# Patient Record
Sex: Male | Born: 1935 | Race: White | Hispanic: No | Marital: Married | Smoking: Former smoker
Health system: Southern US, Community
[De-identification: ages and names within clinical notes are randomized; demographics above are authoritative.]

## PROBLEM LIST (undated history)

## (undated) DIAGNOSIS — F329 Major depressive disorder, single episode, unspecified: Secondary | ICD-10-CM

## (undated) DIAGNOSIS — I10 Essential (primary) hypertension: Secondary | ICD-10-CM

## (undated) DIAGNOSIS — N184 Chronic kidney disease, stage 4 (severe): Secondary | ICD-10-CM

## (undated) DIAGNOSIS — I739 Peripheral vascular disease, unspecified: Secondary | ICD-10-CM

## (undated) DIAGNOSIS — N200 Calculus of kidney: Secondary | ICD-10-CM

## (undated) DIAGNOSIS — Z87891 Personal history of nicotine dependence: Secondary | ICD-10-CM

## (undated) DIAGNOSIS — I959 Hypotension, unspecified: Secondary | ICD-10-CM

## (undated) DIAGNOSIS — R001 Bradycardia, unspecified: Secondary | ICD-10-CM

## (undated) DIAGNOSIS — G4733 Obstructive sleep apnea (adult) (pediatric): Secondary | ICD-10-CM

## (undated) DIAGNOSIS — I714 Abdominal aortic aneurysm, without rupture: Secondary | ICD-10-CM

## (undated) DIAGNOSIS — E785 Hyperlipidemia, unspecified: Secondary | ICD-10-CM

## (undated) DIAGNOSIS — R911 Solitary pulmonary nodule: Secondary | ICD-10-CM

## (undated) DIAGNOSIS — H3530 Unspecified macular degeneration: Secondary | ICD-10-CM

## (undated) DIAGNOSIS — E291 Testicular hypofunction: Secondary | ICD-10-CM

## (undated) DIAGNOSIS — F32A Depression, unspecified: Secondary | ICD-10-CM

## (undated) DIAGNOSIS — I2510 Atherosclerotic heart disease of native coronary artery without angina pectoris: Secondary | ICD-10-CM

## (undated) DIAGNOSIS — Z9989 Dependence on other enabling machines and devices: Secondary | ICD-10-CM

## (undated) DIAGNOSIS — Z85828 Personal history of other malignant neoplasm of skin: Secondary | ICD-10-CM

## (undated) DIAGNOSIS — L97909 Non-pressure chronic ulcer of unspecified part of unspecified lower leg with unspecified severity: Secondary | ICD-10-CM

## (undated) DIAGNOSIS — D3000 Benign neoplasm of unspecified kidney: Secondary | ICD-10-CM

## (undated) HISTORY — PX: LUMBAR DISC SURGERY: SHX700

## (undated) HISTORY — PX: CORONARY STENT PLACEMENT: SHX1402

## (undated) HISTORY — DX: Calculus of kidney: N20.0

## (undated) HISTORY — DX: Hyperlipidemia, unspecified: E78.5

## (undated) HISTORY — DX: Essential (primary) hypertension: I10

## (undated) HISTORY — PX: TRANSURETHRAL RESECTION OF PROSTATE: SHX73

## (undated) HISTORY — DX: Personal history of other malignant neoplasm of skin: Z85.828

## (undated) HISTORY — DX: Personal history of nicotine dependence: Z87.891

## (undated) HISTORY — PX: BACK SURGERY: SHX140

## (undated) HISTORY — PX: PROSTATE SURGERY: SHX751

## (undated) HISTORY — PX: ABDOMINAL AORTIC ANEURYSM REPAIR: SUR1152

## (undated) HISTORY — DX: Non-pressure chronic ulcer of unspecified part of unspecified lower leg with unspecified severity: L97.909

## (undated) HISTORY — DX: Peripheral vascular disease, unspecified: I73.9

## (undated) HISTORY — DX: Abdominal aortic aneurysm, without rupture: I71.4

## (undated) HISTORY — DX: Major depressive disorder, single episode, unspecified: F32.9

## (undated) HISTORY — PX: CHOLECYSTECTOMY: SHX55

## (undated) HISTORY — DX: Benign neoplasm of unspecified kidney: D30.00

## (undated) HISTORY — DX: Unspecified macular degeneration: H35.30

## (undated) HISTORY — DX: Testicular hypofunction: E29.1

## (undated) HISTORY — DX: Depression, unspecified: F32.A

## (undated) HISTORY — DX: Atherosclerotic heart disease of native coronary artery without angina pectoris: I25.10

## (undated) HISTORY — DX: Solitary pulmonary nodule: R91.1

## (undated) HISTORY — DX: Obstructive sleep apnea (adult) (pediatric): G47.33

---

## 1988-04-30 HISTORY — PX: CORONARY ARTERY BYPASS GRAFT: SHX141

## 1998-03-10 ENCOUNTER — Other Ambulatory Visit: Admission: RE | Admit: 1998-03-10 | Discharge: 1998-03-10 | Payer: Self-pay | Admitting: Urology

## 1998-03-17 ENCOUNTER — Other Ambulatory Visit: Admission: RE | Admit: 1998-03-17 | Discharge: 1998-03-17 | Payer: Self-pay | Admitting: Urology

## 1998-04-05 ENCOUNTER — Ambulatory Visit (HOSPITAL_BASED_OUTPATIENT_CLINIC_OR_DEPARTMENT_OTHER): Admission: RE | Admit: 1998-04-05 | Discharge: 1998-04-05 | Payer: Self-pay | Admitting: Urology

## 1998-04-05 ENCOUNTER — Encounter: Payer: Self-pay | Admitting: Urology

## 1998-07-08 ENCOUNTER — Ambulatory Visit (HOSPITAL_BASED_OUTPATIENT_CLINIC_OR_DEPARTMENT_OTHER): Admission: RE | Admit: 1998-07-08 | Discharge: 1998-07-08 | Payer: Self-pay | Admitting: Urology

## 1999-01-23 ENCOUNTER — Other Ambulatory Visit: Admission: RE | Admit: 1999-01-23 | Discharge: 1999-01-24 | Payer: Self-pay | Admitting: Urology

## 1999-02-06 ENCOUNTER — Other Ambulatory Visit: Admission: RE | Admit: 1999-02-06 | Discharge: 1999-02-06 | Payer: Self-pay | Admitting: Urology

## 1999-05-02 ENCOUNTER — Other Ambulatory Visit: Admission: RE | Admit: 1999-05-02 | Discharge: 1999-05-02 | Payer: Self-pay | Admitting: Urology

## 1999-05-03 ENCOUNTER — Other Ambulatory Visit: Admission: RE | Admit: 1999-05-03 | Discharge: 1999-05-03 | Payer: Self-pay | Admitting: Urology

## 1999-06-13 ENCOUNTER — Encounter: Payer: Self-pay | Admitting: Urology

## 1999-06-13 ENCOUNTER — Encounter (INDEPENDENT_AMBULATORY_CARE_PROVIDER_SITE_OTHER): Payer: Self-pay | Admitting: *Deleted

## 1999-06-13 ENCOUNTER — Ambulatory Visit (HOSPITAL_BASED_OUTPATIENT_CLINIC_OR_DEPARTMENT_OTHER): Admission: RE | Admit: 1999-06-13 | Discharge: 1999-06-13 | Payer: Self-pay | Admitting: Urology

## 2000-04-30 DIAGNOSIS — I714 Abdominal aortic aneurysm, without rupture, unspecified: Secondary | ICD-10-CM

## 2000-04-30 HISTORY — DX: Abdominal aortic aneurysm, without rupture: I71.4

## 2000-04-30 HISTORY — DX: Abdominal aortic aneurysm, without rupture, unspecified: I71.40

## 2000-05-02 ENCOUNTER — Other Ambulatory Visit: Admission: RE | Admit: 2000-05-02 | Discharge: 2000-05-02 | Payer: Self-pay | Admitting: Urology

## 2000-05-09 ENCOUNTER — Other Ambulatory Visit: Admission: RE | Admit: 2000-05-09 | Discharge: 2000-05-09 | Payer: Self-pay | Admitting: Internal Medicine

## 2000-06-24 ENCOUNTER — Encounter (INDEPENDENT_AMBULATORY_CARE_PROVIDER_SITE_OTHER): Payer: Self-pay | Admitting: Specialist

## 2000-06-24 ENCOUNTER — Ambulatory Visit (HOSPITAL_COMMUNITY): Admission: RE | Admit: 2000-06-24 | Discharge: 2000-06-24 | Payer: Self-pay | Admitting: Gastroenterology

## 2001-02-07 ENCOUNTER — Encounter: Payer: Self-pay | Admitting: Cardiology

## 2001-02-07 ENCOUNTER — Ambulatory Visit (HOSPITAL_COMMUNITY): Admission: RE | Admit: 2001-02-07 | Discharge: 2001-02-07 | Payer: Self-pay | Admitting: Cardiology

## 2001-02-10 ENCOUNTER — Ambulatory Visit (HOSPITAL_COMMUNITY): Admission: RE | Admit: 2001-02-10 | Discharge: 2001-02-11 | Payer: Self-pay | Admitting: Cardiology

## 2001-02-11 ENCOUNTER — Encounter: Payer: Self-pay | Admitting: Cardiology

## 2001-06-01 ENCOUNTER — Ambulatory Visit (HOSPITAL_BASED_OUTPATIENT_CLINIC_OR_DEPARTMENT_OTHER): Admission: RE | Admit: 2001-06-01 | Discharge: 2001-06-01 | Payer: Self-pay | Admitting: Internal Medicine

## 2001-12-04 ENCOUNTER — Ambulatory Visit (HOSPITAL_BASED_OUTPATIENT_CLINIC_OR_DEPARTMENT_OTHER): Admission: RE | Admit: 2001-12-04 | Discharge: 2001-12-04 | Payer: Self-pay | Admitting: Plastic Surgery

## 2001-12-20 ENCOUNTER — Inpatient Hospital Stay (HOSPITAL_COMMUNITY): Admission: RE | Admit: 2001-12-20 | Discharge: 2001-12-26 | Payer: Self-pay | Admitting: Plastic Surgery

## 2003-06-08 ENCOUNTER — Encounter: Admission: RE | Admit: 2003-06-08 | Discharge: 2003-06-08 | Payer: Self-pay | Admitting: Internal Medicine

## 2003-06-16 ENCOUNTER — Encounter: Admission: RE | Admit: 2003-06-16 | Discharge: 2003-06-16 | Payer: Self-pay | Admitting: Sports Medicine

## 2003-06-30 ENCOUNTER — Ambulatory Visit (HOSPITAL_COMMUNITY): Admission: RE | Admit: 2003-06-30 | Discharge: 2003-06-30 | Payer: Self-pay | Admitting: Cardiology

## 2003-07-05 ENCOUNTER — Encounter (INDEPENDENT_AMBULATORY_CARE_PROVIDER_SITE_OTHER): Payer: Self-pay | Admitting: *Deleted

## 2003-07-05 ENCOUNTER — Inpatient Hospital Stay (HOSPITAL_COMMUNITY): Admission: RE | Admit: 2003-07-05 | Discharge: 2003-07-12 | Payer: Self-pay | Admitting: Vascular Surgery

## 2003-07-18 ENCOUNTER — Inpatient Hospital Stay (HOSPITAL_COMMUNITY): Admission: AD | Admit: 2003-07-18 | Discharge: 2003-07-19 | Payer: Self-pay | Admitting: *Deleted

## 2003-09-07 ENCOUNTER — Ambulatory Visit (HOSPITAL_COMMUNITY): Admission: RE | Admit: 2003-09-07 | Discharge: 2003-09-08 | Payer: Self-pay | Admitting: Cardiology

## 2004-02-28 ENCOUNTER — Encounter (HOSPITAL_COMMUNITY): Admission: RE | Admit: 2004-02-28 | Discharge: 2004-05-28 | Payer: Self-pay | Admitting: Cardiology

## 2004-03-03 ENCOUNTER — Ambulatory Visit: Payer: Self-pay | Admitting: Internal Medicine

## 2004-04-06 ENCOUNTER — Ambulatory Visit: Payer: Self-pay | Admitting: Internal Medicine

## 2004-05-29 ENCOUNTER — Encounter (HOSPITAL_COMMUNITY): Admission: RE | Admit: 2004-05-29 | Discharge: 2004-08-27 | Payer: Self-pay | Admitting: Cardiology

## 2004-07-17 ENCOUNTER — Ambulatory Visit: Payer: Self-pay | Admitting: Internal Medicine

## 2004-08-29 ENCOUNTER — Ambulatory Visit: Payer: Self-pay | Admitting: Internal Medicine

## 2004-08-30 IMAGING — CR DG CHEST 1V PORT
1 series · 1 of 1 positions shown · non-contrast
Comparison: 07/05/03 ([DATE])
 Endotracheal tube and nasogastric tube have been removed.

CLINICAL DATA: Post-op day #1, status post CABG.
 AP PORTABLE CHEST ?07/06/03 ([DATE])

[view not recorded]
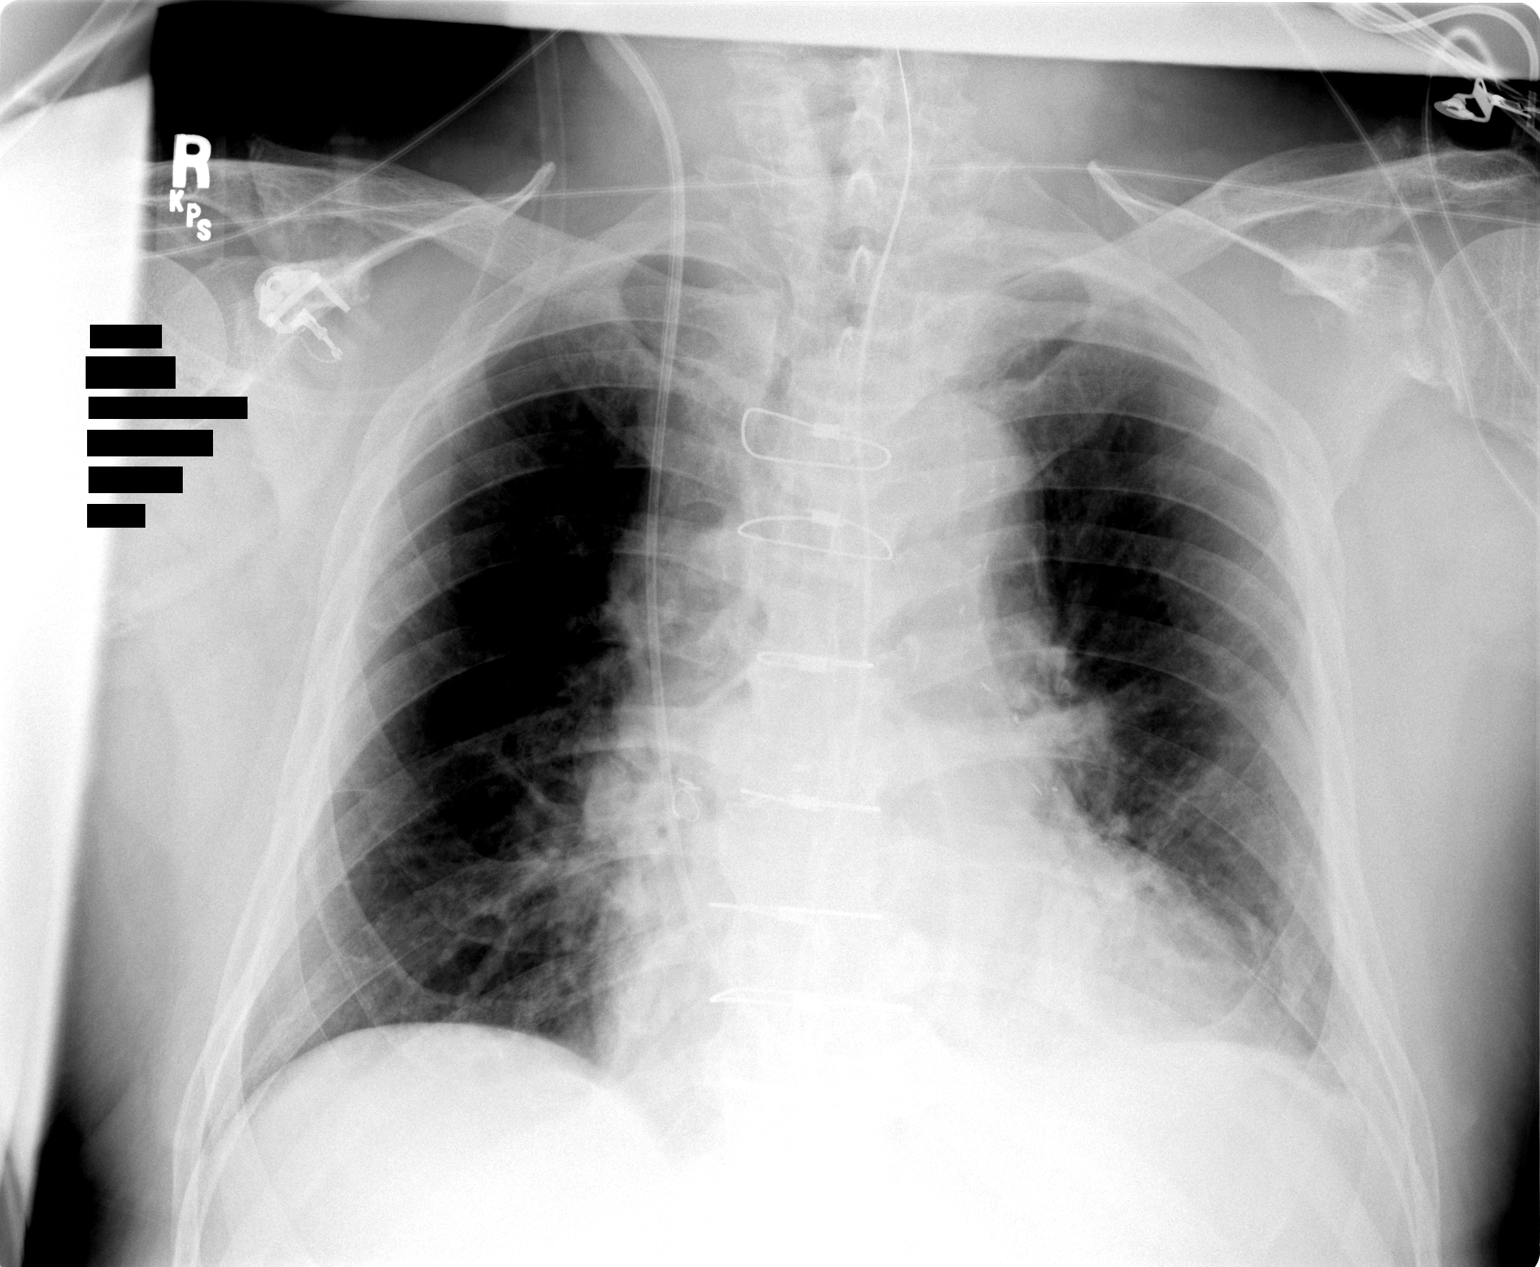

[1 of 1 positions shown; findings below may reference images not displayed]

Slight better aeration, left base.  The remainder of findings are unchanged.  
 IMPRESSION 
 Endotracheal tube and nasogastric tube have been removed. 
 Slight improvement in aeration, left base.

## 2004-08-31 IMAGING — CR DG CHEST 1V PORT
1 series · 1 of 1 positions shown · non-contrast
Comparison: 07/06/03.

CLINICAL DATA: Abdominal aortic aneurysm. 
 PORTABLE CHEST ([DATE] HOURS)

[view not recorded]
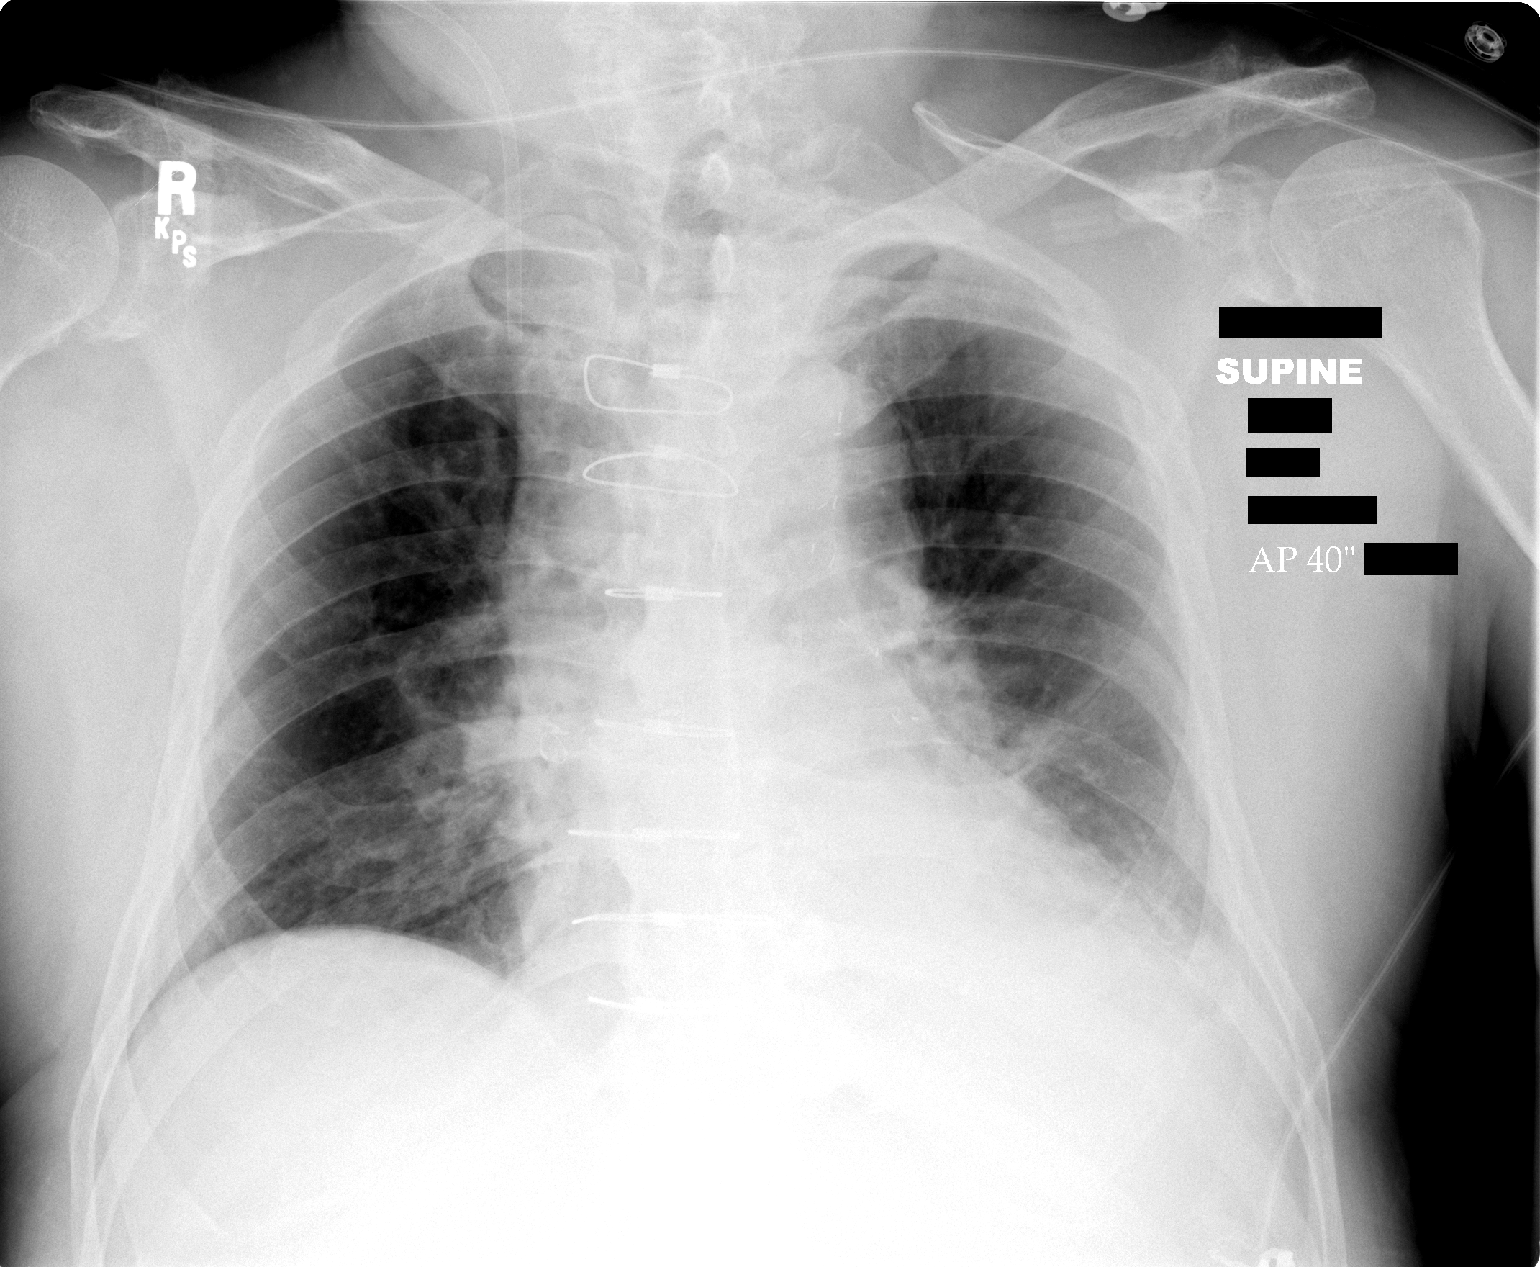

[1 of 1 positions shown; findings below may reference images not displayed]

FINDINGS: The NG tube and Swan-Ganz catheter have been removed with the introducer left in place.  The heart remains borderline enlarged.  Left basilar atelectasis is worse.  No pneumothoraces are seen.  A small left pleural effusion is likely present and unchanged.
 IMPRESSION
 1.  Removal of the NG tube and Swan-Ganz catheter.
 2.  Worsening atelectasis at the left base.

## 2004-09-11 ENCOUNTER — Ambulatory Visit: Payer: Self-pay | Admitting: Internal Medicine

## 2004-09-14 ENCOUNTER — Ambulatory Visit: Payer: Self-pay | Admitting: Cardiology

## 2004-09-21 ENCOUNTER — Ambulatory Visit: Payer: Self-pay | Admitting: Internal Medicine

## 2004-12-22 ENCOUNTER — Ambulatory Visit: Payer: Self-pay | Admitting: Internal Medicine

## 2005-02-09 ENCOUNTER — Ambulatory Visit: Payer: Self-pay | Admitting: Internal Medicine

## 2005-05-23 ENCOUNTER — Ambulatory Visit: Payer: Self-pay | Admitting: Cardiology

## 2005-05-23 ENCOUNTER — Inpatient Hospital Stay (HOSPITAL_COMMUNITY): Admission: AD | Admit: 2005-05-23 | Discharge: 2005-05-25 | Payer: Self-pay | Admitting: Cardiology

## 2005-06-04 ENCOUNTER — Ambulatory Visit: Payer: Self-pay | Admitting: Internal Medicine

## 2005-08-10 ENCOUNTER — Ambulatory Visit: Payer: Self-pay | Admitting: Internal Medicine

## 2005-10-02 ENCOUNTER — Ambulatory Visit: Payer: Self-pay | Admitting: Cardiology

## 2005-11-05 ENCOUNTER — Ambulatory Visit: Payer: Self-pay

## 2005-11-05 ENCOUNTER — Ambulatory Visit: Payer: Self-pay | Admitting: Cardiology

## 2005-11-13 ENCOUNTER — Ambulatory Visit: Payer: Self-pay | Admitting: Cardiology

## 2005-11-13 ENCOUNTER — Ambulatory Visit (HOSPITAL_COMMUNITY): Admission: RE | Admit: 2005-11-13 | Discharge: 2005-11-14 | Payer: Self-pay | Admitting: Cardiology

## 2005-11-27 ENCOUNTER — Ambulatory Visit: Payer: Self-pay | Admitting: Cardiology

## 2005-12-24 ENCOUNTER — Ambulatory Visit: Payer: Self-pay | Admitting: Cardiology

## 2006-01-01 ENCOUNTER — Encounter (HOSPITAL_COMMUNITY): Admission: RE | Admit: 2006-01-01 | Discharge: 2006-04-01 | Payer: Self-pay | Admitting: Cardiology

## 2006-01-30 ENCOUNTER — Ambulatory Visit: Payer: Self-pay | Admitting: Cardiology

## 2006-02-04 ENCOUNTER — Ambulatory Visit: Payer: Self-pay | Admitting: Internal Medicine

## 2006-02-11 ENCOUNTER — Ambulatory Visit: Payer: Self-pay

## 2006-02-11 ENCOUNTER — Ambulatory Visit: Payer: Self-pay | Admitting: Cardiology

## 2006-02-12 ENCOUNTER — Ambulatory Visit: Payer: Self-pay | Admitting: Cardiology

## 2006-02-18 ENCOUNTER — Ambulatory Visit: Payer: Self-pay | Admitting: Internal Medicine

## 2006-04-01 ENCOUNTER — Ambulatory Visit: Payer: Self-pay | Admitting: Internal Medicine

## 2006-04-02 ENCOUNTER — Encounter (HOSPITAL_COMMUNITY): Admission: RE | Admit: 2006-04-02 | Discharge: 2006-07-01 | Payer: Self-pay | Admitting: Cardiology

## 2006-04-26 ENCOUNTER — Ambulatory Visit: Payer: Self-pay | Admitting: Cardiology

## 2006-05-03 ENCOUNTER — Ambulatory Visit: Payer: Self-pay

## 2006-05-09 ENCOUNTER — Ambulatory Visit: Payer: Self-pay | Admitting: Cardiology

## 2006-05-09 LAB — CONVERTED CEMR LAB
Calcium: 9.5 mg/dL (ref 8.4–10.5)
Chloride: 101 meq/L (ref 96–112)
GFR calc non Af Amer: 33 mL/min
Glucose, Bld: 89 mg/dL (ref 70–99)
Hemoglobin: 16.1 g/dL (ref 13.0–17.0)
INR: 1 (ref 0.9–2.0)
Prothrombin Time: 12.4 s (ref 10.0–14.0)
RBC: 5.56 M/uL (ref 4.22–5.81)
RDW: 15.3 % — ABNORMAL HIGH (ref 11.5–14.6)
aPTT: 32.8 s (ref 26.5–36.5)

## 2006-05-10 ENCOUNTER — Ambulatory Visit: Payer: Self-pay | Admitting: Internal Medicine

## 2006-05-13 ENCOUNTER — Ambulatory Visit: Payer: Self-pay | Admitting: Cardiology

## 2006-05-13 LAB — CONVERTED CEMR LAB
BUN: 30 mg/dL — ABNORMAL HIGH (ref 6–23)
Calcium: 9.1 mg/dL (ref 8.4–10.5)
Chloride: 104 meq/L (ref 96–112)
GFR calc non Af Amer: 39 mL/min

## 2006-05-15 ENCOUNTER — Ambulatory Visit: Payer: Self-pay | Admitting: Cardiology

## 2006-05-15 ENCOUNTER — Inpatient Hospital Stay (HOSPITAL_BASED_OUTPATIENT_CLINIC_OR_DEPARTMENT_OTHER): Admission: RE | Admit: 2006-05-15 | Discharge: 2006-05-15 | Payer: Self-pay | Admitting: Cardiology

## 2006-05-17 ENCOUNTER — Ambulatory Visit: Payer: Self-pay | Admitting: Cardiology

## 2006-05-17 LAB — CONVERTED CEMR LAB
BUN: 23 mg/dL (ref 6–23)
Calcium: 8.9 mg/dL (ref 8.4–10.5)
Chloride: 100 meq/L (ref 96–112)
Creatinine, Ser: 1.8 mg/dL — ABNORMAL HIGH (ref 0.4–1.5)

## 2006-06-03 ENCOUNTER — Ambulatory Visit: Payer: Self-pay | Admitting: Cardiology

## 2006-06-10 ENCOUNTER — Ambulatory Visit: Payer: Self-pay | Admitting: Internal Medicine

## 2006-07-02 ENCOUNTER — Encounter (HOSPITAL_COMMUNITY): Admission: RE | Admit: 2006-07-02 | Discharge: 2006-09-30 | Payer: Self-pay | Admitting: Cardiology

## 2006-07-08 ENCOUNTER — Ambulatory Visit: Payer: Self-pay | Admitting: Internal Medicine

## 2006-07-17 ENCOUNTER — Ambulatory Visit: Payer: Self-pay | Admitting: Cardiology

## 2006-07-17 LAB — CONVERTED CEMR LAB
Bilirubin, Direct: 0.2 mg/dL (ref 0.0–0.3)
Cholesterol: 148 mg/dL (ref 0–200)
HDL: 29.8 mg/dL — ABNORMAL LOW (ref >39.0)
LDL Cholesterol: 78 mg/dL (ref 0–99)
Total Bilirubin: 0.9 mg/dL (ref 0.3–1.2)
Total CHOL/HDL Ratio: 5
Total Protein: 6.1 g/dL (ref 6.0–8.3)

## 2006-08-26 ENCOUNTER — Ambulatory Visit: Payer: Self-pay | Admitting: Internal Medicine

## 2006-08-26 LAB — CONVERTED CEMR LAB
ALT: 13 units/L (ref 0–40)
AST: 24 units/L (ref 0–37)
Albumin: 3.7 g/dL (ref 3.5–5.2)
Alkaline Phosphatase: 76 units/L (ref 39–117)
BUN: 28 mg/dL — ABNORMAL HIGH (ref 6–23)
Bilirubin, Direct: 0.2 mg/dL (ref 0.0–0.3)
CO2: 32 meq/L (ref 19–32)
Calcium: 9.2 mg/dL (ref 8.4–10.5)
Chloride: 104 meq/L (ref 96–112)
Cholesterol: 176 mg/dL (ref 0–200)
Creatinine, Ser: 1.7 mg/dL — ABNORMAL HIGH (ref 0.4–1.5)
Direct LDL: 90 mg/dL
GFR calc Af Amer: 51 mL/min
GFR calc non Af Amer: 42 mL/min
Glucose, Bld: 120 mg/dL — ABNORMAL HIGH (ref 70–99)
HDL: 30.7 mg/dL — ABNORMAL LOW (ref >39.0)
Potassium: 4.2 meq/L (ref 3.5–5.1)
Sodium: 142 meq/L (ref 135–145)
Testosterone: 210.51 ng/dL — ABNORMAL LOW (ref 350.00–890)
Total Bilirubin: 1 mg/dL (ref 0.3–1.2)
Total CHOL/HDL Ratio: 5.7
Total Protein: 6.4 g/dL (ref 6.0–8.3)
Triglycerides: 431 mg/dL (ref 0–149)
VLDL: 86 mg/dL — ABNORMAL HIGH (ref 0–40)

## 2006-09-30 ENCOUNTER — Encounter (HOSPITAL_COMMUNITY): Admission: RE | Admit: 2006-09-30 | Discharge: 2006-12-29 | Payer: Self-pay | Admitting: Cardiology

## 2006-10-23 ENCOUNTER — Ambulatory Visit: Payer: Self-pay | Admitting: Internal Medicine

## 2006-10-23 LAB — CONVERTED CEMR LAB
ALT: 19 units/L (ref 0–53)
AST: 25 units/L (ref 0–37)
Albumin: 3.7 g/dL (ref 3.5–5.2)
Alkaline Phosphatase: 73 units/L (ref 39–117)
BUN: 31 mg/dL — ABNORMAL HIGH (ref 6–23)
Bilirubin, Direct: 0.1 mg/dL (ref 0.0–0.3)
CO2: 30 meq/L (ref 19–32)
Calcium: 9.2 mg/dL (ref 8.4–10.5)
Chloride: 100 meq/L (ref 96–112)
Cholesterol: 197 mg/dL (ref 0–200)
Creatinine, Ser: 2.2 mg/dL — ABNORMAL HIGH (ref 0.4–1.5)
Direct LDL: 110.4 mg/dL
GFR calc Af Amer: 38 mL/min
GFR calc non Af Amer: 31 mL/min
Glucose, Bld: 100 mg/dL — ABNORMAL HIGH (ref 70–99)
HDL: 29.7 mg/dL — ABNORMAL LOW (ref >39.0)
Potassium: 4.1 meq/L (ref 3.5–5.1)
Sodium: 143 meq/L (ref 135–145)
Testosterone: 461.77 ng/dL (ref 350.00–890)
Total Bilirubin: 1 mg/dL (ref 0.3–1.2)
Total CHOL/HDL Ratio: 6.6
Total Protein: 6 g/dL (ref 6.0–8.3)
Triglycerides: 288 mg/dL (ref 0–149)
VLDL: 58 mg/dL — ABNORMAL HIGH (ref 0–40)

## 2006-10-28 DIAGNOSIS — I739 Peripheral vascular disease, unspecified: Secondary | ICD-10-CM

## 2006-10-28 DIAGNOSIS — I714 Abdominal aortic aneurysm, without rupture, unspecified: Secondary | ICD-10-CM | POA: Insufficient documentation

## 2006-10-28 DIAGNOSIS — I2510 Atherosclerotic heart disease of native coronary artery without angina pectoris: Secondary | ICD-10-CM | POA: Insufficient documentation

## 2006-10-28 DIAGNOSIS — M109 Gout, unspecified: Secondary | ICD-10-CM | POA: Insufficient documentation

## 2006-10-28 DIAGNOSIS — I10 Essential (primary) hypertension: Secondary | ICD-10-CM

## 2006-10-28 DIAGNOSIS — G4730 Sleep apnea, unspecified: Secondary | ICD-10-CM | POA: Insufficient documentation

## 2006-10-28 DIAGNOSIS — N259 Disorder resulting from impaired renal tubular function, unspecified: Secondary | ICD-10-CM | POA: Insufficient documentation

## 2006-11-22 ENCOUNTER — Telehealth (INDEPENDENT_AMBULATORY_CARE_PROVIDER_SITE_OTHER): Payer: Self-pay | Admitting: *Deleted

## 2006-11-25 ENCOUNTER — Ambulatory Visit: Payer: Self-pay | Admitting: Internal Medicine

## 2006-11-25 DIAGNOSIS — E291 Testicular hypofunction: Secondary | ICD-10-CM | POA: Insufficient documentation

## 2006-11-25 HISTORY — DX: Testicular hypofunction: E29.1

## 2006-11-26 ENCOUNTER — Telehealth: Payer: Self-pay | Admitting: Internal Medicine

## 2006-12-02 ENCOUNTER — Ambulatory Visit: Payer: Self-pay | Admitting: Cardiology

## 2006-12-16 ENCOUNTER — Telehealth: Payer: Self-pay | Admitting: Internal Medicine

## 2006-12-30 ENCOUNTER — Encounter (HOSPITAL_COMMUNITY): Admission: RE | Admit: 2006-12-30 | Discharge: 2007-03-30 | Payer: Self-pay | Admitting: Cardiology

## 2006-12-31 ENCOUNTER — Ambulatory Visit: Payer: Self-pay | Admitting: Internal Medicine

## 2007-01-02 LAB — CONVERTED CEMR LAB
Albumin: 4 g/dL (ref 3.5–5.2)
BUN: 24 mg/dL — ABNORMAL HIGH (ref 6–23)
Basophils Absolute: 0 10*3/uL (ref 0.0–0.1)
Basophils Relative: 0 % (ref 0.0–1.0)
CO2: 31 meq/L (ref 19–32)
Calcium: 10 mg/dL (ref 8.4–10.5)
Chloride: 108 meq/L (ref 96–112)
Creatinine, Ser: 1.4 mg/dL (ref 0.4–1.5)
Eosinophils Absolute: 0.3 10*3/uL (ref 0.0–0.6)
Eosinophils Relative: 3.8 % (ref 0.0–5.0)
GFR calc Af Amer: 64 mL/min
GFR calc non Af Amer: 53 mL/min
Glucose, Bld: 83 mg/dL (ref 70–99)
HCT: 50.6 % (ref 39.0–52.0)
Hemoglobin: 17.1 g/dL — ABNORMAL HIGH (ref 13.0–17.0)
Lymphocytes Relative: 20.7 % (ref 12.0–46.0)
MCHC: 33.9 g/dL (ref 30.0–36.0)
MCV: 90.8 fL (ref 78.0–100.0)
Monocytes Absolute: 1.1 10*3/uL — ABNORMAL HIGH (ref 0.2–0.7)
Monocytes Relative: 13.6 % — ABNORMAL HIGH (ref 3.0–11.0)
Neutro Abs: 4.9 10*3/uL (ref 1.4–7.7)
Neutrophils Relative %: 61.9 % (ref 43.0–77.0)
Phosphorus: 4.3 mg/dL (ref 2.3–4.6)
Platelets: 191 10*3/uL (ref 150–400)
Potassium: 5.3 meq/L — ABNORMAL HIGH (ref 3.5–5.1)
RBC: 5.57 M/uL (ref 4.22–5.81)
RDW: 14.9 % — ABNORMAL HIGH (ref 11.5–14.6)
Sodium: 147 meq/L — ABNORMAL HIGH (ref 135–145)
TSH: 2.83 microintl units/mL (ref 0.35–5.50)
WBC: 7.9 10*3/uL (ref 4.5–10.5)

## 2007-02-12 ENCOUNTER — Ambulatory Visit: Payer: Self-pay | Admitting: Internal Medicine

## 2007-02-17 ENCOUNTER — Encounter: Payer: Self-pay | Admitting: Internal Medicine

## 2007-03-11 ENCOUNTER — Ambulatory Visit: Payer: Self-pay | Admitting: Internal Medicine

## 2007-03-31 ENCOUNTER — Encounter (HOSPITAL_COMMUNITY): Admission: RE | Admit: 2007-03-31 | Discharge: 2007-04-29 | Payer: Self-pay | Admitting: Cardiology

## 2007-04-02 ENCOUNTER — Telehealth (INDEPENDENT_AMBULATORY_CARE_PROVIDER_SITE_OTHER): Payer: Self-pay | Admitting: *Deleted

## 2007-04-03 ENCOUNTER — Ambulatory Visit: Payer: Self-pay | Admitting: Internal Medicine

## 2007-04-04 LAB — CONVERTED CEMR LAB
ALT: 15 units/L (ref 0–53)
AST: 23 units/L (ref 0–37)
Albumin: 3.6 g/dL (ref 3.5–5.2)
Alkaline Phosphatase: 92 units/L (ref 39–117)
BUN: 17 mg/dL (ref 6–23)
Bilirubin, Direct: 0.2 mg/dL (ref 0.0–0.3)
CO2: 32 meq/L (ref 19–32)
Calcium: 9.4 mg/dL (ref 8.4–10.5)
Chloride: 104 meq/L (ref 96–112)
Cholesterol: 169 mg/dL (ref 0–200)
Creatinine, Ser: 1.5 mg/dL (ref 0.4–1.5)
Direct LDL: 108.6 mg/dL
GFR calc Af Amer: 59 mL/min
GFR calc non Af Amer: 48 mL/min
Glucose, Bld: 85 mg/dL (ref 70–99)
HDL: 21.7 mg/dL — ABNORMAL LOW (ref >39.0)
Potassium: 4.7 meq/L (ref 3.5–5.1)
Sodium: 140 meq/L (ref 135–145)
Testosterone: 219.56 ng/dL — ABNORMAL LOW (ref 350.00–890)
Total Bilirubin: 0.9 mg/dL (ref 0.3–1.2)
Total CHOL/HDL Ratio: 7.8
Total Protein: 5.8 g/dL — ABNORMAL LOW (ref 6.0–8.3)
Triglycerides: 227 mg/dL (ref 0–149)
VLDL: 45 mg/dL — ABNORMAL HIGH (ref 0–40)

## 2007-04-30 ENCOUNTER — Telehealth: Payer: Self-pay | Admitting: Internal Medicine

## 2007-04-30 ENCOUNTER — Encounter: Payer: Self-pay | Admitting: Internal Medicine

## 2007-05-02 ENCOUNTER — Telehealth: Payer: Self-pay | Admitting: Internal Medicine

## 2007-06-03 ENCOUNTER — Ambulatory Visit: Payer: Self-pay | Admitting: Cardiology

## 2007-06-05 ENCOUNTER — Ambulatory Visit: Payer: Self-pay | Admitting: Internal Medicine

## 2007-06-05 DIAGNOSIS — E785 Hyperlipidemia, unspecified: Secondary | ICD-10-CM

## 2007-06-06 LAB — CONVERTED CEMR LAB
ALT: 17 units/L (ref 0–53)
AST: 24 units/L (ref 0–37)
Bilirubin, Direct: 0.2 mg/dL (ref 0.0–0.3)
Cholesterol: 155 mg/dL (ref 0–200)
Total Bilirubin: 1.5 mg/dL — ABNORMAL HIGH (ref 0.3–1.2)
Total Protein: 6.1 g/dL (ref 6.0–8.3)

## 2007-06-16 ENCOUNTER — Telehealth: Payer: Self-pay | Admitting: Internal Medicine

## 2007-07-18 ENCOUNTER — Telehealth: Payer: Self-pay | Admitting: Internal Medicine

## 2007-08-20 ENCOUNTER — Ambulatory Visit: Payer: Self-pay | Admitting: Internal Medicine

## 2007-08-28 ENCOUNTER — Telehealth: Payer: Self-pay | Admitting: Internal Medicine

## 2007-10-03 ENCOUNTER — Ambulatory Visit: Payer: Self-pay | Admitting: Internal Medicine

## 2007-10-03 DIAGNOSIS — H3530 Unspecified macular degeneration: Secondary | ICD-10-CM | POA: Insufficient documentation

## 2007-10-03 LAB — CONVERTED CEMR LAB
Nitrite: NEGATIVE
Specific Gravity, Urine: 1.02
Urobilinogen, UA: NEGATIVE
pH: 5.5

## 2007-10-08 LAB — CONVERTED CEMR LAB
ALT: 17 units/L (ref 0–53)
Bilirubin, Direct: 0.1 mg/dL (ref 0.0–0.3)
CO2: 32 meq/L (ref 19–32)
Calcium: 9.1 mg/dL (ref 8.4–10.5)
Chloride: 99 meq/L (ref 96–112)
GFR calc non Af Amer: 48 mL/min
Sodium: 140 meq/L (ref 135–145)
Total Bilirubin: 0.8 mg/dL (ref 0.3–1.2)
Total CHOL/HDL Ratio: 4.9

## 2007-10-27 ENCOUNTER — Ambulatory Visit: Payer: Self-pay | Admitting: Internal Medicine

## 2007-11-05 ENCOUNTER — Encounter: Payer: Self-pay | Admitting: Internal Medicine

## 2007-11-12 ENCOUNTER — Encounter: Payer: Self-pay | Admitting: Internal Medicine

## 2007-12-16 ENCOUNTER — Ambulatory Visit: Payer: Self-pay | Admitting: Internal Medicine

## 2007-12-23 ENCOUNTER — Ambulatory Visit: Payer: Self-pay | Admitting: Cardiology

## 2007-12-23 LAB — CONVERTED CEMR LAB
CO2: 30 meq/L (ref 19–32)
Calcium: 8.8 mg/dL (ref 8.4–10.5)
Chloride: 108 meq/L (ref 96–112)
Glucose, Bld: 91 mg/dL (ref 70–99)
Sodium: 142 meq/L (ref 135–145)

## 2008-01-01 ENCOUNTER — Ambulatory Visit: Payer: Self-pay | Admitting: Cardiology

## 2008-01-01 LAB — CONVERTED CEMR LAB
BUN: 17 mg/dL (ref 6–23)
CO2: 28 meq/L (ref 19–32)
Chloride: 103 meq/L (ref 96–112)
GFR calc Af Amer: 54 mL/min
Glucose, Bld: 133 mg/dL — ABNORMAL HIGH (ref 70–99)
Potassium: 4.2 meq/L (ref 3.5–5.1)

## 2008-02-10 ENCOUNTER — Ambulatory Visit: Payer: Self-pay | Admitting: Internal Medicine

## 2008-02-10 DIAGNOSIS — N320 Bladder-neck obstruction: Secondary | ICD-10-CM

## 2008-02-18 ENCOUNTER — Encounter: Payer: Self-pay | Admitting: Internal Medicine

## 2008-03-02 ENCOUNTER — Telehealth: Payer: Self-pay | Admitting: Internal Medicine

## 2008-03-12 ENCOUNTER — Encounter: Payer: Self-pay | Admitting: Internal Medicine

## 2008-03-15 ENCOUNTER — Telehealth: Payer: Self-pay | Admitting: Internal Medicine

## 2008-04-02 ENCOUNTER — Telehealth: Payer: Self-pay | Admitting: Internal Medicine

## 2008-04-14 ENCOUNTER — Ambulatory Visit: Payer: Self-pay | Admitting: Internal Medicine

## 2008-05-03 ENCOUNTER — Ambulatory Visit: Payer: Self-pay | Admitting: Internal Medicine

## 2008-05-06 ENCOUNTER — Telehealth (INDEPENDENT_AMBULATORY_CARE_PROVIDER_SITE_OTHER): Payer: Self-pay | Admitting: *Deleted

## 2008-05-10 ENCOUNTER — Telehealth (INDEPENDENT_AMBULATORY_CARE_PROVIDER_SITE_OTHER): Payer: Self-pay | Admitting: *Deleted

## 2008-05-11 ENCOUNTER — Encounter: Payer: Self-pay | Admitting: Internal Medicine

## 2008-05-18 ENCOUNTER — Ambulatory Visit: Payer: Self-pay | Admitting: Pulmonary Disease

## 2008-05-28 ENCOUNTER — Ambulatory Visit: Payer: Self-pay | Admitting: Pulmonary Disease

## 2008-06-08 ENCOUNTER — Ambulatory Visit: Payer: Self-pay | Admitting: Pulmonary Disease

## 2008-06-10 ENCOUNTER — Telehealth: Payer: Self-pay | Admitting: Internal Medicine

## 2008-06-11 ENCOUNTER — Telehealth (INDEPENDENT_AMBULATORY_CARE_PROVIDER_SITE_OTHER): Payer: Self-pay | Admitting: *Deleted

## 2008-06-18 ENCOUNTER — Ambulatory Visit: Payer: Self-pay | Admitting: Cardiovascular Disease

## 2008-07-26 ENCOUNTER — Encounter: Payer: Self-pay | Admitting: Internal Medicine

## 2008-08-23 ENCOUNTER — Ambulatory Visit: Payer: Self-pay | Admitting: Internal Medicine

## 2008-09-04 ENCOUNTER — Telehealth: Payer: Self-pay | Admitting: Internal Medicine

## 2008-09-04 ENCOUNTER — Ambulatory Visit: Payer: Self-pay | Admitting: Cardiology

## 2008-09-04 ENCOUNTER — Encounter: Payer: Self-pay | Admitting: Cardiology

## 2008-09-04 ENCOUNTER — Inpatient Hospital Stay (HOSPITAL_COMMUNITY): Admission: EM | Admit: 2008-09-04 | Discharge: 2008-09-08 | Payer: Self-pay | Admitting: Emergency Medicine

## 2008-09-07 ENCOUNTER — Encounter: Payer: Self-pay | Admitting: Cardiology

## 2008-09-09 ENCOUNTER — Encounter: Payer: Self-pay | Admitting: Cardiology

## 2008-09-10 ENCOUNTER — Ambulatory Visit: Payer: Self-pay | Admitting: Cardiology

## 2008-09-10 LAB — CONVERTED CEMR LAB
BUN: 23 mg/dL (ref 6–23)
CO2: 29 meq/L (ref 19–32)
Chloride: 108 meq/L (ref 96–112)
Creatinine, Ser: 1.8 mg/dL — ABNORMAL HIGH (ref 0.4–1.5)
Glucose, Bld: 139 mg/dL — ABNORMAL HIGH (ref 70–99)
Potassium: 4 meq/L (ref 3.5–5.1)

## 2008-09-14 ENCOUNTER — Encounter: Payer: Self-pay | Admitting: Cardiology

## 2008-09-29 DIAGNOSIS — I5032 Chronic diastolic (congestive) heart failure: Secondary | ICD-10-CM

## 2008-09-30 ENCOUNTER — Ambulatory Visit: Payer: Self-pay | Admitting: Cardiology

## 2008-10-05 ENCOUNTER — Telehealth: Payer: Self-pay | Admitting: Internal Medicine

## 2008-10-21 ENCOUNTER — Encounter: Payer: Self-pay | Admitting: Internal Medicine

## 2008-10-29 ENCOUNTER — Telehealth: Payer: Self-pay | Admitting: Cardiology

## 2008-11-04 ENCOUNTER — Encounter: Payer: Self-pay | Admitting: Internal Medicine

## 2008-12-14 ENCOUNTER — Telehealth: Payer: Self-pay | Admitting: Internal Medicine

## 2008-12-16 ENCOUNTER — Encounter: Payer: Self-pay | Admitting: Cardiology

## 2008-12-16 ENCOUNTER — Encounter: Payer: Self-pay | Admitting: Internal Medicine

## 2008-12-23 ENCOUNTER — Telehealth: Payer: Self-pay | Admitting: Cardiology

## 2008-12-30 ENCOUNTER — Other Ambulatory Visit: Payer: Self-pay | Admitting: Urology

## 2008-12-30 ENCOUNTER — Observation Stay (HOSPITAL_COMMUNITY): Admission: AD | Admit: 2008-12-30 | Discharge: 2008-12-31 | Payer: Self-pay | Admitting: Urology

## 2009-01-11 ENCOUNTER — Ambulatory Visit: Payer: Self-pay | Admitting: Cardiology

## 2009-01-20 ENCOUNTER — Ambulatory Visit: Payer: Self-pay | Admitting: Internal Medicine

## 2009-01-26 ENCOUNTER — Ambulatory Visit: Payer: Self-pay | Admitting: Internal Medicine

## 2009-03-23 ENCOUNTER — Ambulatory Visit: Payer: Self-pay | Admitting: Internal Medicine

## 2009-03-24 ENCOUNTER — Telehealth: Payer: Self-pay | Admitting: Family Medicine

## 2009-03-25 ENCOUNTER — Telehealth: Payer: Self-pay | Admitting: Cardiology

## 2009-04-03 LAB — CONVERTED CEMR LAB
ALT: 12 units/L (ref 0–53)
AST: 20 units/L (ref 0–37)
Basophils Relative: 0.4 % (ref 0.0–3.0)
Bilirubin, Direct: 0.2 mg/dL (ref 0.0–0.3)
CO2: 31 meq/L (ref 19–32)
Calcium: 8.9 mg/dL (ref 8.4–10.5)
Chloride: 104 meq/L (ref 96–112)
Cholesterol: 157 mg/dL (ref 0–200)
Creatinine, Ser: 1.6 mg/dL — ABNORMAL HIGH (ref 0.4–1.5)
Eosinophils Relative: 1.9 % (ref 0.0–5.0)
HCT: 46.1 % (ref 39.0–52.0)
HDL: 28.2 mg/dL — ABNORMAL LOW (ref >39.00)
Hemoglobin: 15.5 g/dL (ref 13.0–17.0)
Lymphs Abs: 1.2 10*3/uL (ref 0.7–4.0)
Monocytes Relative: 14.1 % — ABNORMAL HIGH (ref 3.0–12.0)
Neutro Abs: 5.4 10*3/uL (ref 1.4–7.7)
RBC: 5.15 M/uL (ref 4.22–5.81)
Sodium: 142 meq/L (ref 135–145)
TSH: 2.35 microintl units/mL (ref 0.35–5.50)
Total Bilirubin: 1.1 mg/dL (ref 0.3–1.2)
Triglycerides: 150 mg/dL — ABNORMAL HIGH (ref 0.0–149.0)
Uric Acid, Serum: 9.5 mg/dL — ABNORMAL HIGH (ref 4.0–7.8)
WBC: 7.8 10*3/uL (ref 4.5–10.5)

## 2009-04-05 ENCOUNTER — Encounter (INDEPENDENT_AMBULATORY_CARE_PROVIDER_SITE_OTHER): Payer: Self-pay | Admitting: *Deleted

## 2009-04-08 ENCOUNTER — Telehealth: Payer: Self-pay | Admitting: Internal Medicine

## 2009-04-26 ENCOUNTER — Encounter: Payer: Self-pay | Admitting: Cardiology

## 2009-04-26 ENCOUNTER — Telehealth: Payer: Self-pay | Admitting: Cardiology

## 2009-04-26 DIAGNOSIS — R911 Solitary pulmonary nodule: Secondary | ICD-10-CM

## 2009-04-26 HISTORY — DX: Solitary pulmonary nodule: R91.1

## 2009-04-28 ENCOUNTER — Ambulatory Visit: Payer: Self-pay | Admitting: Internal Medicine

## 2009-04-28 ENCOUNTER — Encounter: Payer: Self-pay | Admitting: Cardiology

## 2009-05-17 ENCOUNTER — Encounter: Payer: Self-pay | Admitting: Internal Medicine

## 2009-06-15 ENCOUNTER — Encounter: Payer: Self-pay | Admitting: Cardiology

## 2009-06-22 ENCOUNTER — Ambulatory Visit: Payer: Self-pay | Admitting: Internal Medicine

## 2009-07-01 ENCOUNTER — Ambulatory Visit: Payer: Self-pay | Admitting: Cardiology

## 2009-07-08 ENCOUNTER — Ambulatory Visit: Payer: Self-pay | Admitting: Cardiology

## 2009-07-08 LAB — CONVERTED CEMR LAB
BUN: 22 mg/dL
CO2: 30 meq/L
Calcium: 8.8 mg/dL
Chloride: 108 meq/L
Creatinine, Ser: 1.7 mg/dL — ABNORMAL HIGH
GFR calc non Af Amer: 41.62 mL/min
Glucose, Bld: 106 mg/dL — ABNORMAL HIGH
Potassium: 4 meq/L
Sodium: 142 meq/L

## 2009-08-18 ENCOUNTER — Ambulatory Visit: Payer: Self-pay | Admitting: Internal Medicine

## 2009-08-24 ENCOUNTER — Telehealth: Payer: Self-pay | Admitting: Cardiology

## 2009-09-09 ENCOUNTER — Ambulatory Visit: Payer: Self-pay | Admitting: Internal Medicine

## 2009-09-15 ENCOUNTER — Telehealth: Payer: Self-pay | Admitting: Cardiology

## 2009-09-21 ENCOUNTER — Encounter (INDEPENDENT_AMBULATORY_CARE_PROVIDER_SITE_OTHER): Payer: Self-pay | Admitting: *Deleted

## 2009-11-11 ENCOUNTER — Encounter: Payer: Self-pay | Admitting: Internal Medicine

## 2009-11-17 ENCOUNTER — Ambulatory Visit: Payer: Self-pay | Admitting: Cardiology

## 2009-11-18 ENCOUNTER — Telehealth: Payer: Self-pay | Admitting: Cardiology

## 2009-11-18 LAB — CONVERTED CEMR LAB
BUN: 28 mg/dL — ABNORMAL HIGH (ref 6–23)
CO2: 28 meq/L (ref 19–32)
Calcium: 9.2 mg/dL (ref 8.4–10.5)
Chloride: 105 meq/L (ref 96–112)
Creatinine, Ser: 1.6 mg/dL — ABNORMAL HIGH (ref 0.4–1.5)
GFR calc non Af Amer: 45.91 mL/min (ref >60)
Glucose, Bld: 127 mg/dL — ABNORMAL HIGH (ref 70–99)
Potassium: 4 meq/L (ref 3.5–5.1)
Sodium: 144 meq/L (ref 135–145)

## 2009-11-21 ENCOUNTER — Telehealth: Payer: Self-pay | Admitting: *Deleted

## 2009-12-13 ENCOUNTER — Telehealth: Payer: Self-pay | Admitting: Internal Medicine

## 2009-12-14 ENCOUNTER — Ambulatory Visit: Payer: Self-pay | Admitting: Internal Medicine

## 2010-01-16 ENCOUNTER — Telehealth: Payer: Self-pay | Admitting: Internal Medicine

## 2010-01-25 ENCOUNTER — Ambulatory Visit: Payer: Self-pay | Admitting: Internal Medicine

## 2010-02-24 ENCOUNTER — Encounter: Payer: Self-pay | Admitting: Cardiology

## 2010-02-24 ENCOUNTER — Ambulatory Visit: Payer: Self-pay | Admitting: Cardiology

## 2010-03-08 ENCOUNTER — Ambulatory Visit: Payer: Self-pay | Admitting: Cardiology

## 2010-03-13 ENCOUNTER — Emergency Department (HOSPITAL_COMMUNITY): Admission: EM | Admit: 2010-03-13 | Discharge: 2010-03-13 | Payer: Self-pay | Admitting: Emergency Medicine

## 2010-03-14 ENCOUNTER — Telehealth: Payer: Self-pay | Admitting: Internal Medicine

## 2010-03-15 ENCOUNTER — Ambulatory Visit: Payer: Self-pay | Admitting: Internal Medicine

## 2010-03-15 DIAGNOSIS — T148XXA Other injury of unspecified body region, initial encounter: Secondary | ICD-10-CM | POA: Insufficient documentation

## 2010-03-20 ENCOUNTER — Ambulatory Visit: Payer: Self-pay | Admitting: Internal Medicine

## 2010-03-20 DIAGNOSIS — M542 Cervicalgia: Secondary | ICD-10-CM

## 2010-03-27 ENCOUNTER — Ambulatory Visit: Payer: Self-pay | Admitting: Internal Medicine

## 2010-03-31 ENCOUNTER — Telehealth: Payer: Self-pay | Admitting: Internal Medicine

## 2010-03-31 ENCOUNTER — Telehealth: Payer: Self-pay | Admitting: Cardiology

## 2010-03-31 LAB — CONVERTED CEMR LAB
Albumin: 3.5 g/dL (ref 3.5–5.2)
Alkaline Phosphatase: 69 units/L (ref 39–117)
Basophils Relative: 0.6 % (ref 0.0–3.0)
CO2: 31 meq/L (ref 19–32)
Chloride: 104 meq/L (ref 96–112)
Eosinophils Relative: 2.9 % (ref 0.0–5.0)
HCT: 45.9 % (ref 39.0–52.0)
Hemoglobin: 15.6 g/dL (ref 13.0–17.0)
LDL Cholesterol: 102 mg/dL — ABNORMAL HIGH (ref 0–99)
MCV: 87.8 fL (ref 78.0–100.0)
Monocytes Absolute: 0.8 10*3/uL (ref 0.1–1.0)
Neutro Abs: 4.2 10*3/uL (ref 1.4–7.7)
Neutrophils Relative %: 61.9 % (ref 43.0–77.0)
Potassium: 4.4 meq/L (ref 3.5–5.1)
RBC: 5.23 M/uL (ref 4.22–5.81)
Sodium: 142 meq/L (ref 135–145)
Total Bilirubin: 1 mg/dL (ref 0.3–1.2)
Total CHOL/HDL Ratio: 5
Triglycerides: 137 mg/dL (ref 0.0–149.0)
WBC: 6.8 10*3/uL (ref 4.5–10.5)

## 2010-04-27 ENCOUNTER — Telehealth: Payer: Self-pay | Admitting: Family Medicine

## 2010-04-28 ENCOUNTER — Ambulatory Visit
Admission: RE | Admit: 2010-04-28 | Discharge: 2010-04-28 | Payer: Self-pay | Source: Home / Self Care | Attending: Family Medicine | Admitting: Family Medicine

## 2010-05-08 ENCOUNTER — Ambulatory Visit
Admission: RE | Admit: 2010-05-08 | Discharge: 2010-05-08 | Payer: Self-pay | Source: Home / Self Care | Attending: Internal Medicine | Admitting: Internal Medicine

## 2010-05-09 ENCOUNTER — Encounter: Payer: Self-pay | Admitting: Internal Medicine

## 2010-05-15 ENCOUNTER — Ambulatory Visit
Admission: RE | Admit: 2010-05-15 | Discharge: 2010-05-15 | Payer: Self-pay | Source: Home / Self Care | Attending: Internal Medicine | Admitting: Internal Medicine

## 2010-05-20 ENCOUNTER — Encounter: Payer: Self-pay | Admitting: Internal Medicine

## 2010-05-21 ENCOUNTER — Encounter: Payer: Self-pay | Admitting: Internal Medicine

## 2010-05-21 DIAGNOSIS — Z951 Presence of aortocoronary bypass graft: Secondary | ICD-10-CM | POA: Insufficient documentation

## 2010-05-21 DIAGNOSIS — I2510 Atherosclerotic heart disease of native coronary artery without angina pectoris: Secondary | ICD-10-CM

## 2010-05-22 ENCOUNTER — Ambulatory Visit
Admission: RE | Admit: 2010-05-22 | Discharge: 2010-05-22 | Payer: Self-pay | Source: Home / Self Care | Attending: Internal Medicine | Admitting: Internal Medicine

## 2010-05-28 LAB — CONVERTED CEMR LAB
Basophils Absolute: 0.1 10*3/uL (ref 0.0–0.1)
Basophils Relative: 0.8 % (ref 0.0–3.0)
CO2: 34 meq/L — ABNORMAL HIGH (ref 19–32)
Calcium: 8.8 mg/dL (ref 8.4–10.5)
Calcium: 9.2 mg/dL (ref 8.4–10.5)
Chloride: 105 meq/L (ref 96–112)
Creatinine, Ser: 1.9 mg/dL — ABNORMAL HIGH (ref 0.4–1.5)
Eosinophils Absolute: 0.2 10*3/uL (ref 0.0–0.7)
Eosinophils Absolute: 0.3 10*3/uL (ref 0.0–0.7)
GFR calc non Af Amer: 48.18 mL/min (ref >60)
Glucose, Bld: 87 mg/dL (ref 70–99)
Glucose, Bld: 94 mg/dL (ref 70–99)
HCT: 46.1 % (ref 39.0–52.0)
Hemoglobin: 14.8 g/dL (ref 13.0–17.0)
Lymphocytes Relative: 20.4 % (ref 12.0–46.0)
Lymphs Abs: 1.4 10*3/uL (ref 0.7–4.0)
MCHC: 33.4 g/dL (ref 30.0–36.0)
MCHC: 34.3 g/dL (ref 30.0–36.0)
MCV: 87.8 fL (ref 78.0–100.0)
MCV: 89.1 fL (ref 78.0–100.0)
Monocytes Absolute: 0.9 10*3/uL (ref 0.1–1.0)
Monocytes Absolute: 1 10*3/uL (ref 0.1–1.0)
Neutro Abs: 4.7 10*3/uL (ref 1.4–7.7)
Neutrophils Relative %: 64 % (ref 43.0–77.0)
Neutrophils Relative %: 66.3 % (ref 43.0–77.0)
Platelets: 170 10*3/uL (ref 150.0–400.0)
Potassium: 3.9 meq/L (ref 3.5–5.1)
RBC: 4.96 M/uL (ref 4.22–5.81)
RDW: 15.2 % — ABNORMAL HIGH (ref 11.5–14.6)
RDW: 15.5 % — ABNORMAL HIGH (ref 11.5–14.6)
Sodium: 139 meq/L (ref 135–145)

## 2010-05-30 ENCOUNTER — Emergency Department (HOSPITAL_COMMUNITY)
Admission: EM | Admit: 2010-05-30 | Discharge: 2010-05-30 | Payer: Self-pay | Source: Home / Self Care | Admitting: Emergency Medicine

## 2010-05-30 ENCOUNTER — Encounter (HOSPITAL_BASED_OUTPATIENT_CLINIC_OR_DEPARTMENT_OTHER): Admission: RE | Admit: 2010-05-30 | Payer: Self-pay | Source: Home / Self Care | Admitting: General Surgery

## 2010-05-30 NOTE — Progress Notes (Signed)
Summary: Refill Alprazolam  Phone Note From Pharmacy   Caller: [facility] [phone]* Summary of Call: Refill Alprazolam 0.5 mg tab  Last office visit: 11/17/09 Last refill: 06-13-09 Ok to refill? [facility] phone number is [phone] Initial call taken by: Kathrynn Speed CMA,  January 16, 2010 4:23 PM    Prescriptions: ALPRAZOLAM 0.5 MG TABS (ALPRAZOLAM) Take 1 tablet by mouth two times a day as needed  #60 x 2   Entered by:   Kern Reap CMA (AAMA)   Authorized by:   Birdie Sons MD   Signed by:   Kern Reap CMA (AAMA) on 01/17/2010   Method used:   Telephoned to ...       [facility] [phone]* (retail)       [id number] [place], [place]  [address]       Ph: [phone]       Fax: [phone]   RxID:   [id number]

## 2010-05-30 NOTE — Assessment & Plan Note (Signed)
Summary: testosterone inj/cjr  Nurse Visit   Allergies: No Known Drug Allergies  Medication Administration  Injection # 1:    Medication: Testosterone Cypionat 200mg  ing    Diagnosis: TESTOSTERONE DEFICIENCY (ICD-257.2)    Route: IM    Exp Date: 06/29/2011    Lot #: [id number]    Mfr: watson    Patient tolerated injection without complications    Given by: Gladis Riffle, RN (June 22, 2009 1:39 PM)  Orders Added: 1)  Admin of patients own med IM/SQ [96372M]   Medication Administration  Injection # 1:    Medication: Testosterone Cypionat 200mg  ing    Diagnosis: TESTOSTERONE DEFICIENCY (ICD-257.2)    Route: IM    Exp Date: 06/29/2011    Lot #: [id number]    Mfr: watson    Patient tolerated injection without complications    Given by: Gladis Riffle, RN (June 22, 2009 1:39 PM)  Orders Added: 1)  Admin of patients own med IM/SQ [96372M]   Appended Document: testosterone inj/cjr given RUQ gluteus

## 2010-05-30 NOTE — Assessment & Plan Note (Signed)
Summary: testosterone inj/pt coming in at 11:45/cjr  Nurse Visit   Allergies: No Known Drug Allergies  Medication Administration  Injection # 1:    Medication: Testosterone Cypionat 200mg  ing    Diagnosis: TESTOSTERONE DEFICIENCY (ICD-257.2)    Route: IM    Site: RUOQ gluteus    Exp Date: 06/29/2011    Lot #: [id number]    Mfr: watson    Patient tolerated injection without complications    Given by: Gladis Riffle, RN (Sep 09, 2009 12:06 PM)  Orders Added: 1)  Admin of patients own med IM/SQ [96372M]   Medication Administration  Injection # 1:    Medication: Testosterone Cypionat 200mg  ing    Diagnosis: TESTOSTERONE DEFICIENCY (ICD-257.2)    Route: IM    Site: RUOQ gluteus    Exp Date: 06/29/2011    Lot #: [id number]    Mfr: watson    Patient tolerated injection without complications    Given by: Gladis Riffle, RN (Sep 09, 2009 12:06 PM)  Orders Added: 1)  Admin of patients own med IM/SQ [phone]

## 2010-05-30 NOTE — Letter (Signed)
Summary: Colonoscopy-Changed to Office Visit Letter  [facility]  [address] [place], [place] [address]   Phone: [phone]  Fax: [phone]      Sep 21, 2009 MRN: [id number]   ALASDAIR KLEVE [address] [place], [place]  [address]   Dear Mr. BOULET,   According to our records, it is time for you to schedule a Colonoscopy. However, after reviewing your medical record, I feel that an office visit would be most appropriate to more completely evaluate you and determine your need for a repeat procedure.  Please call [phone] (option #2) at your convenience to schedule an office visit. If you have any questions, concerns, or feel that this letter is in error, we would appreciate your call.   Sincerely,  Iva Boop, M.D.  [facility] [phone]

## 2010-05-30 NOTE — Assessment & Plan Note (Signed)
Summary: Suture removal/cb   Vital Signs:  Patient profile:   77 year old male Temp:     98.5 degrees F oral  Vitals Entered By: Alfred Levins, CMA (March 20, 2010 11:10 AM) CC: remove staples   Primary Care Provider:  Dr Birdie Sons  CC:  remove staples.  History of Present Illness: patient comes in for staple removal. Patient had recent trauma. He fell down an escalator. Was evaluated at the emergency department. I have reviewed images. Patient continues to complain of neck discomfort. CT Scan of the neck shows multiple arthritic areas. He also has spinal stenosis of the cervical spine. In regards to the wounds patient does not complain of any significant pain. He still has some oozing from the wounds on his arms.  Patient denies chest pain, shortness of breath. He continues to struggle with his vision. He has followup with [facility] next week. No other complaints in a complete review of systems.  Current Problems (verified): 1)  Laceration  (ICD-879.8) 2)  Constipation  (ICD-564.00) 3)  Pulmonary Nodule, Right Middle Lobe  (ICD-518.89) 4)  Diastolic Heart Failure, Chronic  (ICD-428.32) 5)  Abdominal Aneurysm Repair.  () 6)  Prostatectomy, Transurethral, Hx of  (ICD-V15.9) 7)  Coronary Artery Bypass Graft, Hx of  (ICD-V45.81) 8)  Cholecystectomy, Hx of  (ICD-V45.79) 9)  Hypokalemia  (ICD-276.8) 10)  Cough  (ICD-786.2) 11)  Bladder Outlet Obstruction  (ICD-596.0) 12)  Macular Degeneration  (ICD-362.50) 13)  Hyperlipidemia  (ICD-272.4) 14)  Testosterone Deficiency  (ICD-257.2) 15)  Sleep Apnea  (ICD-780.57) 16)  A A A  (ICD-441.4) 17)  Peripheral Vascular Disease  (ICD-443.9) 18)  Renal Insufficiency  (ICD-588.9) 19)  Hypertension  (ICD-401.9) 20)  Gout  (ICD-274.9) 21)  Coronary Artery Disease  (ICD-414.00)  Current Medications (verified): 1)  Alprazolam 0.5 Mg Tabs (Alprazolam) .... Take 1 Tablet By Mouth Two Times A Day As Needed 2)   Nitroglycerin 0.4 Mg Subl (Nitroglycerin) .... Place 1 Tablet Under Tongue As Directed 3)  Wellbutrin Sr 150 Mg  Tb12 (Bupropion Hcl) .... One By Mouth Twice Daily 4)  Simvastatin 20 Mg  Tabs (Simvastatin) .... Once Daily 5)  Cozaar 50 Mg Tabs (Losartan Potassium) .... Take One Tablet By Mouth Daily 6)  Cpap .... At Bedtime 7)  Imdur 30 Mg Xr24h-Tab (Isosorbide Mononitrate) .Marland Kitchen.. 1 Tab Once Daily 8)  Furosemide 80 Mg Tabs (Furosemide) .Marland Kitchen.. 1 Tab 3 X Weekly 9)  Testosterone Cypionate 200 Mg/ml Oil (Testosterone Cypionate) .... Inject 1ml Intramusculary Every Month As Directed. 10)  Allopurinol 100 Mg Tabs (Allopurinol) .... Take 1 Tablet By Mouth Once A Day 11)  Miralax   Powd (Polyethylene Glycol 3350) .Marland Kitchen.. 17g By Mouth Once Daily As Needed Constipation 12)  Amlodipine Besylate 10 Mg Tabs (Amlodipine Besylate) .... Take One Tablet By Mouth Daily 13)  Carvedilol 6.25 Mg Tabs (Carvedilol) .... Take One Tablet By Mouth Twice A Day 14)  Micro-K 10 Meq Cr-Caps (Potassium Chloride) .Marland Kitchen.. 1 Tab 3 X Weekly 15)  Hydrocodone-Acetaminophen 5-325 Mg Tabs (Hydrocodone-Acetaminophen) .Marland Kitchen.. 1 By Mouth Up To 4 Times Per Day As Needed For Pain  Allergies (verified): No Known Drug Allergies  Past History:  Past Medical History: Last updated: 09/29/2008 Coronary artery disease--no known MI  1990 Gout Hypertension Renal insufficiency FUO-duration 15 mo resolved AAA  2002  3.2 cm macular degeneration bladder, followed by Dr. Earlene Plater Peripheral vascular disease OSA   CPAP Hyperlipidemia 1. diastolic congestive heart failure 2. Status post aortocoronary  bypass surgery in 1990 , S/P multiple PCIs with occlusion of all SVGs and only patent LIMA at cath 08/2008 3.  EF 55% by echo 08/2008 5. History of abdominal aortic aneurysm with repair. 6. Macular degeneration. 7. Hyperlipidemia. 8. Sleep apnea on continuous positive airway pressure. 9. Depression. 10.Chronic kidney disease, stage III with a glomerular  filtration rate     of 37 at discharge. 11.Remote history of ethyl alcohol and tobacco use. 12.History of renal adenoma and nephrolithiasis. 13.History of basal cell skin cancer. 14.Status post back surgery. 15.Possible depression symptoms, follow up with primary care. 16.Hypertension.    Past Surgical History: Last updated: 09/29/2008 * ABDOMINAL ANEURYSM REPAIR. PROSTATECTOMY, TRANSURETHRAL, HX OF (ICD-V15.9) CORONARY ARTERY BYPASS GRAFT, HX OF (ICD-V45.81) CHOLECYSTECTOMY, HX OF (ICD-V45.79)   disc surgery X5 TUNA stent 12/02  Family History: Last updated: 05/18/2008 father-emphysema pat uncles x 2 -heart disease pat aunt-rheumatism  Social History: Last updated: 05/18/2008 Married with children. Former Smoker.  Quit smoking 1990.  Smoked x 52 years upto 2ppd. 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  Risk Factors: Exercise: no (11/25/2006)  Risk Factors: Smoking Status: quit > 6 months (08/18/2009)  Physical Exam  General:  chronically ill-appearing, elderly male in no acute distress. HEENT exam patient has multiple sutures in his scalp. There is significant caked blood on his scalp. No facial trauma. Neck without lymphadenopathy or thyromegaly. He has decreased range of motion of his neck with flexion, extension, rotation. Limitation in all of these is secondary to pain. Chest is clear to auscultation cardiac exam S1-S2 are regular. Abdominal exam overweight, tumescence. Extremities no clubbing or cyanosis.   Impression & Recommendations:  Problem # 1:  LACERATION (ICD-879.8) patient with polytrauma. I reviewed CAT scan of the head and neck. I think given the neck discomfort he needs further evaluation. I'll refer to physical therapy. I don't want to pursue any other medical interventions at this time. The patient's arm wounds  . These are mostly abrasions. Patient's wife will continue to change these daily. I'll followup with him in one  week.  multiple staples removed . Total time 30 minutes with patient and wife cleanup his wound, removing staples and discussing need for physical therapy. Orders: Coban Wrap,  <3 in/yd (Z6109) Coban Wrap, 3-5 in/yd. [phone])  Problem # 2:  NECK PAIN (ICD-723.1)  patient needs further treatment. We'll further physical therapy. His updated medication list for this problem includes:    Hydrocodone-acetaminophen 5-325 Mg Tabs (Hydrocodone-acetaminophen) .Marland Kitchen... 1 by mouth up to 4 times per day as needed for pain  Orders: Physical Therapy Referral (PT)  Complete Medication List: 1)  Alprazolam 0.5 Mg Tabs (Alprazolam) .... Take 1 tablet by mouth two times a day as needed 2)  Nitroglycerin 0.4 Mg Subl (Nitroglycerin) .... Place 1 tablet under tongue as directed 3)  Wellbutrin Sr 150 Mg Tb12 (Bupropion hcl) .... One by mouth twice daily 4)  Simvastatin 20 Mg Tabs (Simvastatin) .... Once daily 5)  Cozaar 50 Mg Tabs (Losartan potassium) .... Take one tablet by mouth daily 6)  Cpap  .... At bedtime 7)  Imdur 30 Mg Xr24h-tab (Isosorbide mononitrate) .Marland Kitchen.. 1 tab once daily 8)  Furosemide 80 Mg Tabs (Furosemide) .Marland Kitchen.. 1 tab 3 x weekly 9)  Testosterone Cypionate 200 Mg/ml Oil (Testosterone cypionate) .... Inject 1ml intramusculary every month as directed. 10)  Allopurinol 100 Mg Tabs (Allopurinol) .... Take 1 tablet by mouth once a day 11)  Miralax Powd (Polyethylene glycol 3350) .Marland KitchenMarland KitchenMarland Kitchen  17g by mouth once daily as needed constipation 12)  Amlodipine Besylate 10 Mg Tabs (Amlodipine besylate) .... Take one tablet by mouth daily 13)  Carvedilol 6.25 Mg Tabs (Carvedilol) .... Take one tablet by mouth twice a day 14)  Micro-k 10 Meq Cr-caps (Potassium chloride) .Marland Kitchen.. 1 tab 3 x weekly 15)  Hydrocodone-acetaminophen 5-325 Mg Tabs (Hydrocodone-acetaminophen) .Marland Kitchen.. 1 by mouth up to 4 times per day as needed for pain  Other Orders: Admin of patients own med IM/SQ (01027O)   Medication Administration  Injection  # 1:    Medication: Testosterone Cypionat 200mg  ing    Diagnosis: TESTOSTERONE DEFICIENCY (ICD-257.2)    Route: IM    Site: LUOQ gluteus    Exp Date: 3/13    Lot #: [id number]    Mfr: Abraxis    Patient tolerated injection without complications    Given by: Alfred Levins, CMA (March 20, 2010 11:55 AM)  Orders Added: 1)  Admin of patients own med IM/SQ [96372M] 2)  Coban Wrap,  <3 in/yd [H4742] 3)  Coban Wrap, 3-5 in/yd. [V9563] 4)  Physical Therapy Referral [PT] 5)  Est. Patient Level IV [87564]

## 2010-05-30 NOTE — Progress Notes (Signed)
Summary: abx issues, nausea, dizziness  Phone Note Call from Patient Call back at Home Phone [phone]   Caller: Patient Call For: Birdie Sons MD Summary of Call: PT is having trouble with doxycycline  100mg   please advise Initial call taken by: Heron Sabins,  March 31, 2010 12:54 PM  Follow-up for Phone Call        Attempted call back several times, no answer, no machine Sid Falcon LPN  March 31, 2010 3:35 PM  Finally contacted wife, pt started med Monday, took 2 and began to get nauseated and dizzy.  Has only been able to take 1 or 2 since then, today wife dumped 1/2 the med out and he did ok today taking 1/2 dose.  Pt leg is still very red [facility]  Follow-up by: Sid Falcon LPN,  March 31, 2010 5:04 PM  Additional Follow-up for Phone Call Additional follow up Details #1::        per dr Lovell Sheehan- he would have to come for dr Lovell Sheehan is access- best thing to do is to go to urgent care--other alternative is to continue to take medicine and take with food. call if not better when dr swords returns Additional Follow-up by: Willy Eddy, LPN,  March 31, 2010 5:16 PM

## 2010-05-30 NOTE — Progress Notes (Signed)
Summary: lab results  Phone Note Call from Patient Call back at Home Phone [phone]   Caller: Patient Reason for Call: Talk to Nurse, Lab or Test Results Summary of Call: pt calling re blood work results Initial call taken by: Roe Coombs,  March 31, 2010 11:37 AM  Follow-up for Phone Call        I called and spoke with the pt's wife. Follow-up by: Sherri Rad, RN, BSN,  March 31, 2010 11:57 AM

## 2010-05-30 NOTE — Assessment & Plan Note (Signed)
Summary: 4 MONTH   Visit Type:  Follow-up Referring Provider:  Dr Birdie Sons Primary Provider:  Dr Birdie Sons  CC:  edema.  History of Present Illness: The patient is 77 years old and returns for management of CAD and CHF. He had remote bypass surgery and has had multiple PCI procedures. His last catheterization was in May of 2010 at which time only his LIMA graft was patent. He did have fairly good collaterals to his other vessels and he had normal LV function. He did have elevation of the pulley wedge pressure related to diastolic heart failure and was diuresed for this.  He has been doing fairly well from a cardiac standpoint since his last visit. He has had no swelling and no chest pain.  His other major problem is macular degeneration. He is being followed at [facility] for this. He is unable to read and unable to drive.  Current Medications (verified): 1)  Alprazolam 0.5 Mg Tabs (Alprazolam) .... Take 1 Tablet By Mouth Two Times A Day As Needed 2)  Nitroglycerin 0.4 Mg Subl (Nitroglycerin) .... Place 1 Tablet Under Tongue As Directed 3)  Wellbutrin Sr 150 Mg  Tb12 (Bupropion Hcl) .... One By Mouth Twice Daily 4)  Simvastatin 20 Mg  Tabs (Simvastatin) .... Once Daily 5)  Cozaar 50 Mg Tabs (Losartan Potassium) .... Take One Tablet By Mouth Daily 6)  Cpap .... At Bedtime 7)  Imdur 30 Mg Xr24h-Tab (Isosorbide Mononitrate) .Marland Kitchen.. 1 Tab Once Daily 8)  Furosemide 80 Mg Tabs (Furosemide) .... Take One Tablet By Mouth Daily. 9)  Testosterone Cypionate 200 Mg/ml Oil (Testosterone Cypionate) .... Inject 1ml Intramusculary Every Month As Directed. 10)  Allopurinol 100 Mg Tabs (Allopurinol) .... Take 1 Tablet By Mouth Once A Day 11)  Miralax   Powd (Polyethylene Glycol 3350) .Marland Kitchen.. 17g By Mouth Once Daily As Needed Constipation 12)  Amlodipine Besylate 10 Mg Tabs (Amlodipine Besylate) .... Take One Tablet By Mouth Daily 13)  Prevision Eye Cap .Marland Kitchen.. 1 Tab Once Daily  Allergies  (verified): No Known Drug Allergies  Past History:  Past Medical History: Reviewed history from 09/29/2008 and no changes required. Coronary artery disease--no known MI  1990 Gout Hypertension Renal insufficiency FUO-duration 15 mo resolved AAA  2002  3.2 cm macular degeneration bladder, followed by Dr. Earlene Plater Peripheral vascular disease OSA   CPAP Hyperlipidemia 1. diastolic congestive heart failure 2. Status post aortocoronary bypass surgery in 1990 , S/P multiple PCIs with occlusion of all SVGs and only patent LIMA at cath 08/2008 3.  EF 55% by echo 08/2008 5. History of abdominal aortic aneurysm with repair. 6. Macular degeneration. 7. Hyperlipidemia. 8. Sleep apnea on continuous positive airway pressure. 9. Depression. 10.Chronic kidney disease, stage III with a glomerular filtration rate     of 37 at discharge. 11.Remote history of ethyl alcohol and tobacco use. 12.History of renal adenoma and nephrolithiasis. 13.History of basal cell skin cancer. 14.Status post back surgery. 15.Possible depression symptoms, follow up with primary care. 16.Hypertension.    Review of Systems       ROS is negative except as outlined in HPI.   Vital Signs:  Patient profile:   77 year old male Height:      70 inches Weight:      208 pounds BMI:     29.95 Pulse rate:   94 / minute BP sitting:   157 / 85  (left arm) Cuff size:   regular  Vitals Entered By: Burnett Kanaris, CNA (  November 17, 2009 11:43 AM)  Physical Exam  Additional Exam:  Gen. Well-nourished, in no distress   Neck: No JVD, thyroid not enlarged, no carotid bruits Lungs: No tachypnea, clear without rales, rhonchi or wheezes Cardiovascular: Rhythm regular, PMI not displaced,  heart sounds  normal, no murmurs or gallops, 1+ bilateral peripheral edema, pulses normal in all 4 extremities. Abdomen: BS normal, abdomen soft and non-tender without masses or organomegaly, no hepatosplenomegaly. MS: No deformities, no cyanosis  or clubbing   Neuro:  No focal sns   Skin:  no lesions    Impression & Recommendations:  Problem # 1:  CORONARY ARTERY BYPASS GRAFT, HX OF (ICD-V45.81) He has had previous bypass surgery and previous PCI procedures and at last catheterization had one remaining patent graft which was the LIMA graft to the LAD. A good collaterals and normal LV function. He's had no chest pain is from a clear stable.  Problem # 2:  DIASTOLIC HEART FAILURE, CHRONIC (ICD-428.32) He has a history of diastolic heart failure but appears euvolemic today. He has been off his Coreg and we will resume that at 6.25 b.i.d. He's also been off his potassium and we will resume that at 10 mEq daily. We will get a BMP today. The following medications were removed from the medication list:    Carvedilol 6.25 Mg Tabs (Carvedilol) .Marland Kitchen... Take one tablet by mouth twice a day His updated medication list for this problem includes:    Nitroglycerin 0.4 Mg Subl (Nitroglycerin) .Marland Kitchen... Place 1 tablet under tongue as directed    Cozaar 50 Mg Tabs (Losartan potassium) .Marland Kitchen... Take one tablet by mouth daily    Imdur 30 Mg Xr24h-tab (Isosorbide mononitrate) .Marland Kitchen... 1 tab once daily    Furosemide 80 Mg Tabs (Furosemide) .Marland Kitchen... Take one tablet by mouth daily.    Amlodipine Besylate 10 Mg Tabs (Amlodipine besylate) .Marland Kitchen... Take one tablet by mouth daily  Problem # 3:  HYPERTENSION (ICD-401.9) His blood pressure is borderline elevated today and we will resume the Coreg. The following medications were removed from the medication list:    Carvedilol 6.25 Mg Tabs (Carvedilol) .Marland Kitchen... Take one tablet by mouth twice a day His updated medication list for this problem includes:    Cozaar 50 Mg Tabs (Losartan potassium) .Marland Kitchen... Take one tablet by mouth daily    Furosemide 80 Mg Tabs (Furosemide) .Marland Kitchen... Take one tablet by mouth daily.    Amlodipine Besylate 10 Mg Tabs (Amlodipine besylate) .Marland Kitchen... Take one tablet by mouth daily  Appended Document: 4  MONTH    Clinical Lists Changes  Medications: Added new medication of CARVEDILOL 6.25 MG TABS (CARVEDILOL) Take one tablet by mouth twice a day - Signed Added new medication of MICRO-K 10 MEQ CR-CAPS (POTASSIUM CHLORIDE) take one capsule by mouth once daily - Signed Orders: Added new Test order of TLB-BMP (Basic Metabolic Panel-BMET) (80048-METABOL) - Signed Observations: Added new observation of PI CARDIO: Your physician recommends that you have lab work today: bmet (414.01) Restart Coreg (carvediolol) at 6.25mg  two times a day - if you get home and discover you have been taking this, then call and let us know. Take potassium once daily. Your physician recommends that you schedule a follow-up appointment in: 4 months. (11/17/2009 12:41)       Patient Instructions: 1)  Your physician recommends that you have lab work today: bmet (414.01) 2)  Restart Coreg (carvediolol) at 6.25mg  two times a day - if you get home and discover you have been  taking this, then call and let us know. 3)  Take potassium once daily. 4)  Your physician recommends that you schedule a follow-up appointment in: 4 months.

## 2010-05-30 NOTE — Letter (Signed)
Summary: [facility]  [facility]   Imported By: Maryln Gottron 06/03/2009 09:52:57  _____________________________________________________________________  External Attachment:    Type:   Image     Comment:   External Document

## 2010-05-30 NOTE — Progress Notes (Signed)
Summary: rx lift chair  Phone Note From Other Clinic   Caller: ann,[facility] [phone] Summary of Call: Needs Rx to go with certificate of need for lift chair, codes used are 443.9, 414.0, 119.1 on cert of need.  Fax to [phone].  Done.   Initial call taken by: Rudy Jew, RN,  December 13, 2009 10:54 AM     Appended Document: rx lift chair Fax number wrong.  Use [phone].  Done.

## 2010-05-30 NOTE — Progress Notes (Signed)
Summary: Change to Cozaar?   Phone Note From Pharmacy   Caller: Janeice Robinson- [facility] Summary of Call: Per [facility], the pt is on Benicar and it is very expensive, they would like to know if Dr. Juanda Chance would consider changing the pt to Cozaar (losartan). I explained I would review with Dr. Juanda Chance and call them on friday when he is back in the office. Sherri Rad, RN, BSN  August 24, 2009 4:09 PM   Follow-up for Phone Call       Follow-up by: Lenoria Farrier, MD, [facility],  August 25, 2009 1:36 PM  Additional Follow-up for Phone Call Additional follow up Details #1::        OK to switch to Cozaar 50. BB Additional Follow-up by: Lenoria Farrier, MD, [facility],  August 25, 2009 1:37 PM    Additional Follow-up for Phone Call Additional follow up Details #2::    RX sent in to [facility]. I have left the pt a message to call in order to let him know about the change. Sherri Rad, RN, BSN  August 26, 2009 4:00 PM  I called and spoke with the pt's wife. She is aware of the medication change. Follow-up by: Sherri Rad, RN, BSN,  Aug 29, 2009 6:13 PM  New/Updated Medications: COZAAR 50 MG TABS (LOSARTAN POTASSIUM) Take one tablet by mouth daily Prescriptions: COZAAR 50 MG TABS (LOSARTAN POTASSIUM) Take one tablet by mouth daily  #30 x 6   Entered by:   Sherri Rad, RN, BSN   Authorized by:   Lenoria Farrier, MD, [facility]   Signed by:   Sherri Rad, RN, BSN on 08/26/2009   Method used:   Electronically to        [facility] [phone]* (retail)       [address] [place], [place]  [address]       Ph: [phone]       Fax: [phone]   RxID:   [id number]

## 2010-05-30 NOTE — Assessment & Plan Note (Signed)
Summary: FLU SHOT // RS  Nurse Visit  CC: Flu shot./kb   Allergies: No Known Drug Allergies  Orders Added: 1)  Flu Vaccine 82yrs + MEDICARE PATIENTS [Q2039] 2)  Administration Flu vaccine - MCR [G0008]                 Flu Vaccine Consent Questions     Do you have a history of severe allergic reactions to this vaccine? no    Any prior history of allergic reactions to egg and/or gelatin? no    Do you have a sensitivity to the preservative Thimersol? no    Do you have a past history of Guillan-Barre Syndrome? no    Do you currently have an acute febrile illness? no    Have you ever had a severe reaction to latex? no    Vaccine information given and explained to patient? yes    Are you currently pregnant? no    Lot Number:[id number]   Exp Date:10/28/2010   Site Given  Left Deltoid IMu

## 2010-05-30 NOTE — Progress Notes (Signed)
Summary: Pt fell down escalator yesterday. Needing bandages changed out  Phone Note Call from Patient Call back at Home Phone [phone] Call back at Work Phone [phone]   Caller: Spouse-Jerry Summary of Call: Pt fell down escalator from top to bottom yesterday.   Pt has abrasions,lacerations and bruises all over. Pt also has staples in his head.  Pt is needing to come in to get bandages changed later this week. Pls advise.       Initial call taken by: Lucy Antigua,  March 14, 2010 10:27 AM  Follow-up for Phone Call        pt will come in to get bandages changed tomorrow Follow-up by: Alfred Levins, CMA,  March 14, 2010 11:15 AM

## 2010-05-30 NOTE — Progress Notes (Signed)
Summary: [facility] requesting certification of need  Phone Note Call from Patient   Caller: Patient Call For: Birdie Sons MD Summary of Call: [facility] Delorise Shiner) requesting faxed copy of certification of need for lift chair. Form printed from chart, faxed, confirmation received Initial call taken by: Sid Falcon LPN,  November 21, 2009 3:37 PM

## 2010-05-30 NOTE — Assessment & Plan Note (Signed)
Summary: rov   Visit Type:  rov Referring Johneisha Broaden:  Dr Birdie Sons Primary Jai Bear:  Dr Birdie Sons  CC:  edema/feet...denies any other complaints today.  History of Present Illness: The patient is 77 years old and returns for management of CAD and CHF. He had remote bypass surgery and has had multiple PCI procedures. His last catheterization was in May of 2010 at which time only his LIMA graft was patent. He did have fairly good collaterals to his other vessels and he had normal LV function. He did have elevation of the pulley wedge pressure related to diastolic heart failure and was diuresed for this.  He has been doing fairly well from a cardiac standpoint since his last visit. He has had no swelling and no chest pain.  His other major problem is macular degeneration. He is being followed at [facility] for this. He is unable to read and unable to drive.  Current Medications (verified): 1)  Alprazolam 0.5 Mg Tabs (Alprazolam) .... Take 1 Tablet By Mouth Two Times A Day As Needed 2)  Nitroglycerin 0.4 Mg Subl (Nitroglycerin) .... Place 1 Tablet Under Tongue As Directed 3)  Wellbutrin Sr 150 Mg  Tb12 (Bupropion Hcl) .... One By Mouth Twice Daily 4)  Simvastatin 20 Mg  Tabs (Simvastatin) .... Once Daily 5)  Cozaar 50 Mg Tabs (Losartan Potassium) .... Take One Tablet By Mouth Daily 6)  Cpap .... At Bedtime 7)  Imdur 30 Mg Xr24h-Tab (Isosorbide Mononitrate) .Marland Kitchen.. 1 Tab Once Daily 8)  Furosemide 80 Mg Tabs (Furosemide) .Marland Kitchen.. 1 Tab 3 X Weekly 9)  Testosterone Cypionate 200 Mg/ml Oil (Testosterone Cypionate) .... Inject 1ml Intramusculary Every Month As Directed. 10)  Allopurinol 100 Mg Tabs (Allopurinol) .... Take 1 Tablet By Mouth Once A Day 11)  Miralax   Powd (Polyethylene Glycol 3350) .Marland Kitchen.. 17g By Mouth Once Daily As Needed Constipation 12)  Amlodipine Besylate 10 Mg Tabs (Amlodipine Besylate) .... Take One Tablet By Mouth Daily 13)  Carvedilol 6.25 Mg Tabs (Carvedilol) .... Take  One Tablet By Mouth Twice A Day 14)  Micro-K 10 Meq Cr-Caps (Potassium Chloride) .Marland Kitchen.. 1 Tab 3 X Weekly  Allergies (verified): No Known Drug Allergies  Past History:  Past Medical History: Reviewed history from 09/29/2008 and no changes required. Coronary artery disease--no known MI  1990 Gout Hypertension Renal insufficiency FUO-duration 15 mo resolved AAA  2002  3.2 cm macular degeneration bladder, followed by Dr. Earlene Plater Peripheral vascular disease OSA   CPAP Hyperlipidemia 1. diastolic congestive heart failure 2. Status post aortocoronary bypass surgery in 1990 , S/P multiple PCIs with occlusion of all SVGs and only patent LIMA at cath 08/2008 3.  EF 55% by echo 08/2008 5. History of abdominal aortic aneurysm with repair. 6. Macular degeneration. 7. Hyperlipidemia. 8. Sleep apnea on continuous positive airway pressure. 9. Depression. 10.Chronic kidney disease, stage III with a glomerular filtration rate     of 37 at discharge. 11.Remote history of ethyl alcohol and tobacco use. 12.History of renal adenoma and nephrolithiasis. 13.History of basal cell skin cancer. 14.Status post back surgery. 15.Possible depression symptoms, follow up with primary care. 16.Hypertension.    Review of Systems       ROS is negative except as outlined in HPI.   Vital Signs:  Patient profile:   77 year old male Height:      70 inches Weight:      212 pounds BMI:     30.53 Pulse rate:   66 / minute  Pulse rhythm:   irregular BP sitting:   122 / 72  (left arm) Cuff size:   large  Vitals Entered By: Danielle Rankin, CMA (February 24, 2010 11:04 AM)  Physical Exam  Additional Exam:  Gen. Well-nourished, in no distress   Neck: No JVD, thyroid not enlarged, no carotid bruits Lungs: No tachypnea, clear without rales, rhonchi or wheezes Cardiovascular: Rhythm regular, PMI not displaced,  heart sounds  normal, no murmurs or gallops, 1-2+ bilateral peripheral edema, pulses normal in all 4  extremities. Abdomen: BS normal, abdomen soft and non-tender without masses or organomegaly, no hepatosplenomegaly. MS: No deformities, no cyanosis or clubbing   Neuro:  No focal sns   Skin:  no lesions    Impression & Recommendations:  Problem # 1:  CORONARY ARTERY BYPASS GRAFT, HX OF (ICD-V45.81) He has had previous bypass surgery and all his vein grafts are occluded and he has a patent LIMA graft. He's had no chest pain and is well compensated. We will continue current medications.  Problem # 2:  DIASTOLIC HEART FAILURE, CHRONIC (ICD-428.32)  He has a history of diastolic heart failure. He appears euvolemic today. We will continue current medications. His updated medication list for this problem includes:    Nitroglycerin 0.4 Mg Subl (Nitroglycerin) .Marland Kitchen... Place 1 tablet under tongue as directed    Cozaar 50 Mg Tabs (Losartan potassium) .Marland Kitchen... Take one tablet by mouth daily    Imdur 30 Mg Xr24h-tab (Isosorbide mononitrate) .Marland Kitchen... 1 tab once daily    Furosemide 80 Mg Tabs (Furosemide) .Marland Kitchen... 1 tab 3 x weekly    Amlodipine Besylate 10 Mg Tabs (Amlodipine besylate) .Marland Kitchen... Take one tablet by mouth daily    Carvedilol 6.25 Mg Tabs (Carvedilol) .Marland Kitchen... Take one tablet by mouth twice a day  Orders: EKG w/ Interpretation (93000)  Problem # 3:  HYPERLIPIDEMIA (ICD-272.4) He has not had a lipid profile in the year so we'll plan to get fasting lab studies. His updated medication list for this problem includes:    Simvastatin 20 Mg Tabs (Simvastatin) ..... Once daily  Patient Instructions: 1)  Your physician wants you to follow-up in: 6 months with Dr. Shirlee Latch.  You will receive a reminder letter in the mail two months in advance. If you don't receive a letter, please call our office to schedule the follow-up appointment. 2)  Return for FASTING labwork on a day that is good for you: lipid/liver/bmet/cbc (414.01;272.2). 3)  Your physician recommends that you continue on your current medications as  directed. Please refer to the Current Medication list given to you today.

## 2010-05-30 NOTE — Medication Information (Signed)
Summary: [facility]   [facility] Review   Imported By: Roderic Ovens 01/30/2010 12:27:19  _____________________________________________________________________  External Attachment:    Type:   Image     Comment:   External Document

## 2010-05-30 NOTE — Assessment & Plan Note (Signed)
Summary: APPT FOR MONDAY PER DR SWORDS // RS   Vital Signs:  Patient profile:   77 year old male Temp:     97.9 degrees F oral BP sitting:   124 / 82  (left arm) Cuff size:   large  Vitals Entered By: Alfred Levins, CMA (March 27, 2010 11:35 AM) CC: f/u from fall 2wks ago   Primary Care Provider:  Dr Birdie Sons  CC:  f/u from fall 2wks ago.  History of Present Illness: f/u trauma---he is doing much better has developed erythema around abrasions---left leg no fever or chills, no sweats  3 days ago had headache---resolved.   CAD---no CP, SOB, PND  All other systems reviewed and were negative except for ongoing complaints of fatigue, generalized weakness, gait instability.  Current Medications (verified): 1)  Alprazolam 0.5 Mg Tabs (Alprazolam) .... Take 1 Tablet By Mouth Two Times A Day As Needed 2)  Nitroglycerin 0.4 Mg Subl (Nitroglycerin) .... Place 1 Tablet Under Tongue As Directed 3)  Wellbutrin Sr 150 Mg  Tb12 (Bupropion Hcl) .... One By Mouth Twice Daily 4)  Simvastatin 20 Mg  Tabs (Simvastatin) .... Once Daily 5)  Cozaar 50 Mg Tabs (Losartan Potassium) .... Take One Tablet By Mouth Daily 6)  Cpap .... At Bedtime 7)  Imdur 30 Mg Xr24h-Tab (Isosorbide Mononitrate) .Marland Kitchen.. 1 Tab Once Daily 8)  Furosemide 80 Mg Tabs (Furosemide) .Marland Kitchen.. 1 Tab 3 X Weekly 9)  Testosterone Cypionate 200 Mg/ml Oil (Testosterone Cypionate) .... Inject 1ml Intramusculary Every Month As Directed. 10)  Allopurinol 100 Mg Tabs (Allopurinol) .... Take 1 Tablet By Mouth Once A Day 11)  Miralax   Powd (Polyethylene Glycol 3350) .Marland Kitchen.. 17g By Mouth Once Daily As Needed Constipation 12)  Amlodipine Besylate 10 Mg Tabs (Amlodipine Besylate) .... Take One Tablet By Mouth Daily 13)  Carvedilol 6.25 Mg Tabs (Carvedilol) .... Take One Tablet By Mouth Twice A Day 14)  Micro-K 10 Meq Cr-Caps (Potassium Chloride) .Marland Kitchen.. 1 Tab 3 X Weekly 15)  Hydrocodone-Acetaminophen 5-325 Mg Tabs (Hydrocodone-Acetaminophen) .Marland Kitchen.. 1  By Mouth Up To 4 Times Per Day As Needed For Pain  Allergies (verified): No Known Drug Allergies  Past History:  Past Medical History: Last updated: 09/29/2008 Coronary artery disease--no known MI  1990 Gout Hypertension Renal insufficiency FUO-duration 15 mo resolved AAA  2002  3.2 cm macular degeneration bladder, followed by Dr. Earlene Plater Peripheral vascular disease OSA   CPAP Hyperlipidemia 1. diastolic congestive heart failure 2. Status post aortocoronary bypass surgery in 1990 , S/P multiple PCIs with occlusion of all SVGs and only patent LIMA at cath 08/2008 3.  EF 55% by echo 08/2008 5. History of abdominal aortic aneurysm with repair. 6. Macular degeneration. 7. Hyperlipidemia. 8. Sleep apnea on continuous positive airway pressure. 9. Depression. 10.Chronic kidney disease, stage III with a glomerular filtration rate     of 37 at discharge. 11.Remote history of ethyl alcohol and tobacco use. 12.History of renal adenoma and nephrolithiasis. 13.History of basal cell skin cancer. 14.Status post back surgery. 15.Possible depression symptoms, follow up with primary care. 16.Hypertension.    Past Surgical History: Last updated: 09/29/2008 * ABDOMINAL ANEURYSM REPAIR. PROSTATECTOMY, TRANSURETHRAL, HX OF (ICD-V15.9) CORONARY ARTERY BYPASS GRAFT, HX OF (ICD-V45.81) CHOLECYSTECTOMY, HX OF (ICD-V45.79)   disc surgery X5 TUNA stent 12/02  Family History: Last updated: 05/18/2008 father-emphysema pat uncles x 2 -heart disease pat aunt-rheumatism  Social History: Last updated: 05/18/2008 Married with children. Former Smoker.  Quit smoking 1990.  Smoked x 52 years  upto 2ppd. 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  Risk Factors: Exercise: no (11/25/2006)  Risk Factors: Smoking Status: quit > 6 months (08/18/2009)  Physical Exam  General:  elderly, chronically ill male in no acute distress. HEENT exam he has multiple healing lacerations  on head. Neck without lymphadenopathy or thyromegaly. He has limited range of motion with rotation of head to left and right. Chest clear to auscultation cardiac exam S1-S2 are regular abdominal exam overweight, to bowel sounds, soft. Extremities no clubbing cyanosis or edema.   Impression & Recommendations:  Problem # 1:  NECK PAIN (ICD-723.1) improved---still some stiffness and soreness.  His updated medication list for this problem includes:    Hydrocodone-acetaminophen 5-325 Mg Tabs (Hydrocodone-acetaminophen) .Marland Kitchen... 1 by mouth up to 4 times per day as needed for pain  Problem # 2:  HYPERLIPIDEMIA (ICD-272.4) controlled continue current medications  His updated medication list for this problem includes:    Simvastatin 20 Mg Tabs (Simvastatin) ..... Once daily  Labs Reviewed: SGOT: 20 (03/08/2010)   SGPT: 11 (03/08/2010)  Prior 10 Yr Risk Heart Disease: N/A (11/25/2006)   HDL:31.60 (03/08/2010), 28.20 (03/23/2009)  LDL:102 (03/08/2010), 99 (16/01/9603)  Chol:161 (03/08/2010), 157 (03/23/2009)  Trig:137.0 (03/08/2010), 150.0 (03/23/2009)  Problem # 3:  RENAL INSUFFICIENCY (ICD-588.9)  Problem # 4:  DIASTOLIC HEART FAILURE, CHRONIC (ICD-428.32) coinically doing well.  His updated medication list for this problem includes:    Cozaar 50 Mg Tabs (Losartan potassium) .Marland Kitchen... Take one tablet by mouth daily    Furosemide 80 Mg Tabs (Furosemide) .Marland Kitchen... 1 tab 3 x weekly    Carvedilol 6.25 Mg Tabs (Carvedilol) .Marland Kitchen... Take one tablet by mouth twice a day  Problem # 5:  CELLULITIS, LEFT LEG (ICD-682.6)  he does have some erythema of the left leg. It's unclear as to the etiology. He has some warmth to it. We'll treat empirically with antibiotics. He'll call me if symptoms worsen or don't resolve.  Complete Medication List: 1)  Alprazolam 0.5 Mg Tabs (Alprazolam) .... Take 1 tablet by mouth two times a day as needed 2)  Nitroglycerin 0.4 Mg Subl (Nitroglycerin) .... Place 1 tablet under tongue  as directed 3)  Wellbutrin Sr 150 Mg Tb12 (Bupropion hcl) .... One by mouth twice daily 4)  Simvastatin 20 Mg Tabs (Simvastatin) .... Once daily 5)  Cozaar 50 Mg Tabs (Losartan potassium) .... Take one tablet by mouth daily 6)  Cpap  .... At bedtime 7)  Imdur 30 Mg Xr24h-tab (Isosorbide mononitrate) .Marland Kitchen.. 1 tab once daily 8)  Furosemide 80 Mg Tabs (Furosemide) .Marland Kitchen.. 1 tab 3 x weekly 9)  Testosterone Cypionate 200 Mg/ml Oil (Testosterone cypionate) .... Inject 1ml intramusculary every month as directed. 10)  Allopurinol 100 Mg Tabs (Allopurinol) .... Take 1 tablet by mouth once a day 11)  Miralax Powd (Polyethylene glycol 3350) .Marland Kitchen.. 17g by mouth once daily as needed constipation 12)  Amlodipine Besylate 10 Mg Tabs (Amlodipine besylate) .... Take one tablet by mouth daily 13)  Carvedilol 6.25 Mg Tabs (Carvedilol) .... Take one tablet by mouth twice a day 14)  Micro-k 10 Meq Cr-caps (Potassium chloride) .Marland Kitchen.. 1 tab 3 x weekly 15)  Hydrocodone-acetaminophen 5-325 Mg Tabs (Hydrocodone-acetaminophen) .Marland Kitchen.. 1 by mouth up to 4 times per day as needed for pain  Patient Instructions: 1)  1 month Prescriptions: DOXYCYCLINE HYCLATE 100 MG CAPS (DOXYCYCLINE HYCLATE) Take 1 tab twice a day  #20 x 0   Entered and Authorized by:   Birdie Sons MD  Signed by:   Birdie Sons MD on 03/27/2010   Method used:   Electronically to        [id number]* (retail)       [address] [place], [place]  [address]       Ph: [phone]       Fax: [phone]   RxID:   [id number]    Orders Added: 1)  Est. Patient Level IV [27253]

## 2010-05-30 NOTE — Progress Notes (Signed)
Summary: med update  Phone Note Call from Patient   Caller: Spouse Summary of Call: The pt's wife called to verfiy he does not have coreg at home. I will call in coreg to [facility] for him Initial call taken by: Sherri Rad, RN, BSN,  November 18, 2009 3:58 PM    Prescriptions: CARVEDILOL 6.25 MG TABS (CARVEDILOL) Take one tablet by mouth twice a day  #180 x 3   Entered by:   Sherri Rad, RN, BSN   Authorized by:   Lenoria Farrier, MD, [facility]   Signed by:   Sherri Rad, RN, BSN on 11/18/2009   Method used:   Electronically to        [facility] [phone]* (retail)       [address] [place], [place]  [address]       Ph: [phone]       Fax: [phone]   RxID:   [id number]

## 2010-05-30 NOTE — Assessment & Plan Note (Signed)
Summary: f2m   Visit Type:  Follow-up Referring Provider:  Dr Birdie Sons Primary Provider:  Dr Birdie Sons  CC:  retaining fluid and coughing.  History of Present Illness: The patient is 77 years old and returns for management of CAD and CHF. He had remote bypass surgery and has had multiple PCI procedures. His last catheterization was in May of 2010 at which time only his LIMA graft was patent. He did have fairly good collaterals to his other vessels and he had normal LV function. He did have elevation of the pulley wedge pressure related to diastolic heart failure and was diuresed for this.  He has been doing fairly well with his heart over the past 6 months although he says he has had some increased edema of the lower extremities. He's had no chest pain and no palpitations.  His other major problem is macular degeneration. He's been treated for this and has lost vision entirely in his left eye he can no longer read or drive. He is quite depressed about this and cried in the office today. His wife is with him today.  His other medical problems include hypertension, hyperlipidemia, previous abdominal aneurysm repair, chronic renal CC with creatinine of 1.6, and obstructive sleep apnea.  Current Medications (verified): 1)  Alprazolam 0.5 Mg Tabs (Alprazolam) .... Take 1 Tablet By Mouth Two Times A Day As Needed 2)  Nitroglycerin 0.4 Mg Subl (Nitroglycerin) .... Place 1 Tablet Under Tongue As Directed 3)  Wellbutrin Sr 150 Mg  Tb12 (Bupropion Hcl) .... One By Mouth Twice Daily 4)  Colchicine 0.6 Mg  Tabs (Colchicine) .... As Directed 5)  Simvastatin 20 Mg  Tabs (Simvastatin) .... Once Daily 6)  Androgel Pump 1 %  Gel (Testosterone) .... Apply 4 Pumps To Shoulders and Abdomen Daily 7)  Amlodipine Besylate 5 Mg  Tabs (Amlodipine Besylate) .Marland Kitchen.. 1 By Mouth Every Day 8)  Benicar 20 Mg  Tabs (Olmesartan Medoxomil) .... Take 1 Tablet By Mouth Once A Day 9)  Cpap .... At Bedtime 10)  Imdur 30  Mg Xr24h-Tab (Isosorbide Mononitrate) .Marland Kitchen.. 1 Tab Once Daily 11)  Furosemide 40 Mg Tabs (Furosemide) .... Take One Tablet By Mouth Daily. 12)  Potassium Chloride Cr 10 Meq Cr-Caps (Potassium Chloride) .... Take One Tablet By Mouth Daily 13)  Carvedilol 6.25 Mg Tabs (Carvedilol) .... Take One Tablet By Mouth Twice A Day 14)  Testosterone Cypionate 200 Mg/ml Oil (Testosterone Cypionate) .... Inject 1ml Intramusculary Every Month As Directed. 15)  Allopurinol 100 Mg Tabs (Allopurinol) .... Take 1 Tablet By Mouth Once A Day  Allergies (verified): No Known Drug Allergies  Past History:  Past Medical History: Reviewed history from 09/29/2008 and no changes required. Coronary artery disease--no known MI  1990 Gout Hypertension Renal insufficiency FUO-duration 15 mo resolved AAA  2002  3.2 cm macular degeneration bladder, followed by Dr. Earlene Plater Peripheral vascular disease OSA   CPAP Hyperlipidemia 1. diastolic congestive heart failure 2. Status post aortocoronary bypass surgery in 1990 , S/P multiple PCIs with occlusion of all SVGs and only patent LIMA at cath 08/2008 3.  EF 55% by echo 08/2008 5. History of abdominal aortic aneurysm with repair. 6. Macular degeneration. 7. Hyperlipidemia. 8. Sleep apnea on continuous positive airway pressure. 9. Depression. 10.Chronic kidney disease, stage III with a glomerular filtration rate     of 37 at discharge. 11.Remote history of ethyl alcohol and tobacco use. 12.History of renal adenoma and nephrolithiasis. 13.History of basal cell skin cancer. 14.Status post  back surgery. 15.Possible depression symptoms, follow up with primary care. 16.Hypertension.    Review of Systems       ROS is negative except as outlined in HPI.   Vital Signs:  Patient profile:   77 year old male Height:      70 inches Weight:      214 pounds BMI:     30.82 Pulse rate:   74 / minute BP sitting:   138 / 72  (left arm) Cuff size:   regular  Vitals Entered  By: Hardin Negus, RMA (July 01, 2009 11:21 AM)  Physical Exam  Additional Exam:  Gen. Well-nourished, in no distress   Neck: No JVD, thyroid not enlarged, no carotid bruits Lungs: No tachypnea, clear without rales, rhonchi or wheezes Cardiovascular: Rhythm regular, PMI not displaced,  heart sounds  normal, no murmurs or gallops, no peripheral edema, pulses normal in all 4 extremities. Abdomen: BS normal, abdomen soft and non-tender without masses or organomegaly, no hepatosplenomegaly. MS: No deformities, no cyanosis or clubbing   Neuro:  No focal sns   Skin:  no lesions    Impression & Recommendations:  Problem # 1:  CORONARY ARTERY BYPASS GRAFT, HX OF (ICD-V45.81) He has had previous bypass surgery and multiple PCI procedures with coronary anatomy as described above. He's had no chest pain this problem appears stable. Orders: EKG w/ Interpretation (93000) TLB-BMP (Basic Metabolic Panel-BMET) (80048-METABOL) TLB-CBC Platelet - w/Differential (85025-CBCD)  Problem # 2:  DIASTOLIC HEART FAILURE, CHRONIC (ICD-428.32) He has a history of diastolic heart failure. He has slight JVD today and 2+ peripheral edema. I think most of the edema is related to venous insufficiency of the lower extremities but he probably has some element of diastolic heart failure as well. We will increase his Lasix from 40-80 mg daily. We'll get a BMP today and in a week. We'll also give him support hose for his lower extremities and encourage him to keep his feet elevated.   His updated medication list for this problem includes:    Nitroglycerin 0.4 Mg Subl (Nitroglycerin) .Marland Kitchen... Place 1 tablet under tongue as directed    Amlodipine Besylate 5 Mg Tabs (Amlodipine besylate) .Marland Kitchen... 1 by mouth every day    Benicar 20 Mg Tabs (Olmesartan medoxomil) .Marland Kitchen... Take 1 tablet by mouth once a day    Imdur 30 Mg Xr24h-tab (Isosorbide mononitrate) .Marland Kitchen... 1 tab once daily    Furosemide 80 Mg Tabs (Furosemide) .Marland Kitchen... Take one  tablet by mouth daily.    Carvedilol 6.25 Mg Tabs (Carvedilol) .Marland Kitchen... Take one tablet by mouth twice a day  Problem # 3:  HYPERTENSION (ICD-401.9) This is controlled on current medications. His updated medication list for this problem includes:    Amlodipine Besylate 5 Mg Tabs (Amlodipine besylate) .Marland Kitchen... 1 by mouth every day    Benicar 20 Mg Tabs (Olmesartan medoxomil) .Marland Kitchen... Take 1 tablet by mouth once a day    Furosemide 80 Mg Tabs (Furosemide) .Marland Kitchen... Take one tablet by mouth daily.    Carvedilol 6.25 Mg Tabs (Carvedilol) .Marland Kitchen... Take one tablet by mouth twice a day  Patient Instructions: 1)  Your physician recommends that you schedule a follow-up appointment in: 4 months. 2)  Your physician recommends that you have lab work today: bmet/cbc (428.33;414.01) 3)  You will need labwork repeated in 1 week: bmet (428.33;414.01) 4)  Your physician has recommended you make the following change in your medication: 1) Increase lasix to 80mg  once daily  5)  You  have been given an order for support hose. You may obtain these from a medical supply store such as [employer] 6)  If you feel that Wellbutrin is not enough for you, then please contact Dr. Cato Mulligan regarding this.

## 2010-05-30 NOTE — Assessment & Plan Note (Signed)
Summary: BILATERAL LEG EDEMA // RS/pt rescd//ccm   Vital Signs:  Patient profile:   77 year old male Weight:      212 pounds Temp:     97.7 degrees F oral Pulse rate:   70 / minute Pulse rhythm:   regular Resp:     12 per minute BP sitting:   146 / 74  (left arm) Cuff size:   regular  Vitals Entered By: Gladis Riffle, RN (August 18, 2009 11:57 AM) CC: c/o bilateral leg swelling, states are better today Is Patient Diabetic? No   Primary Care Provider:  Dr Birdie Sons  CC:  c/o bilateral leg swelling and states are better today.  History of Present Illness: bilateral lower extremity edema for months---no SOB he admits to sitting without any activity  complains of longterm constipation---says he frequently will go 4 days without a BM---ongoing for months.   progressive visual loss--has f/u with ophthalmolgy  All other systems reviewed and were negative   Preventive Screening-Counseling & Management  Alcohol-Tobacco     Smoking Status: quit > 6 months     Year Quit: 1950     Pack years: 1990  Current Medications (verified): 1)  Alprazolam 0.5 Mg Tabs (Alprazolam) .... Take 1 Tablet By Mouth Two Times A Day As Needed 2)  Nitroglycerin 0.4 Mg Subl (Nitroglycerin) .... Place 1 Tablet Under Tongue As Directed 3)  Wellbutrin Sr 150 Mg  Tb12 (Bupropion Hcl) .... One By Mouth Twice Daily 4)  Colchicine 0.6 Mg  Tabs (Colchicine) .... As Directed 5)  Simvastatin 20 Mg  Tabs (Simvastatin) .... Once Daily 6)  Amlodipine Besylate 5 Mg  Tabs (Amlodipine Besylate) .Marland Kitchen.. 1 By Mouth Every Day 7)  Benicar 20 Mg  Tabs (Olmesartan Medoxomil) .... Take 1 Tablet By Mouth Once A Day 8)  Cpap .... At Bedtime 9)  Imdur 30 Mg Xr24h-Tab (Isosorbide Mononitrate) .Marland Kitchen.. 1 Tab Once Daily 10)  Furosemide 80 Mg Tabs (Furosemide) .... Take One Tablet By Mouth Daily. 11)  Potassium Chloride Cr 10 Meq Cr-Caps (Potassium Chloride) .... Take One Tablet By Mouth Daily 12)  Carvedilol 6.25 Mg Tabs (Carvedilol)  .... Take One Tablet By Mouth Twice A Day 13)  Testosterone Cypionate 200 Mg/ml Oil (Testosterone Cypionate) .... Inject 1ml Intramusculary Every Month As Directed. 14)  Allopurinol 100 Mg Tabs (Allopurinol) .... Take 1 Tablet By Mouth Once A Day  Allergies (verified): No Known Drug Allergies  Past History:  Past Medical History: Last updated: 09/29/2008 Coronary artery disease--no known MI  1990 Gout Hypertension Renal insufficiency FUO-duration 15 mo resolved AAA  2002  3.2 cm macular degeneration bladder, followed by Dr. Earlene Plater Peripheral vascular disease OSA   CPAP Hyperlipidemia 1. diastolic congestive heart failure 2. Status post aortocoronary bypass surgery in 1990 , S/P multiple PCIs with occlusion of all SVGs and only patent LIMA at cath 08/2008 3.  EF 55% by echo 08/2008 5. History of abdominal aortic aneurysm with repair. 6. Macular degeneration. 7. Hyperlipidemia. 8. Sleep apnea on continuous positive airway pressure. 9. Depression. 10.Chronic kidney disease, stage III with a glomerular filtration rate     of 37 at discharge. 11.Remote history of ethyl alcohol and tobacco use. 12.History of renal adenoma and nephrolithiasis. 13.History of basal cell skin cancer. 14.Status post back surgery. 15.Possible depression symptoms, follow up with primary care. 16.Hypertension.    Past Surgical History: Last updated: 09/29/2008 * ABDOMINAL ANEURYSM REPAIR. PROSTATECTOMY, TRANSURETHRAL, HX OF (ICD-V15.9) CORONARY ARTERY BYPASS GRAFT, HX OF (ICD-V45.81) CHOLECYSTECTOMY,  HX OF (ICD-V45.79)   disc surgery X5 TUNA stent 12/02  Family History: Last updated: 05/18/2008 father-emphysema pat uncles x 2 -heart disease pat aunt-rheumatism  Social History: Last updated: 05/18/2008 Married with children. Former Smoker.  Quit smoking 1990.  Smoked x 52 years upto 2ppd. 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  Risk Factors: Exercise: no  (11/25/2006)  Risk Factors: Smoking Status: quit > 6 months (08/18/2009)  Physical Exam  General:  alert and well-developed.   Head:  normocephalic and atraumatic.   Ears:  R ear normal and L ear normal.   Neck:  no jvd, tmg, ln Lungs:  normal respiratory effort and no intercostal retractions.   Heart:  normal rate and regular rhythm.   Abdomen:  soft and nontender, bs + Msk:  No deformity or scoliosis noted of thoracic or lumbar spine.   Extremities:  trace left pedal edema and trace right pedal edema.     Impression & Recommendations:  Problem # 1:  DIASTOLIC HEART FAILURE, CHRONIC (ICD-428.32) has lower extremity edemat---could be related to diagnosis the edema may have multiple causes (dependent  edema, amlodipine, vein harvesting he has good clinical-results with furosemide (causes increased urination)  His updated medication list for this problem includes:    Benicar 40 Mg Tabs (Olmesartan medoxomil) .Marland Kitchen... 1 by mouth once daily    Furosemide 80 Mg Tabs (Furosemide) .Marland Kitchen... Take one tablet by mouth daily.    Carvedilol 6.25 Mg Tabs (Carvedilol) .Marland Kitchen... Take one tablet by mouth twice a day  Echocardiogram:  Left ventricle: The cavity size was normal. The estimated ejection   fraction was 55%. Regional wall motion abnormalities: Cannot exclude   hypokinesis of the inferior myocardium. Doppler parameters are   consistent with restrictive physiology, indicative of decreased left   ventricular diastolic compliance and/or increased left atrial   pressure. Doppler parameters are consistent with high ventricular   filling pressure.   Aortic valve: Structurally normal valve. Cusp separation was normal.   Doppler: Transvalvular velocity was within the normal range. There   was no stenosis. No regurgitation.   Aorta: Aortic root: The aortic root was normal in size.   Ascending aorta: The ascending aorta was normal in size.   Mitral valve: Structurally normal valve. Leaflet separation  was   normal. Doppler: Transvalvular velocity was within the normal range.   There was no evidence for stenosis. No regurgitation.  Peak   gradient: 5mm Hg (D).    Tricuspid valve: Structurally normal valve. Leaflet separation was   normal. Doppler: Transvalvular velocity was within the normal range.   No regurgitation.   Right atrium: The atrium was normal in size.   Pericardium: The pericardium was normal in appearance.   Nicholes Mango, MD     (09/07/2008)  Problem # 2:  HYPERLIPIDEMIA (ICD-272.4) controlled continue current medications  His updated medication list for this problem includes:    Simvastatin 20 Mg Tabs (Simvastatin) ..... Once daily  Labs Reviewed: SGOT: 20 (03/23/2009)   SGPT: 12 (03/23/2009)  Prior 10 Yr Risk Heart Disease: N/A (11/25/2006)   HDL:28.20 (03/23/2009), 30.9 (10/03/2007)  LDL:99 (03/23/2009), DEL (91/47/8295)  Chol:157 (03/23/2009), 152 (10/03/2007)  Trig:150.0 (03/23/2009), 207 (10/03/2007)  Problem # 3:  CONSTIPATION (ICD-564.00)  likely related to sedentary lifestyle and meds add miralax  His updated medication list for this problem includes:    Miralax Powd (Polyethylene glycol 3350) .Marland KitchenMarland KitchenMarland KitchenMarland Kitchen 17g by mouth once daily as needed constipation  Orders: Prescription Created Electronically [id number])  Complete Medication List: 1)  Alprazolam 0.5 Mg Tabs (Alprazolam) .... Take 1 tablet by mouth two times a day as needed 2)  Nitroglycerin 0.4 Mg Subl (Nitroglycerin) .... Place 1 tablet under tongue as directed 3)  Wellbutrin Sr 150 Mg Tb12 (Bupropion hcl) .... One by mouth twice daily 4)  Colchicine 0.6 Mg Tabs (Colchicine) .... As directed 5)  Simvastatin 20 Mg Tabs (Simvastatin) .... Once daily 6)  Benicar 40 Mg Tabs (Olmesartan medoxomil) .Marland Kitchen.. 1 by mouth once daily 7)  Cpap  .... At bedtime 8)  Imdur 30 Mg Xr24h-tab (Isosorbide mononitrate) .Marland Kitchen.. 1 tab once daily 9)  Furosemide 80 Mg Tabs (Furosemide) .... Take one tablet by mouth daily. 10)   Potassium Chloride Cr 10 Meq Cr-caps (Potassium chloride) .... Take one tablet by mouth daily 11)  Carvedilol 6.25 Mg Tabs (Carvedilol) .... Take one tablet by mouth twice a day 12)  Testosterone Cypionate 200 Mg/ml Oil (Testosterone cypionate) .... Inject 1ml intramusculary every month as directed. 13)  Allopurinol 100 Mg Tabs (Allopurinol) .... Take 1 tablet by mouth once a day 14)  Miralax Powd (Polyethylene glycol 3350) .Marland Kitchen.. 17g by mouth once daily as needed constipation  Patient Instructions: 1)  .Please schedule a follow-up appointment in 1 month. Prescriptions: MIRALAX   POWD (POLYETHYLENE GLYCOL 3350) 17g by mouth once daily as needed constipation  #1 container x prn   Entered and Authorized by:   Birdie Sons MD   Signed by:   Birdie Sons MD on 08/26/2009   Method used:   Electronically to        [facility] [id number] (retail)       [address] [place], [place]  [address]       Ph: [phone]       Fax: [phone]   RxID:   [id number] BENICAR 40 MG  TABS (OLMESARTAN MEDOXOMIL) 1 by mouth once daily  #90 x 3   Entered and Authorized by:   Birdie Sons MD   Signed by:   Birdie Sons MD on 08/26/2009   Method used:   Samples Given   RxID:   [id number]

## 2010-05-30 NOTE — Assessment & Plan Note (Signed)
Summary: testosterone inj/et  Nurse Visit   Allergies: No Known Drug Allergies  Medication Administration  Injection # 1:    Medication: Testosterone Cypionat 200mg  ing    Diagnosis: TESTOSTERONE DEFICIENCY (ICD-257.2)    Route: IM    Site: RUOQ gluteus    Exp Date: 06/29/2011    Lot #: [id number]    Mfr: watson    Patient tolerated injection without complications    Given by: Gladis Riffle, RN (December 14, 2009 10:52 AM)  Orders Added: 1)  Admin of patients own med IM/SQ [96372M]   Medication Administration  Injection # 1:    Medication: Testosterone Cypionat 200mg  ing    Diagnosis: TESTOSTERONE DEFICIENCY (ICD-257.2)    Route: IM    Site: RUOQ gluteus    Exp Date: 06/29/2011    Lot #: [id number]    Mfr: watson    Patient tolerated injection without complications    Given by: Gladis Riffle, RN (December 14, 2009 10:52 AM)  Orders Added: 1)  Admin of patients own med IM/SQ [phone]

## 2010-05-30 NOTE — Assessment & Plan Note (Signed)
Summary: change bandages only/cdw  Nurse Visit   Allergies: No Known Drug Allergies  Orders Added: 1)  Est. Patient Level I [84132] 2)  Coban Wrap, 3-5 in/yd. [G4010] 3)  Coban Wrap,  <3 in/yd [U7253]  Appended Document: change bandages only/cdw    Nurse Visit   Allergies: No Known Drug Allergies  pt came in with bandages on both forearms and head that needed to be changed.  Wounds were wrapped in gauze and stuck to the pt's lacerations.  I wet the gauze and wounds with 0.99% Sodium Chloride to help remove the gauze and then I cleaned the wounds.  There were multiple wounds with jagged edges and there did not appear to be any infection.  I put ointment and telfa on the arms and then wrapped them in Coban.  I took the gauze off of the head and redressed it with gauze.  Pt had 7 staples on anterior wound and 8 staples on posterior wound.  Told pt take sponge baths until he f/u with Dr Cato Mulligan on 03/20/10.

## 2010-05-30 NOTE — Progress Notes (Signed)
Summary: swelling in feet  Phone Note Call from Patient Call back at Home Phone [phone] Call back at [phone]   Caller: Spouse Reason for Call: Talk to Nurse Summary of Call: per pt wife calling h/o chf . no chestpain, c/o swelling in  feet.  Initial call taken by: Lorne Skeens,  Sep 15, 2009 3:53 PM  Follow-up for Phone Call        [facility] Katina Dung, RN, BSN  Sep 15, 2009 4:25 PM  I left a message for the pt's wife to call at the cell # left.  I called the pt and spoke with him at his home #. He denies SOB and states that his weight is stable. He is taking his medications as prescribed. He has not been wearing his support stockings as prescribed. I have advised the pt to try to wear these to help with his edema and to call next week should he not notice any change. The pt verbalizes understanding. Follow-up by: Sherri Rad, RN, BSN,  Sep 16, 2009 9:30 AM

## 2010-05-30 NOTE — Letter (Signed)
Summary: Medical Necessity for Seat Lift Mechanism  Medical Necessity for Seat Lift Mechanism   Imported By: Maryln Gottron 11/21/2009 13:59:29  _____________________________________________________________________  External Attachment:    Type:   Image     Comment:   External Document

## 2010-06-01 NOTE — Progress Notes (Signed)
Summary: Pt still has open wound from fall pt had  Phone Note Call from Patient Call back at Home Phone [phone]   Caller: spouse - Dorene Sorrow Summary of Call: Pt still has open wounds on head from the fall he had. Wounds are leaking and bleeding, also crusting. Pt needs to know what he should do. Pls call asap.  Initial call taken by: Lucy Antigua,  April 27, 2010 1:36 PM  Follow-up for Phone Call        Appt scheduled tomorrow with Dr. Caryl Never Follow-up by: Lynann Beaver CMA AAMA,  April 27, 2010 1:46 PM

## 2010-06-01 NOTE — Assessment & Plan Note (Signed)
Summary: 1 week follow up/cb   Vital Signs:  Patient profile:   77 year old male Weight:      211 pounds Temp:     98.6 degrees F oral BP sitting:   152 / 70  (left arm) Cuff size:   large  Vitals Entered By: Alfred Levins, CMA (May 08, 2010 11:09 AM) CC: look at head   Primary Care Provider:  Dr Birdie Sons  CC:  look at head.  History of Present Illness:  see previous notes. Patient with a history of trauma. Had nonhealing eschars on his scalp. he is here for followup. He is otherwise feeling well. No fevers or chills.  Review of systems: he has chronic visual loss. no change. he has chronic trouble with ambulation but has had no recent falls. no chest pain, shortness of breath, lower extremity edema or any other significant complaints.  Current Medications (verified): 1)  Alprazolam 0.5 Mg Tabs (Alprazolam) .... Take 1 Tablet By Mouth Two Times A Day As Needed 2)  Nitroglycerin 0.4 Mg Subl (Nitroglycerin) .... Place 1 Tablet Under Tongue As Directed 3)  Wellbutrin Sr 150 Mg  Tb12 (Bupropion Hcl) .... One By Mouth Twice Daily 4)  Simvastatin 20 Mg  Tabs (Simvastatin) .... Once Daily 5)  Cozaar 50 Mg Tabs (Losartan Potassium) .... Take One Tablet By Mouth Daily 6)  Cpap .... At Bedtime 7)  Imdur 30 Mg Xr24h-Tab (Isosorbide Mononitrate) .Marland Kitchen.. 1 Tab Once Daily 8)  Furosemide 80 Mg Tabs (Furosemide) .Marland Kitchen.. 1 Tab 3 X Weekly 9)  Testosterone Cypionate 200 Mg/ml Oil (Testosterone Cypionate) .... Inject 1ml Intramusculary Every Month As Directed. 10)  Allopurinol 100 Mg Tabs (Allopurinol) .... Take 1 Tablet By Mouth Once A Day 11)  Miralax   Powd (Polyethylene Glycol 3350) .Marland Kitchen.. 17g By Mouth Once Daily As Needed Constipation 12)  Amlodipine Besylate 10 Mg Tabs (Amlodipine Besylate) .... Take One Tablet By Mouth Daily 13)  Carvedilol 6.25 Mg Tabs (Carvedilol) .... Take One Tablet By Mouth Twice A Day 14)  Micro-K 10 Meq Cr-Caps (Potassium Chloride) .Marland Kitchen.. 1 Tab 3 X Weekly  Allergies  (verified): No Known Drug Allergies  Past History:  Past Medical History: Last updated: 09/29/2008 Coronary artery disease--no known MI  1990 Gout Hypertension Renal insufficiency FUO-duration 15 mo resolved AAA  2002  3.2 cm macular degeneration bladder, followed by Dr. Earlene Plater Peripheral vascular disease OSA   CPAP Hyperlipidemia 1. diastolic congestive heart failure 2. Status post aortocoronary bypass surgery in 1990 , S/P multiple PCIs with occlusion of all SVGs and only patent LIMA at cath 08/2008 3.  EF 55% by echo 08/2008 5. History of abdominal aortic aneurysm with repair. 6. Macular degeneration. 7. Hyperlipidemia. 8. Sleep apnea on continuous positive airway pressure. 9. Depression. 10.Chronic kidney disease, stage III with a glomerular filtration rate     of 37 at discharge. 11.Remote history of ethyl alcohol and tobacco use. 12.History of renal adenoma and nephrolithiasis. 13.History of basal cell skin cancer. 14.Status post back surgery. 15.Possible depression symptoms, follow up with primary care. 16.Hypertension.    Past Surgical History: Last updated: 09/29/2008 * ABDOMINAL ANEURYSM REPAIR. PROSTATECTOMY, TRANSURETHRAL, HX OF (ICD-V15.9) CORONARY ARTERY BYPASS GRAFT, HX OF (ICD-V45.81) CHOLECYSTECTOMY, HX OF (ICD-V45.79)   disc surgery X5 TUNA stent 12/02  Family History: Last updated: 05/18/2008 father-emphysema pat uncles x 2 -heart disease pat aunt-rheumatism  Social History: Last updated: 05/18/2008 Married with children. Former Smoker.  Quit smoking 1990.  Smoked x 52 years upto  2ppd. 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  Risk Factors: Exercise: no (11/25/2006)  Risk Factors: Smoking Status: quit > 6 months (08/18/2009)  Physical Exam  General:   elderly male in no acute distress. HEENT exam 2 large eschars on the scalp. the one on the occipital area seems to be fluctuant. Purulent material expressed from  the area. no surrounding erythema. No tenderness to palpation.   Impression & Recommendations:  Problem # 1:  LACERATION (ICD-879.8)   scalp laceration and abrasions. Remote history of trauma. Has had difficulty healing the area. hip significant fluctuance on one of the significant eschars. purulent material was produced. I thought it was best to applied to area. Complicated department after risks and benefits discussed with patient and wife. large eschar was removed.  there was minimal purulent material under the eschar. The culture was sent for identification.  Orders: I&D Abscess, Complex (10061)  Complete Medication List: 1)  Alprazolam 0.5 Mg Tabs (Alprazolam) .... Take 1 tablet by mouth two times a day as needed 2)  Nitroglycerin 0.4 Mg Subl (Nitroglycerin) .... Place 1 tablet under tongue as directed 3)  Wellbutrin Sr 150 Mg Tb12 (Bupropion hcl) .... One by mouth twice daily 4)  Simvastatin 20 Mg Tabs (Simvastatin) .... Once daily 5)  Cozaar 50 Mg Tabs (Losartan potassium) .... Take one tablet by mouth daily 6)  Cpap  .... At bedtime 7)  Imdur 30 Mg Xr24h-tab (Isosorbide mononitrate) .Marland Kitchen.. 1 tab once daily 8)  Furosemide 80 Mg Tabs (Furosemide) .Marland Kitchen.. 1 tab 3 x weekly 9)  Testosterone Cypionate 200 Mg/ml Oil (Testosterone cypionate) .... Inject 1ml intramusculary every month as directed. 10)  Allopurinol 100 Mg Tabs (Allopurinol) .... Take 1 tablet by mouth once a day 11)  Miralax Powd (Polyethylene glycol 3350) .Marland Kitchen.. 17g by mouth once daily as needed constipation 12)  Amlodipine Besylate 10 Mg Tabs (Amlodipine besylate) .... Take one tablet by mouth daily 13)  Carvedilol 6.25 Mg Tabs (Carvedilol) .... Take one tablet by mouth twice a day 14)  Micro-k 10 Meq Cr-caps (Potassium chloride) .Marland Kitchen.. 1 tab 3 x weekly   Orders Added: 1)  Est. Patient Level III [16109] 2)  I&D Abscess, Complex [10061]  Appended Document: Orders Update    Clinical Lists Changes  Orders: Added new Test  order of T-Culture, Wound ([id number]) - Signed

## 2010-06-01 NOTE — Assessment & Plan Note (Signed)
Summary: open wounds/dm   Vital Signs:  Patient profile:   77 year old male Temp:     97.6 degrees F BP sitting:   140 / 80  (left arm) Cuff size:   large  Vitals Entered By: Sid Falcon LPN (April 28, 2010 11:33 AM)  History of Present Illness: Larey Seat at [facility] Nov 14. Larey Seat getting on escalator. Injuries to L arm, L scalp.  Brief LOC.  To [facility]. Staples to scalp.  CT head no bleed.  Only mild intermittent headaches. No nausea or vomiting.  CT C spine arthritic changes with no fx.  Seen now with slow to heal open wounds L scalp.  Pt has no hx of diabetes. Has been using peroxide and wife states that he freq "picks" at his wounds. No fever or chills.  No congitive changes.  Pt very unsteady on feet at  baseline and they have considered some PT. Hx CAD with no recent chest pain.  hypertension hx.  pt denies recent orthostatic symptoms.  Compliant with all meds with no reported side effects.  Allergies (verified): No Known Drug Allergies  Past History:  Past Medical History: Last updated: 09/29/2008 Coronary artery disease--no known MI  1990 Gout Hypertension Renal insufficiency FUO-duration 15 mo resolved AAA  2002  3.2 cm macular degeneration bladder, followed by Dr. Earlene Plater Peripheral vascular disease OSA   CPAP Hyperlipidemia 1. diastolic congestive heart failure 2. Status post aortocoronary bypass surgery in 1990 , S/P multiple PCIs with occlusion of all SVGs and only patent LIMA at cath 08/2008 3.  EF 55% by echo 08/2008 5. History of abdominal aortic aneurysm with repair. 6. Macular degeneration. 7. Hyperlipidemia. 8. Sleep apnea on continuous positive airway pressure. 9. Depression. 10.Chronic kidney disease, stage III with a glomerular filtration rate     of 37 at discharge. 11.Remote history of ethyl alcohol and tobacco use. 12.History of renal adenoma and nephrolithiasis. 13.History of basal cell skin cancer. 14.Status post back surgery. 15.Possible  depression symptoms, follow up with primary care. 16.Hypertension.    Past Surgical History: Last updated: 09/29/2008 * ABDOMINAL ANEURYSM REPAIR. PROSTATECTOMY, TRANSURETHRAL, HX OF (ICD-V15.9) CORONARY ARTERY BYPASS GRAFT, HX OF (ICD-V45.81) CHOLECYSTECTOMY, HX OF (ICD-V45.79)   disc surgery X5 TUNA stent 12/02  Family History: Last updated: 05/18/2008 father-emphysema pat uncles x 2 -heart disease pat aunt-rheumatism  Social History: Last updated: 05/18/2008 Married with children. Former Smoker.  Quit smoking 1990.  Smoked x 52 years upto 2ppd. 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  Risk Factors: Exercise: no (11/25/2006)  Risk Factors: Smoking Status: quit > 6 months (08/18/2009) PMH-FH-SH reviewed for relevance  Review of Systems  The patient denies fever, vision loss, chest pain, syncope, dyspnea on exertion, and headaches.    Physical Exam  General:  Well-developed,well-nourished,in no acute distress; alert,appropriate and cooperative throughout examination Head:  L parietoocc 3 by 4 cm open wound with good granulation tissue L frontotemporal 2 by 2 cm crusted wound No cellulitis changes. Eyes:  pupils equal, pupils round, and pupils reactive to light.   Ears:  External ear exam shows no significant lesions or deformities.  Otoscopic examination reveals clear canals, tympanic membranes are intact bilaterally without bulging, retraction, inflammation or discharge. Hearing is grossly normal bilaterally. Mouth:  Oral mucosa and oropharynx without lesions or exudates.  Teeth in good repair. Neck:  No deformities, masses, or tenderness noted. Lungs:  Normal respiratory effort, chest expands symmetrically. Lungs are clear to auscultation, no crackles or wheezes.  Heart:  normal rate and regular rhythm.   Extremities:  No clubbing, cyanosis, edema, or deformity noted with normal full range of motion of all joints.   Neurologic:  alert &  oriented X3 and cranial nerves II-XII intact.  no focal strength defecits.  Cervical Nodes:  No lymphadenopathy noted Psych:  normally interactive, good eye contact, not anxious appearing, and not depressed appearing.     Impression & Recommendations:  Problem # 1:  LACERATION (ICD-879.8) open wounds L scalp with no signs of secondary infection. Stop H2O2 use and keep clean with soap and water.  Cont topical anitbiotic. Follow up with primary in one week.  Problem # 2:  HYPERTENSION (ICD-401.9)  His updated medication list for this problem includes:    Cozaar 50 Mg Tabs (Losartan potassium) .Marland Kitchen... Take one tablet by mouth daily    Furosemide 80 Mg Tabs (Furosemide) .Marland Kitchen... 1 tab 3 x weekly    Amlodipine Besylate 10 Mg Tabs (Amlodipine besylate) .Marland Kitchen... Take one tablet by mouth daily    Carvedilol 6.25 Mg Tabs (Carvedilol) .Marland Kitchen... Take one tablet by mouth twice a day  Problem # 3:  CORONARY ARTERY DISEASE (ICD-414.00)  His updated medication list for this problem includes:    Nitroglycerin 0.4 Mg Subl (Nitroglycerin) .Marland Kitchen... Place 1 tablet under tongue as directed    Cozaar 50 Mg Tabs (Losartan potassium) .Marland Kitchen... Take one tablet by mouth daily    Imdur 30 Mg Xr24h-tab (Isosorbide mononitrate) .Marland Kitchen... 1 tab once daily    Furosemide 80 Mg Tabs (Furosemide) .Marland Kitchen... 1 tab 3 x weekly    Amlodipine Besylate 10 Mg Tabs (Amlodipine besylate) .Marland Kitchen... Take one tablet by mouth daily    Carvedilol 6.25 Mg Tabs (Carvedilol) .Marland Kitchen... Take one tablet by mouth twice a day  Complete Medication List: 1)  Alprazolam 0.5 Mg Tabs (Alprazolam) .... Take 1 tablet by mouth two times a day as needed 2)  Nitroglycerin 0.4 Mg Subl (Nitroglycerin) .... Place 1 tablet under tongue as directed 3)  Wellbutrin Sr 150 Mg Tb12 (Bupropion hcl) .... One by mouth twice daily 4)  Simvastatin 20 Mg Tabs (Simvastatin) .... Once daily 5)  Cozaar 50 Mg Tabs (Losartan potassium) .... Take one tablet by mouth daily 6)  Cpap  .... At  bedtime 7)  Imdur 30 Mg Xr24h-tab (Isosorbide mononitrate) .Marland Kitchen.. 1 tab once daily 8)  Furosemide 80 Mg Tabs (Furosemide) .Marland Kitchen.. 1 tab 3 x weekly 9)  Testosterone Cypionate 200 Mg/ml Oil (Testosterone cypionate) .... Inject 1ml intramusculary every month as directed. 10)  Allopurinol 100 Mg Tabs (Allopurinol) .... Take 1 tablet by mouth once a day 11)  Miralax Powd (Polyethylene glycol 3350) .Marland Kitchen.. 17g by mouth once daily as needed constipation 12)  Amlodipine Besylate 10 Mg Tabs (Amlodipine besylate) .... Take one tablet by mouth daily 13)  Carvedilol 6.25 Mg Tabs (Carvedilol) .... Take one tablet by mouth twice a day 14)  Micro-k 10 Meq Cr-caps (Potassium chloride) .Marland Kitchen.. 1 tab 3 x weekly 15)  Hydrocodone-acetaminophen 5-325 Mg Tabs (Hydrocodone-acetaminophen) .Marland Kitchen.. 1 by mouth up to 4 times per day as needed for pain  Patient Instructions: 1)  Schedule follow up with Dr Cato Mulligan in one week. 2)  Keep wounds clean with soap and water. 3)  no further use of peroxide. 4)  Continue topical neosporin.   Orders Added: 1)  Est. Patient Level IV [40981]

## 2010-06-01 NOTE — Assessment & Plan Note (Signed)
Summary: 1 week/f/u//alp   Vital Signs:  Patient profile:   77 year old male BP sitting:   130 / 70  (left arm) Cuff size:   large  Vitals Entered By: Azucena Freed,  MA Student  CC: 1 week f/u Is Patient Diabetic? No   CC:  1 week f/u.  Current Medications (verified): 1)  Alprazolam 0.5 Mg Tabs (Alprazolam) .... Take 1 Tablet By Mouth Two Times A Day As Needed 2)  Nitroglycerin 0.4 Mg Subl (Nitroglycerin) .... Place 1 Tablet Under Tongue As Directed 3)  Wellbutrin Sr 150 Mg  Tb12 (Bupropion Hcl) .... One By Mouth Twice Daily 4)  Simvastatin 20 Mg  Tabs (Simvastatin) .... Once Daily 5)  Cozaar 50 Mg Tabs (Losartan Potassium) .... Take One Tablet By Mouth Daily 6)  Cpap .... At Bedtime 7)  Imdur 30 Mg Xr24h-Tab (Isosorbide Mononitrate) .Marland Kitchen.. 1 Tab Once Daily 8)  Furosemide 80 Mg Tabs (Furosemide) .Marland Kitchen.. 1 Tab 3 X Weekly 9)  Testosterone Cypionate 200 Mg/ml Oil (Testosterone Cypionate) .... Inject 1ml Intramusculary Every Month As Directed. 10)  Allopurinol 100 Mg Tabs (Allopurinol) .... Take 1 Tablet By Mouth Once A Day 11)  Miralax   Powd (Polyethylene Glycol 3350) .Marland Kitchen.. 17g By Mouth Once Daily As Needed Constipation 12)  Amlodipine Besylate 10 Mg Tabs (Amlodipine Besylate) .... Take One Tablet By Mouth Daily 13)  Carvedilol 6.25 Mg Tabs (Carvedilol) .... Take One Tablet By Mouth Twice A Day 14)  Micro-K 10 Meq Cr-Caps (Potassium Chloride) .Marland Kitchen.. 1 Tab 3 X Weekly  Allergies (verified): No Known Drug Allergies   Impression & Recommendations:  Problem # 1:  LACERATION (ICD-879.8) patient with large eschar---removed last visit see culture results add abx side effects discussed see me next week total time 10 minutes ftf with patient and wife >1/2 time counselling regarding infection---slowly healing wounds.   Complete Medication List: 1)  Alprazolam 0.5 Mg Tabs (Alprazolam) .... Take 1 tablet by mouth two times a day as needed 2)  Nitroglycerin 0.4 Mg Subl (Nitroglycerin)  .... Place 1 tablet under tongue as directed 3)  Wellbutrin Sr 150 Mg Tb12 (Bupropion hcl) .... One by mouth twice daily 4)  Simvastatin 20 Mg Tabs (Simvastatin) .... Once daily 5)  Cozaar 50 Mg Tabs (Losartan potassium) .... Take one tablet by mouth daily 6)  Cpap  .... At bedtime 7)  Imdur 30 Mg Xr24h-tab (Isosorbide mononitrate) .Marland Kitchen.. 1 tab once daily 8)  Furosemide 80 Mg Tabs (Furosemide) .Marland Kitchen.. 1 tab 3 x weekly 9)  Testosterone Cypionate 200 Mg/ml Oil (Testosterone cypionate) .... Inject 1ml intramusculary every month as directed. 10)  Allopurinol 100 Mg Tabs (Allopurinol) .... Take 1 tablet by mouth once a day 11)  Miralax Powd (Polyethylene glycol 3350) .Marland Kitchen.. 17g by mouth once daily as needed constipation 12)  Amlodipine Besylate 10 Mg Tabs (Amlodipine besylate) .... Take one tablet by mouth daily 13)  Carvedilol 6.25 Mg Tabs (Carvedilol) .... Take one tablet by mouth twice a day 14)  Micro-k 10 Meq Cr-caps (Potassium chloride) .Marland Kitchen.. 1 tab 3 x weekly 15)  Doxycycline Hyclate 100 Mg Caps (Doxycycline hyclate) .... Take 1 tab twice a day  Other Orders: Admin of patients own med IM/SQ (16109U) Prescriptions: ALPRAZOLAM 0.5 MG TABS (ALPRAZOLAM) Take 1 tablet by mouth two times a day as needed  #60 x 2   Entered by:   Alfred Levins, CMA   Authorized by:   Birdie Sons MD   Signed by:   Alfred Levins,  CMA on 05/15/2010   Method used:   Telephoned to ...       [facility] [phone]* (retail)       [address] [place], [place]  [address]       Ph: [phone]       Fax: [phone]   RxID:   [id number] DOXYCYCLINE HYCLATE 100 MG CAPS (DOXYCYCLINE HYCLATE) Take 1 tab twice a day  #20 x 0   Entered and Authorized by:   Birdie Sons MD   Signed by:   Birdie Sons MD on 05/15/2010   Method used:   Electronically to        [facility] [phone]* (retail)       [address] [place], [place]  [address]       Ph:  [phone]       Fax: [phone]   RxID:   [id number]    Medication Administration  Injection # 1:    Medication: Testosterone Cypionat 200mg  ing    Diagnosis: TESTOSTERONE DEFICIENCY (ICD-257.2)    Route: IM    Site: RUOQ gluteus    Exp Date: 3/13    Lot #: [id number]    Mfr: Claudette Laws    Patient tolerated injection without complications    Given by: Alfred Levins, CMA (May 15, 2010 11:53 AM)  Orders Added: 1)  Admin of patients own med IM/SQ [96372M] 2)  Est. Patient Level II [10932]

## 2010-06-06 ENCOUNTER — Encounter (HOSPITAL_BASED_OUTPATIENT_CLINIC_OR_DEPARTMENT_OTHER): Payer: Medicare Other | Attending: Internal Medicine

## 2010-06-06 DIAGNOSIS — W108XXA Fall (on) (from) other stairs and steps, initial encounter: Secondary | ICD-10-CM | POA: Insufficient documentation

## 2010-06-06 DIAGNOSIS — Y998 Other external cause status: Secondary | ICD-10-CM | POA: Insufficient documentation

## 2010-06-06 DIAGNOSIS — S0100XA Unspecified open wound of scalp, initial encounter: Secondary | ICD-10-CM | POA: Insufficient documentation

## 2010-06-07 NOTE — Assessment & Plan Note (Signed)
Summary: 1 WEEK F/U//ALP   Allergies: No Known Drug Allergies   Impression & Recommendations:  Problem # 1:  LACERATION (ICD-879.8)  patient continues to have hypertrophic past. visit not improved over the past couple of weeks. He has had staph aureus, methicillin resistant isolated from the lesions. given the lack of response to therapy I think he needs further evaluation. refer to  wound center Orders: Wound Care Center Referral (Wound Care)  total time 10 minutes with the patient and his wife. arranged wound Center referral while he was in the office.  Complete Medication List: 1)  Alprazolam 0.5 Mg Tabs (Alprazolam) .... Take 1 tablet by mouth two times a day as needed 2)  Nitroglycerin 0.4 Mg Subl (Nitroglycerin) .... Place 1 tablet under tongue as directed 3)  Wellbutrin Sr 150 Mg Tb12 (Bupropion hcl) .... One by mouth twice daily 4)  Simvastatin 20 Mg Tabs (Simvastatin) .... Once daily 5)  Cozaar 50 Mg Tabs (Losartan potassium) .... Take one tablet by mouth daily 6)  Cpap  .... At bedtime 7)  Imdur 30 Mg Xr24h-tab (Isosorbide mononitrate) .Marland Kitchen.. 1 tab once daily 8)  Furosemide 80 Mg Tabs (Furosemide) .Marland Kitchen.. 1 tab 3 x weekly 9)  Testosterone Cypionate 200 Mg/ml Oil (Testosterone cypionate) .... Inject 1ml intramusculary every month as directed. 10)  Allopurinol 100 Mg Tabs (Allopurinol) .... Take 1 tablet by mouth once a day 11)  Miralax Powd (Polyethylene glycol 3350) .Marland Kitchen.. 17g by mouth once daily as needed constipation 12)  Amlodipine Besylate 10 Mg Tabs (Amlodipine besylate) .... Take one tablet by mouth daily 13)  Carvedilol 6.25 Mg Tabs (Carvedilol) .... Take one tablet by mouth twice a day 14)  Micro-k 10 Meq Cr-caps (Potassium chloride) .Marland Kitchen.. 1 tab 3 x weekly   Orders Added: 1)  Wound Care Center Referral [Wound Care] 2)  Est. Patient Level II [16109]

## 2010-06-14 ENCOUNTER — Other Ambulatory Visit: Payer: Self-pay | Admitting: Internal Medicine

## 2010-07-10 ENCOUNTER — Ambulatory Visit (HOSPITAL_BASED_OUTPATIENT_CLINIC_OR_DEPARTMENT_OTHER)
Admission: RE | Admit: 2010-07-10 | Discharge: 2010-07-10 | Disposition: A | Discharge status: Home / Self Care | Payer: Medicare Other | Source: Ambulatory Visit | Attending: Plastic Surgery | Admitting: Plastic Surgery

## 2010-07-10 DIAGNOSIS — I2510 Atherosclerotic heart disease of native coronary artery without angina pectoris: Secondary | ICD-10-CM | POA: Insufficient documentation

## 2010-07-10 DIAGNOSIS — W108XXA Fall (on) (from) other stairs and steps, initial encounter: Secondary | ICD-10-CM | POA: Insufficient documentation

## 2010-07-10 DIAGNOSIS — Z951 Presence of aortocoronary bypass graft: Secondary | ICD-10-CM | POA: Insufficient documentation

## 2010-07-10 DIAGNOSIS — I10 Essential (primary) hypertension: Secondary | ICD-10-CM | POA: Insufficient documentation

## 2010-07-10 DIAGNOSIS — E7800 Pure hypercholesterolemia, unspecified: Secondary | ICD-10-CM | POA: Insufficient documentation

## 2010-07-10 DIAGNOSIS — J449 Chronic obstructive pulmonary disease, unspecified: Secondary | ICD-10-CM | POA: Insufficient documentation

## 2010-07-10 DIAGNOSIS — J4489 Other specified chronic obstructive pulmonary disease: Secondary | ICD-10-CM | POA: Insufficient documentation

## 2010-07-10 DIAGNOSIS — S0100XA Unspecified open wound of scalp, initial encounter: Secondary | ICD-10-CM | POA: Insufficient documentation

## 2010-07-10 DIAGNOSIS — G4733 Obstructive sleep apnea (adult) (pediatric): Secondary | ICD-10-CM | POA: Insufficient documentation

## 2010-07-10 DIAGNOSIS — Y998 Other external cause status: Secondary | ICD-10-CM | POA: Insufficient documentation

## 2010-07-10 DIAGNOSIS — I739 Peripheral vascular disease, unspecified: Secondary | ICD-10-CM | POA: Insufficient documentation

## 2010-07-10 DIAGNOSIS — N289 Disorder of kidney and ureter, unspecified: Secondary | ICD-10-CM | POA: Insufficient documentation

## 2010-07-10 DIAGNOSIS — Y9229 Other specified public building as the place of occurrence of the external cause: Secondary | ICD-10-CM | POA: Insufficient documentation

## 2010-07-10 LAB — POCT I-STAT, CHEM 8
Chloride: 103 mEq/L (ref 96–112)
HCT: 44 % (ref 39.0–52.0)
Hemoglobin: 15 g/dL (ref 13.0–17.0)
Potassium: 3.8 mEq/L (ref 3.5–5.1)
Sodium: 140 mEq/L (ref 135–145)

## 2010-07-11 LAB — POCT HEMOGLOBIN-HEMACUE: Hemoglobin: 15.7 g/dL (ref 13.0–17.0)

## 2010-07-14 NOTE — Op Note (Signed)
  NAME:  [name], [name]:  [id number]  MEDICAL RECORD NO.:  [id number]           PATIENT TYPE:  LOCATION:                                 FACILITY:  PHYSICIAN:  Wayland Denis, DO      DATE OF BIRTH:  19-Aug-1928  DATE OF PROCEDURE:  07/10/2010 DATE OF DISCHARGE:                              OPERATIVE REPORT   PREOPERATIVE DIAGNOSIS:  Scalp wound x2.  POSTOPERATIVE DIAGNOSIS:  Scalp wound x2.  PROCEDURE:  Irrigation and debridement of scalp wound, anterior 2.5 x 1.5 cm, and posterior is 2.5 x 3.5 cm with placement of Apligraf for above size, 1 Apligraf used.  ATTENDING:  Wayland Denis, DO  ANESTHESIA:  General.  INDICATIONS FOR PROCEDURE:  Mr. Harold [name] is a 77 year old who fell on an escalator and sustained multiple wounds, but 2 have healed.  He is picking at them and decision was made to bring him to the OR.  DESCRIPTION OF PROCEDURE:  The patient was brought to the OR, placed on the operating room table in supine position.  General anesthesia was administered.  Once adequate time-out was called, all information was confirmed to be correct.  His scalp was prepped with Betadine and draped in the usual sterile fashion.  The wounds size as described above.  A 10 blade was used to freshen the skin edges and to remove portion of subcutaneous tissue.  There was no bone visible.  A 1% lidocaine with epinephrine was injected to help obtain hemostasis and postop anesthesia.  Antibiotic solution was used to irrigate the wound.  The Apligraf was then placed and the sites were Dermabonded.  Small fenestrations were placed to decrease the fluid build up underneath and an Adaptic was placed over it and secured with staples.  ABD and Kerlix were applied.  The patient tolerated the procedure well.  There were no complications.  He was awoken and taken to recovery room in stable condition.     Wayland Denis, DO     CS/MEDQ  D:  07/10/2010  T:  07/11/2010   Job:  [id number]  Electronically Signed by Wayland Denis  on 07/14/2010 01:28:23 PM

## 2010-07-18 ENCOUNTER — Encounter (HOSPITAL_BASED_OUTPATIENT_CLINIC_OR_DEPARTMENT_OTHER): Payer: Medicare Other | Attending: Internal Medicine

## 2010-07-18 DIAGNOSIS — Y998 Other external cause status: Secondary | ICD-10-CM | POA: Insufficient documentation

## 2010-07-18 DIAGNOSIS — W108XXA Fall (on) (from) other stairs and steps, initial encounter: Secondary | ICD-10-CM | POA: Insufficient documentation

## 2010-07-18 DIAGNOSIS — S0100XA Unspecified open wound of scalp, initial encounter: Secondary | ICD-10-CM | POA: Insufficient documentation

## 2010-07-21 NOTE — Consult Note (Signed)
NAME:  HERON, PITCOCK NO.:  [id number]  MEDICAL RECORD NO.:  [id number]           PATIENT TYPE:  I  LOCATION:  FOOT                         FACILITY:  [facility]  PHYSICIAN:  Barry Dienes. Eloise Harman, M.D.DATE OF BIRTH:  May 29, 1928  DATE OF CONSULTATION: DATE OF DISCHARGE:                                CONSULTATION   PHYSICIAN REQUESTING CONSULTATION:  Bruce H. Swords, MD  INDICATION FOR CONSULTATION:  Evaluation of two lesions on the scalp.  HISTORY OF PRESENT ILLNESS:  The patient is a 77 year old Caucasian man who on March 13, 2010 had a fall on an escalator at [employer]. Apparently he fell down the escalator and had a brief loss of consciousness with significant bleeding.  He was transported to the emergency room and treated with topical and systemic antibiotics. Subsequently, he was seen by his primary care physician with most recent visit on May 15, 2010.  After he was treated with doxycycline for methicillin-sensitive Staphylococcus aureus that grew out from his posterior most ulcer.  The wound had not improved significantly and he was sent for [facility] evaluation.  He has not had recent fever, chills, or pain at the ulcers.  His wife notes that he tends to pick at the ulcers frequently despite advice not to do so.  PAST MEDICAL HISTORY: 1. Coronary artery disease with myocardial infarction in 1990. 2. Gout. 3. Hypertension. 4. Chronic kidney disease. 5. Abdominal aortic aneurysm. 6. Macular degeneration. 7. Atherosclerotic peripheral vascular disease. 8. Obstructive sleep apnea, for which, he is on CPAP. 9. Hyperlipidemia. 10.Congestive heart failure due to diastolic dysfunction, status post     1990 CABG surgery and multiple angioplasties with last     catheterization in 2010 showing only a patent LIMA to LAD.  Left     ventricular ejection fraction 55%. 11.History of basal cell skin cancer with excisions on the right side     of the face and  scalp. 12.Depression. 13.Hypertension.  PAST SURGICAL HISTORY:  Abdominal aortic aneurysm repair, transurethral resection of the prostate, CABG and cholecystectomy as well as disk surgeries x5, TUNA, cardiac stent procedure in December 2002.  CURRENT MEDICATIONS: 1. Alprazolam 0.5 mg p.o. b.i.d. 2. Nitroglycerin 0.4 mg sublingual p.r.n. 3. Wellbutrin SR 150 mg by mouth twice daily. 4. Simvastatin 20 mg daily. 5. Cozaar 50 mg daily. 6. CPAP nightly. 7. Imdur 30 mg daily. 8. Lasix 80 mg p.o. three times weekly. 9. Testosterone 200 mg IM once monthly. 10.Allopurinol 100 mg p.o. daily. 11.MiraLax 17 g p.o. daily p.r.n. constipation. 12.Amlodipine 10 mg p.o. daily. 13.Carvedilol 6.25 mg p.o. twice daily. 14.Micro-K 10 mEq p.o. three times per week.  ALLERGIES:  No known drug allergies.  FAMILY HISTORY:  His father died from emphysema.  He has two uncles that have had coronary artery disease.  SOCIAL HISTORY:  He is married with children and is a former smoker that stopped in 1990 after 104-pack year history of smoking.  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  INITIAL PHYSICAL EXAMINATION:  VITAL SIGNS:  Blood pressure 158/82, pulse 73, respirations 20, temperature 98.7. GENERAL:  The patient is a well-nourished, well-developed,  elderly white man who is in no apparent distress while sitting upright on a exam chair. HEAD, EYES, EARS, NOSE AND THROAT:  Significant for two ulcers on the left side of the scalp as described below. NECK:  Supple without jugular venous distention or carotid bruit. CHEST:  Clear to auscultation. HEART:  Regular rate and rhythm. ABDOMEN:  Benign. EXTREMITIES:  Without edema.  CONDITION OF THE SKIN:  Wound #1:  This is located on the anterior left scalp and the forehead region and measures 2.0 cm x 2.1 cm x 0.1 cm. The ulcerated area was initially covered 100% with eschar and the measurements are as described after the eschar was debrided.  The  wound base was red and pink in color with hypergranulation tissue.  There was no significant odor and there was a moderate amount of serous drainage with an intact periwound integrity. Wound #2:  This is located on the left posterior scalp in the parietal region and after debridement of a thick covering eschar, measures 3.6 cm x 2.9 cm x 0.1 cm.  The wound base did not have significant necrotic tissue.  There was some yellow creamish material in the wound base that was easily removed.  The wound base had pink and red material and hypergranulation tissue.  There was no exposed bone, tendon, or muscle, and no foul odor.  There was a moderate amount of purulent drainage and an intact periwound integrity.  ASSESSMENT:  Two ulcers on the left scalp with delayed healing likely from development of hypergranulation tissue as well as topical infection on the posterior most lesion.  PLAN:  After the eschar was removed from each of the lesions, they were treated with silver nitrate topical treatment to cause ischemia and necrosis of the hypergranulation tissue.  Following this, they were covered with silver alginate dressings that will be reapplied three times weekly.  We will plan on a followup visit in approximately 1 week, at which time, it will be likely that we will reapply silver nitrate to the areas.          ______________________________ Barry Dienes. Eloise Harman, M.D.     DGP/MEDQ  D:  06/06/2010  T:  06/07/2010  Job:  [id number]  Electronically Signed by Jarome Matin M.D. on 06/20/2010 07:44:03 AM

## 2010-08-01 ENCOUNTER — Encounter (HOSPITAL_BASED_OUTPATIENT_CLINIC_OR_DEPARTMENT_OTHER): Payer: Medicare Other | Attending: Internal Medicine

## 2010-08-01 DIAGNOSIS — S0100XA Unspecified open wound of scalp, initial encounter: Secondary | ICD-10-CM | POA: Insufficient documentation

## 2010-08-01 DIAGNOSIS — W108XXA Fall (on) (from) other stairs and steps, initial encounter: Secondary | ICD-10-CM | POA: Insufficient documentation

## 2010-08-01 DIAGNOSIS — Y998 Other external cause status: Secondary | ICD-10-CM | POA: Insufficient documentation

## 2010-08-04 LAB — POCT I-STAT, CHEM 8
Chloride: 102 mEq/L (ref 96–112)
HCT: 48 % (ref 39.0–52.0)
Hemoglobin: 16.3 g/dL (ref 13.0–17.0)
Potassium: 4 mEq/L (ref 3.5–5.1)

## 2010-08-08 LAB — POCT I-STAT 3, VENOUS BLOOD GAS (G3P V)
O2 Saturation: 55 %
pCO2, Ven: 43.2 mmHg — ABNORMAL LOW (ref 45.0–50.0)
pH, Ven: 7.348 — ABNORMAL HIGH (ref 7.250–7.300)

## 2010-08-08 LAB — DIFFERENTIAL
Basophils Absolute: 0.1 10*3/uL (ref 0.0–0.1)
Basophils Relative: 1 % (ref 0–1)
Eosinophils Absolute: 0.1 10*3/uL (ref 0.0–0.7)
Eosinophils Relative: 2 % (ref 0–5)
Monocytes Absolute: 1.3 10*3/uL — ABNORMAL HIGH (ref 0.1–1.0)
Monocytes Relative: 16 % — ABNORMAL HIGH (ref 3–12)
Neutro Abs: 5.7 10*3/uL (ref 1.7–7.7)

## 2010-08-08 LAB — LIPID PANEL
LDL Cholesterol: 109 mg/dL — ABNORMAL HIGH (ref 0–99)
Total CHOL/HDL Ratio: 5.8 RATIO
VLDL: 20 mg/dL (ref 0–40)

## 2010-08-08 LAB — COMPREHENSIVE METABOLIC PANEL
ALT: 10 U/L (ref 0–53)
Albumin: 3.4 g/dL — ABNORMAL LOW (ref 3.5–5.2)
Alkaline Phosphatase: 85 U/L (ref 39–117)
BUN: 14 mg/dL (ref 6–23)
Chloride: 106 mEq/L (ref 96–112)
Glucose, Bld: 110 mg/dL — ABNORMAL HIGH (ref 70–99)
Potassium: 3.7 mEq/L (ref 3.5–5.1)
Sodium: 139 mEq/L (ref 135–145)
Total Bilirubin: 1 mg/dL (ref 0.3–1.2)
Total Protein: 6.3 g/dL (ref 6.0–8.3)

## 2010-08-08 LAB — URINALYSIS, MICROSCOPIC ONLY
Glucose, UA: NEGATIVE mg/dL
Nitrite: NEGATIVE
Specific Gravity, Urine: 1.016 (ref 1.005–1.030)
pH: 7 (ref 5.0–8.0)

## 2010-08-08 LAB — BASIC METABOLIC PANEL
BUN: 14 mg/dL (ref 6–23)
BUN: 19 mg/dL (ref 6–23)
CO2: 28 mEq/L (ref 19–32)
Chloride: 101 mEq/L (ref 96–112)
Chloride: 101 mEq/L (ref 96–112)
Chloride: 102 mEq/L (ref 96–112)
Chloride: 105 mEq/L (ref 96–112)
Creatinine, Ser: 1.49 mg/dL (ref 0.4–1.5)
Creatinine, Ser: 1.51 mg/dL — ABNORMAL HIGH (ref 0.4–1.5)
Creatinine, Ser: 1.8 mg/dL — ABNORMAL HIGH (ref 0.4–1.5)
GFR calc Af Amer: 44 mL/min — ABNORMAL LOW (ref >60)
GFR calc Af Amer: 54 mL/min — ABNORMAL LOW (ref >60)
GFR calc Af Amer: 57 mL/min — ABNORMAL LOW (ref >60)
GFR calc non Af Amer: 47 mL/min — ABNORMAL LOW (ref >60)
Glucose, Bld: 93 mg/dL (ref 70–99)
Potassium: 3.1 mEq/L — ABNORMAL LOW (ref 3.5–5.1)
Potassium: 3.6 mEq/L (ref 3.5–5.1)
Potassium: 4 mEq/L (ref 3.5–5.1)
Sodium: 139 mEq/L (ref 135–145)
Sodium: 141 mEq/L (ref 135–145)

## 2010-08-08 LAB — POCT CARDIAC MARKERS
CKMB, poc: 1 ng/mL (ref 1.0–8.0)
Myoglobin, poc: 123 ng/mL (ref 12–200)
Troponin i, poc: <0.05 ng/mL (ref 0.00–0.09)
Troponin i, poc: <0.05 ng/mL (ref 0.00–0.09)

## 2010-08-08 LAB — CARDIAC PANEL(CRET KIN+CKTOT+MB+TROPI)
CK, MB: 2.5 ng/mL (ref 0.3–4.0)
CK, MB: 2.7 ng/mL (ref 0.3–4.0)
Relative Index: INVALID (ref 0.0–2.5)
Relative Index: INVALID (ref 0.0–2.5)
Total CK: 97 U/L (ref 7–232)
Troponin I: 0.05 ng/mL (ref 0.00–0.06)
Troponin I: 0.06 ng/mL (ref 0.00–0.06)
Troponin I: 0.08 ng/mL — ABNORMAL HIGH (ref 0.00–0.06)

## 2010-08-08 LAB — CBC
HCT: 46.2 % (ref 39.0–52.0)
HCT: 46.9 % (ref 39.0–52.0)
Hemoglobin: 14.8 g/dL (ref 13.0–17.0)
Hemoglobin: 16 g/dL (ref 13.0–17.0)
MCV: 87.4 fL (ref 78.0–100.0)
Platelets: 135 10*3/uL — ABNORMAL LOW (ref 150–400)
Platelets: 145 10*3/uL — ABNORMAL LOW (ref 150–400)
RBC: 4.93 MIL/uL (ref 4.22–5.81)
RDW: 19.2 % — ABNORMAL HIGH (ref 11.5–15.5)
WBC: 7.7 10*3/uL (ref 4.0–10.5)
WBC: 8.2 10*3/uL (ref 4.0–10.5)
WBC: 8.9 10*3/uL (ref 4.0–10.5)

## 2010-08-08 LAB — POCT I-STAT 3, ART BLOOD GAS (G3+)
Acid-Base Excess: 1 mmol/L (ref 0.0–2.0)
Bicarbonate: 25 mEq/L — ABNORMAL HIGH (ref 20.0–24.0)
O2 Saturation: 94 %
pCO2 arterial: 38.1 mmHg (ref 35.0–45.0)
pO2, Arterial: 71 mmHg — ABNORMAL LOW (ref 80.0–100.0)

## 2010-08-08 LAB — GLUCOSE, CAPILLARY
Glucose-Capillary: 110 mg/dL — ABNORMAL HIGH (ref 70–99)
Glucose-Capillary: 111 mg/dL — ABNORMAL HIGH (ref 70–99)

## 2010-08-08 LAB — TSH: TSH: 2.9 u[IU]/mL (ref 0.350–4.500)

## 2010-08-29 ENCOUNTER — Telehealth: Payer: Self-pay | Admitting: Internal Medicine

## 2010-08-29 ENCOUNTER — Encounter (HOSPITAL_BASED_OUTPATIENT_CLINIC_OR_DEPARTMENT_OTHER): Payer: Medicare Other | Attending: General Surgery

## 2010-08-29 DIAGNOSIS — L98499 Non-pressure chronic ulcer of skin of other sites with unspecified severity: Secondary | ICD-10-CM | POA: Insufficient documentation

## 2010-08-29 NOTE — Telephone Encounter (Signed)
Friday 745 a.m.

## 2010-08-29 NOTE — Telephone Encounter (Signed)
Wife would like for her husband  to be worked in to see Dr Cato Mulligan for either this Thursday or Friday for congestion. Wife refused for her husband to see another doctor after being offered several times to see another md.

## 2010-08-29 NOTE — Telephone Encounter (Signed)
Left message on answering machine with time of appt.

## 2010-09-01 ENCOUNTER — Encounter: Payer: Self-pay | Admitting: Internal Medicine

## 2010-09-01 ENCOUNTER — Ambulatory Visit: Payer: No Typology Code available for payment source | Admitting: Internal Medicine

## 2010-09-01 ENCOUNTER — Ambulatory Visit (INDEPENDENT_AMBULATORY_CARE_PROVIDER_SITE_OTHER): Payer: Medicare Other | Admitting: Internal Medicine

## 2010-09-01 VITALS — BP 152/80 | HR 79 | Wt 211.0 lb

## 2010-09-01 DIAGNOSIS — R05 Cough: Secondary | ICD-10-CM

## 2010-09-01 MED ORDER — FLUTICASONE PROPIONATE 50 MCG/ACT NA SUSP: 2.0000 | Freq: Every day | NASAL | Status: DC

## 2010-09-03 ENCOUNTER — Encounter: Payer: Self-pay | Admitting: Internal Medicine

## 2010-09-03 DIAGNOSIS — R05 Cough: Secondary | ICD-10-CM | POA: Insufficient documentation

## 2010-09-03 NOTE — Assessment & Plan Note (Signed)
Obtain chest x-ray. Consider possible allergic rhinitis etiology. Begin Flonase.Followup if no improvement or worsening.

## 2010-09-03 NOTE — Progress Notes (Signed)
  Subjective:    Patient ID: Harold [name], male    DOB: 1928/10/04, 77 y.o.   MRN: [id number]  HPI Patient presents to clinic for evaluation of cough Notes several month history of chronic intermittent cough worse at night. Patient feels is associated with nasal drainage. Denies GERD symptoms. No hemoptysis shortness of breath or wheezing. Takes Mucinex with mild improvement. No other exacerbating or alleviating factors. Taking no other medications for the problem.No other complaints.  Patient presents to clinic for evaluation of  Review of Systems  Constitutional: Negative for fever, chills and diaphoresis.  HENT: Positive for congestion, rhinorrhea and postnasal drip. Negative for facial swelling and sinus pressure.   Respiratory: Positive for cough. Negative for shortness of breath and wheezing.        Objective:   Physical Exam  Nursing note and vitals reviewed. Constitutional: He appears well-developed and well-nourished.  HENT:  Head: Normocephalic.  Right Ear: Tympanic membrane, external ear and ear canal normal.  Left Ear: Tympanic membrane, external ear and ear canal normal.  Nose: Nose normal.  Mouth/Throat: Oropharynx is clear and moist. No oropharyngeal exudate.  Eyes: Conjunctivae are normal. Right eye exhibits no discharge. Left eye exhibits no discharge. No scleral icterus.  Neck: Neck supple. No JVD present.  Cardiovascular: Normal rate, regular rhythm and normal heart sounds.   Pulmonary/Chest: Effort normal and breath sounds normal. No respiratory distress. He has no wheezes. He has no rales.  Lymphadenopathy:    He has no cervical adenopathy.  Neurological: He is alert.  Skin: Skin is warm and dry.  Psychiatric: He has a normal mood and affect.          Assessment & Plan:

## 2010-09-04 ENCOUNTER — Other Ambulatory Visit: Payer: Self-pay | Admitting: *Deleted

## 2010-09-04 DIAGNOSIS — F419 Anxiety disorder, unspecified: Secondary | ICD-10-CM

## 2010-09-04 MED ORDER — ALPRAZOLAM 0.5 MG PO TABS: 0.5000 mg | ORAL_TABLET | Freq: Two times a day (BID) | ORAL | Status: DC | PRN

## 2010-09-05 ENCOUNTER — Telehealth: Payer: Self-pay | Admitting: Internal Medicine

## 2010-09-05 NOTE — Telephone Encounter (Signed)
Order was placed on the 4th of May. Pt's wife aware

## 2010-09-05 NOTE — Telephone Encounter (Signed)
Pt came in for ov on 09/01/10 to see Dr Rodena Medin and was told to get a chest xray, but pt said no order was given to get this done.Marland Kitchen Pts wife is wondering if this order for xray can be called in to [facility] today or print out order for pts wife to pick up.

## 2010-09-12 NOTE — Assessment & Plan Note (Signed)
[facility]                            CARDIOLOGY OFFICE NOTE   NAME:Pokorney, AGRON SWINEY                   MRN:          [id number]  DATE:06/03/2007                            DOB:          12/13/1928    PRIMARY CARE PHYSICIAN:  Valetta Mole. Swords, MD   CLINICAL HISTORY:  Mr. Marinello is 77 years old and returns for  management of his coronary heart disease.  He had bypass surgery in 1990  and has had multiple percutaneous interventions, the last of which was  stenting of the LAD through the LIMA graft at the anastomosis of the  LIMA graft to the LAD.  He had no evidence of restenosis at follow-up  catheterization although he does have an occluded stent to a diagonal  branch of the LAD and he has occlusion of his marginal branch of the  circumflex artery, which are a potential source of ischemia.   He had been doing quite well from his heart.  He has had no recent chest  pain or palpitations.  He does get short of breath with exertion, but  this has not changed.   His biggest problem is related to his vision.  He apparently has macular  degeneration and his vision has been getting worse and suddenly got  worse.  He had been seen at [facility] but is now being seen by Dr.  Sharee Pimple at [facility].  He has lost vision in one eye and is in  danger losing it in the other.   PAST MEDICAL HISTORY:  1. Abdominal aortic aneurysm repair by Dr. Arbie Cookey.  He had associated      peripheral vascular disease with reduced indices.  2. Chronic renal insufficiency with creatinine in the range of 1.8.  3. Hypertension.  4. Hyperlipidemia.  5. Obstructive sleep apnea.   CURRENT MEDICATIONS:  1. Ocuvite.  2. Simvastatin.  3. Wellbutrin.  4. Micardis 80 mg one-half b.i.d.  5. Norvasc 10 daily.  6. Coreg CR 20 mg daily.  7. Lasix 40 mg daily.   EXAMINATION:  Blood pressure 180/100 although he says it has been in the  140 range at home.  The pulse was  86.  There was no venous distention.  The carotid pulses were full.  CHEST:  Clear without rales or rhonchi.  Breath sounds are somewhat  decreased.  CARDIAC:  Rhythm is regular.  There is a short systolic murmur at the  left sternal edge.  ABDOMEN:  soft with normal bowel sounds.  There is no  hepatosplenomegaly.  There was 1+ edema bilaterally.  Pedal pulses were somewhat reduced.  There were some stasis changes of the lower extremities.   IMPRESSION:  1. Coronary artery disease, status post coronary bypass graft surgery      in 1990 and status post multiple percutaneous interventions as      described above, with coronary anatomy as described above.  2. Good left ventricular function.  3. Status post abdominal aortic aneurysm repair with residual      peripheral vascular disease and reduced indices bilaterally.  4. Chronic renal insufficiency  with a creatinine of 1.8.  5. Hypertension.  6. Hyperlipidemia.  7. Obstructive sleep apnea.  8. Lower extremity edema related to venous insufficiency and Norvasc.  9. Macular degeneration with significant loss of vision.   RECOMMENDATIONS:  I think Mr. Pinkham is doing well from the  standpoints of his heart.  His blood pressure is quite high today but  apparently has been better at home but still not at target.  We will  increase his Coreg CR from 20 mg to 40 mg a day.  He is scheduled to Dr.  Cato Mulligan in 2 days and he can make further adjustments as needed.  I will  plan to see him back in follow-up in 6 months.     Bruce Elvera Lennox Juanda Chance, MD, [facility]  Electronically Signed    BRB/MedQ  DD: 06/03/2007  DT: 06/04/2007  Job #: [id number]

## 2010-09-12 NOTE — Cardiovascular Report (Signed)
NAMEELIJAH, PHOMMACHANH NO.:  [id number]   MEDICAL RECORD NO.:  [id number]          PATIENT TYPE:  INP   LOCATION:  2021                         FACILITY:  [facility]   PHYSICIAN:  Everardo Beals. Juanda Chance, MD, FACCDATE OF BIRTH:  Nov 04, 1928   DATE OF PROCEDURE:  09/06/2008  DATE OF DISCHARGE:                            CARDIAC CATHETERIZATION   CLINICAL HISTORY:  Mr. People is 77 years old and has had previous  bypass and multiple percutaneous coronary interventions.  He was  admitted to the hospital with congestive heart failure and also had  borderline positive troponins.  He has been diuresed and now he is being  evaluated with right and left heart catheterization and the graft study.   PROCEDURE:  The procedure was performed via the femoral artery using  arterial sheath and 5-French preformed coronary catheters.  Right heart  catheterization was performed percutaneously via the right femoral vein  using venous sheath and Swan-Ganz thermodilution catheter.  We used a  LIMA catheter injection of the LIMA graft.  We used an AL-1 catheter for  injection of the vein graft to the diagonal branch to the LAD.  The  patient tolerated the procedure well and left the laboratory in  satisfactory condition.   RESULTS:  Hemodynamic data:  The right atrial pressure was 4 mean.  The  pulmonary artery pressure was 40/60 with a mean of 26.  The pulmonary  wedge pressure was 16 mean.  The left ventricular pressure was 137/20.  The aortic pressure was 137/70 with a mean of 96.  The cardiac  output/cardiac index was 3.3/1.6 L/min/m2 by Fick.   ANGIOGRAPHIC DATA:  Left main coronary artery:  The left main coronary  artery had a 70% mid stenosis.   Left anterior descending artery:  The left anterior descending artery  was completely occluded after the septal perforator proximally.   Circumflex artery:  The circumflex artery was completely occluded after  two small marginal branches and  atrial branch.   Right coronary artery:  The right coronary artery was completely  occluded at its midportion.   The saphenous vein graft to the right coronary artery was completely  occluded at its origin.  This represented a new occlusion from the last  study.  There were collaterals to the posterior descending branch and  posterolateral branch from the LAD through the LIMA and from the native  circumflex artery.   The saphenous vein graft to the circumflex artery was completely  occluded near its origin.  This was an old occlusion.   The saphenous vein graft to the diagonal branch of the LAD was  completely occluded just before the anastomoses to the diagonal.  This  was an old occlusion.   The LIMA graft to the LAD was patent.  There was 40% narrowing within  the stent at the site of the anastomosis.  This LAD was free of  significant disease.  This LAD filled the diagonal branch and filled the  posterior descending branch via collaterals.   No left ventriculogram was performed.   Aortic root injection:  The aortic root injection showed no  evidence of  renal artery stenosis.  The aortobiliac graft was patent and seen to be  functioning well.   CONCLUSION:  1. Coronary artery disease status post prior coronary artery bypass      graft surgery.  2. Severe native vessel disease with 70% narrowing in the left main      coronary artery, total occlusion in the left anterior descending      artery, total occlusion of the circumflex artery, and total      occlusion of the right coronary.  3. Patent left internal mammary artery graft to left anterior      descending with 40% narrowing within the stent at the anastomosis,      occluded vein graft to diagonal branch of the left anterior      descending, occluded vein graft to the circumflex artery, and      occluded vein graft to the right coronary artery.  4. Borderline elevated left ventricular filling pressures.    RECOMMENDATIONS:  Mr. Quach has occluded all of his vein grafts.  His LIMA remains patent and supplies blood to his heart.  He is not a  good candidate for percutaneous coronary intervention.  He has multiple  comorbidities which will make him not a very good surgical candidate  either.  We will plan to continue treatment of his congestive heart  failure.  We will get the results of his echocardiogram.  He may be a  candidate for an ICD.      Bruce Elvera Lennox Juanda Chance, MD, [facility]  Electronically Signed     BRB/MEDQ  D:  09/06/2008  T:  09/07/2008  Job:  [id number]   cc:   Valetta Mole. Swords, MD

## 2010-09-12 NOTE — Assessment & Plan Note (Signed)
[facility]                            CARDIOLOGY OFFICE NOTE   NAME:[name], [name]                   MRN:          [id number]  DATE:12/02/2006                            DOB:          [birth year]    PRIMARY CARE PHYSICIAN:  Dr. Birdie Sons.   CLINICAL HISTORY:  Harold [name] returned for followup visit for  management of his coronary heart disease.  He had bypass surgery in 1990  and has had multiple percutaneous interventions, the last of which was  stenting of the LAD at the anastomosis of the LIMA graft to the LAD.  He  had a scan which suggested ischemia and had a followup catheterization  which was performed in January 2008.  This showed that he had occluded  the stent at the anastomosis of the vein graft at the diagonal branch  and LAD.  He had a previously total occlusion of a marginal branch and  circumflex artery.  Both of these were filled well by collaterals.  The  LIMA to the LAD and the stent at the anastomotic site was open, and the  vein graft to the right with a stent in the vein graft was open.  His LV  function has been good.   He has been doing fairly well.  He is working in the rehab program.  He  has some limitations as to what he can do with exercise because of a  right knee problem.  He has had no chest pain and palpitations.  He has  had some edema in the lower extremities which is somewhat worse than it  has been in the past.  He indicated that his blood pressures have been  occasionally as high as 160-170 but usually run about 150.   PAST MEDICAL HISTORY:  1. Abdominal aortic aneurysm repaired by Dr. Arbie Cookey.  2. He has chronic renal insufficiency with creatinines in the range of      1.8.  3. He has a history of hypertension, hyperlipidemia, and obstructive      sleep apnea.   CURRENT MEDICATIONS:  Aspirin, simvastatin, Plavix, Wellbutrin,  Micardis, Norvasc, Coreg, and Lasix.   EXAMINATION:  The blood pressure is  150/88 and the pulse 66 and regular.  There is no venous distension.  The carotid pulses were full without  bruits.  CHEST:  Clear without rales and rhonchi.  The current rhythm is regular.  I can detect no murmurs or gallops.  ABDOMEN:  Protuberant.  The abdomen was soft, and there were normal  bowel sounds and no hepatosplenomegaly.  There was 1+ edema in the lower extremities which was pitting.  I have  difficulty feeling pedal pulses.   Electrocardiogram showed sinus rhythm with lateral ST-T changes.   IMPRESSION:  1. Coronary artery disease status post coronary artery bypass graft      surgery in 1990 and status post multiple percutaneous interventions      with coronary anatomy as described above.  2. Good left ventricular function.  3. Status post abdominal aortic aneurysm repair with residual      peripheral vascular disease.  4. Chronic renal insufficiency with creatinines of 1.8.  5. Hypertension.  6. Hyperlipidemia.  7. Obstructive sleep apnea.  8. Lower extremity edema probably a combination of venous      insufficiency and Norvasc.   RECOMMENDATIONS:  I think Harold [name] is doing well from a cardiac  standpoint.  Dr. Cato Mulligan is titrating his Coreg in attempts to improve  his blood pressure response.  I am sure the Norvasc is contributing to  the edema, but it does not look like we can get hm off that now since  the other medications do not appear adequate to control his blood  pressure.  He plans to see Dr. Cato Mulligan back at the end of this month, and  I will see him back in 6 months.     Harold Elvera Lennox Juanda Chance, MD, [facility]  Electronically Signed    BRB/MedQ  DD: 12/02/2006  DT: 12/02/2006  Job #: [id number]   cc:   Valetta Mole. Swords, MD

## 2010-09-12 NOTE — Assessment & Plan Note (Signed)
[facility]                            CARDIOLOGY OFFICE NOTE   NAME:Neff, KAYSHAWN OZBURN                   MRN:          [id number]  DATE:12/23/2007                            DOB:          [birth year]    PRIMARY CARE PHYSICIAN:  Valetta Mole. Swords, MD   CLINICAL HISTORY:  Mr. Creque is 77 years old and returns for  management with coronary heart disease.  He had bypass surgery in 1990,  and has had multiple percutaneous interventions.  His last  catheterization was performed in January 2008, at which time he had  patent vein graft to the right coronary artery with less than 10%  narrowing at the stent site and occluded vein graft to diagonal branch  of the LAD at the stent site and occluded vein graft to the circumflex  artery and a patent LIMA graft to LAD with less than 10% narrowing at  the stent site at the anastomosis to the LAD.  His LV function is good.  He has been doing fairly well from the standpoint of his heart and no  recent chest pain, palpitations, or change in shortness of breath.  He  does have some chronic lower extremity edema.   PAST MEDICAL HISTORY:  Significant for macular degeneration, is being  seen monthly at [facility] and getting shots for this.  He also has  been treated for actinic keratoses.  He has had a previous abdominal  aneurysm repair by Dr. Arbie Cookey and has peripheral vascular disease with  reduced indices.  He has chronic renal insufficiency.  Creatinine is in  the range of 1.8, hypertension, hyperlipidemia, and obstructive sleep  apnea.   CURRENT MEDICATIONS:  1. Alprazolam.  2. Nitroglycerin.  3. Wellbutrin.  4. Colchicine.  5. Simvastatin.  6. AndroGel.  7. Amlodipine 5 mg daily.  8. Hydrochlorothiazide 25 mg daily.  9. Testosterone.  10.Metoprolol 25 mg b.i.d.  11.Benicar 20 mg a day, which was recently started by Dr. Cato Mulligan.   PHYSICAL EXAMINATION:  VITAL SIGNS:  On examination today the blood  pressure was 164/87, pulse 63 and regular.  NECK:  There was no venous distension.  The carotid pulses were full.  CHEST:  Clear without rales or rhonchi, but the breath sounds were  decreased.  CARDIAC:  Rhythm was regular.  The heart sounds were decreased, there  were no murmurs.  ABDOMEN:  Protuberant.  Soft with normal bowel sounds and no  splenomegaly.  There was 1+ edema of the ankles and trace edema of  pretibial region.   Electrocardiogram showed minor nonspecific ST-T changes.   IMPRESSION:  1. Coronary artery disease status post coronary artery bypass graft      surgery and multiple percutaneous coronary interventions with      anatomy as described above.  2. Good left ventricular function.  3. Status post abdominal aneurysm repair with residual  peripheral      vascular disease and reduced indices.  4. Chronic renal insufficiency with creatinine in range 1.8.  5. Hypertension under optimal control.  6. Hyperlipidemia.  7. Obstructive sleep apnea.  8. Lower extremity  edema related to venous insufficiency and      arthritis.  9. Macular degeneration.   RECOMMENDATIONS:  I think Mr. Abernethy is doing well from standpoint of  his heart.  His blood pressure is still not at target.  We will get a  BMP today and increase his Benicar from 20-40 a day with a followup BMP  and blood pressure check in 1 week.  I will see him back in 1 year.  Dr.  Cato Mulligan will see him back in the interim for primary care.     Bruce Elvera Lennox Juanda Chance, MD, [facility]  Electronically Signed    BRB/MedQ  DD: 12/23/2007  DT: 12/24/2007  Job #: [id number]

## 2010-09-12 NOTE — Discharge Summary (Signed)
Harold [name], Harold NO.:  [id number]   MEDICAL RECORD NO.:  [id number]          PATIENT TYPE:  INP   LOCATION:  2021                         FACILITY:  [facility]   PHYSICIAN:  Everardo Beals. Juanda Chance, MD, FACCDATE OF BIRTH:  21-May-1928   DATE OF ADMISSION:  09/04/2008  DATE OF DISCHARGE:  09/08/2008                               DISCHARGE SUMMARY   PROCEDURES:  1. Cardiac catheterization.  2. Coronary arteriogram.  3. A 2-D echocardiogram.   PRIMARY FINAL DISCHARGE DIAGNOSIS:  Chest pain, patent grafts,  catheterization, and medical therapy recommended.   SECONDARY DIAGNOSES:  1. Acute diastolic congestive heart failure, Lasix added.  2. Status post aortocoronary bypass surgery in 1990 with left internal      mammary artery to left anterior descending, saphenous vein graft to      diagonal, saphenous vein graft to obtuse marginal, saphenous vein      graft to right coronary artery (left internal mammary artery to      left anterior descending only patent graft).  3. Status post cardiac catheterization in this admission showing right      coronary artery, circumflex, and left anterior descending totaled.  4. Status post remote history of stents to the diagonal and left      anterior descending.  5. History of abdominal aortic aneurysm with repair.  6. Macular degeneration.  7. Hyperlipidemia.  8. Sleep apnea on continuous positive airway pressure.  9. Depression.  10.Chronic kidney disease, stage III with a glomerular filtration rate      of 37 at discharge.  11.Remote history of ethyl alcohol and tobacco use.  12.History of renal adenoma and nephrolithiasis.  13.History of basal cell skin cancer.  14.Status post back surgery.  15.Possible depression symptoms, follow up with primary care.  16.Hypertension.   TIME AT DISCHARGE:  41 minutes.   HOSPITAL COURSE:  Harold [name] is a 77 year old male with known  coronary artery disease.  He was having chest pain and  shortness of  breath and came to the hospital where he was admitted for further  evaluation.   His cardiac enzymes were negative for MI.  The heart catheterization  showed all grafts occluded except for the LIMA to LAD.  The SVG to RCA  was the only new occlusion.  An echocardiogram was performed, which  showed an EF of 55%, restricted physiology with decreased diastolic  components were also noted.  High ventricular filling pressures were  noted as well.  He was started on Lasix and diuresed with improvement in  his O2 saturation and his shortness of breath.   By Sep 08, 2008, his O2 saturation was 100% on room air.  He was feeling  better and ambulating without chest pain or shortness of breath.  His  blood pressure medications had been adjusted and Norvasc added for  better control.  Dr. Juanda Chance evaluated Harold [name] and considered him  stable for discharge with close outpatient followup.   DISCHARGE INSTRUCTIONS:  1. His activity levels to be increased gradually.  2. He is to call our office for problems with the cath site.  3. He is not to do any lifting for week.  4. He is to get a BMET on Friday at 10:30 a.m.  5. He is to follow up with Dr. Juanda Chance on June 3 at 2 p.m.  6. He is to follow up with Dr. Cato Mulligan as needed.   DISCHARGE MEDICATIONS:  1. Xanax 0.5 mg b.i.d. p.r.n.  2. Nitroglycerin sublingual p.r.n.  3. Wellbutrin SR 150 mg b.i.d.  4. Colchicine 0.6 mg daily p.r.n.  5. AndroGel daily.  6. Metoprolol is discontinued.  7. Benicar 20 mg a day.  8. Simvastatin 20 mg a day.  9. Imdur 30 mg a day.  10.Norvasc 5 mg a day.  11.Furosemide 40 mg a day.  12.Potassium 10 mEq a day.  13.Coreg 6.25 mg b.i.d.      Theodore Demark, PA-C      Bruce R. Juanda Chance, MD, [facility]  Electronically Signed    RB/MEDQ  D:  09/08/2008  T:  09/09/2008  Job:  [id number]   cc:   Valetta Mole. Swords, MD

## 2010-09-12 NOTE — Op Note (Signed)
Harold [name], Harold [name] NO.:  [id number]   MEDICAL RECORD NO.:  [id number]          PATIENT TYPE:  AMB   LOCATION:  [facility]                         [facility]ACILITY:  [facility]   PHYSICIAN:  Heloise Purpura, MD      DATE OF BIRTH:  1928-05-15   DATE OF PROCEDURE:  12/30/2008  DATE OF DISCHARGE:                               OPERATIVE REPORT   PREOPERATIVE DIAGNOSIS:  Bilateral hydroceles.   POSTOPERATIVE DIAGNOSIS:  Bilateral hydroceles.   PROCEDURE:  Bilateral hydrocelectomy.   SURGEON:  Heloise Purpura, M.D.   ANESTHESIA:  General.   COMPLICATIONS:  None.   ESTIMATED BLOOD LOSS:  Minimal.   INDICATIONS:  Harold [name] is a 77 year old gentleman with persistent  bothersome bilateral hydroceles.  These have been extremely large and  have affected his quality of life greatly.  After discussion regarding  management options for treatment, he did wish to proceed with surgical  correction.  I had a detailed discussion with the patient, especially  considering his cardiac history and the potential risks involved with an  anesthetic.  However, he did wish to proceed.  I did contact his  cardiologist, Dr. Charlies Constable, who felt that with preserved left  ventricular function that he would be at an acceptably low risk for a  minor outpatient surgical procedure.  I therefore discussed this finding  with Harold [name] and he did elect to proceed and gave informed  consent.   DESCRIPTION OF PROCEDURE:  The patient was taken to the operating room  and a general anesthetic was administered.  He was given preoperative  antibiotics, placed in the supine position, and prepped and draped in  the usual sterile fashion.  Next a preoperative time-out was performed.  A midline scrotal raphe incision was then made and brought over the left  scrotal dartos fascia.  This was taken down through the layers of the  dartos fascia, allowing the left testis to be delivered into the  operative field.   The tunica was then incised and the hydrocele fluid  was removed.  Approximately 200 mL of fluid was removed from this side.  There did not appear to be an excessive amount of hydrocele sac and  therefore no excision of the sac was performed.  The sac was brought  around the spermatic cord and sutured to itself with a running 3-0  chromic suture.  The testis was then placed back into its normal  anatomic position in the left hemiscrotum.  Hemostasis was ensured and  the dartos fascia was closed with a running 3-0 chromic suture.   Attention then turned to the contralateral hemiscrotum and through the  same skin incision an identical procedure was performed.  Approximately  150 mL of fluid was removed and the hydrocele was repaired in an  identical fashion.  The right testis was then placed back into the right  hemiscrotum and the dartos fascia was again closed with a running 3-0  chromic suture.  The skin was then reapproximated with a running 3-0  chromic suture.  A fluffed compressive dressing was applied.  The  patient appeared to  tolerate the procedure relatively well.  He did  become  bradycardic during the procedure, but maintained his blood pressure and  did not become hypotensive.  There were some concerns regarding EKG  changes and he is going to go to the recovery unit and have a 12-lead  EKG.  He was able to be extubated and transferred to the recovery unit  in satisfactory and stable condition.      Heloise Purpura, MD  Electronically Signed     LB/MEDQ  D:  12/30/2008  T:  12/30/2008  Job:  [id number]

## 2010-09-12 NOTE — Assessment & Plan Note (Signed)
[facility]                                 ON-CALL NOTE   NAME:Harold [name], Harold [name]                   MRN:          [id number]  DATE:10/26/2007                            DOB:          1929/02/23    DATE OF INTERACTION:  10/26/2007.   TIME OF INTERACTION:  At 2:37 p.m.   PHONE NUMBER:  [phone].   CALLER:  Nurse, adult.   SUBJECTIVE:  The patient is in a lot of pain, his blood pressure has  been high.  He has had high blood pressure for a long time, takes  Norvasc 5 and hydrochlorothiazide 25.  He was on Coreg in the past.  It  was taken off by Dr. Juanda Chance sometime ago.  He had Mohs surgery on Friday  [facility].  It was a long day, the  skin cancer was deep and now has significant swelling of the eyes as  well as pain in the area and that is probably what is making his blood  pressure go up.  The wife gave him an extra Norvasc yesterday evening.  Blood pressure has been up most of the weekend that did not do much and  he is taking only an occasional Tylenol for pain.   OBJECTIVE:  Pain secondary to Mohs surgery with elevated blood pressure.   PLAN:  The patient really needs to get his pain under control.  I would  suggest to take an extra Tylenol by doing Tylenol Extra Strength 2-4  times a day regularly for a while and then ice his eyes today to try and  get the swelling down.  Also, we would give him an extra  hydrochlorothiazide.  At this point, Norvasc will help immediately much,  and if the symptoms do not improve, then call to be seen tomorrow in the  clinic.  Primary care Ramon Zanders is Dr. Cato Mulligan and home office is  [place].     Arta Silence, MD  Electronically Signed    RNS/MedQ  DD: 10/26/2007  DT: 10/27/2007  Job #: [id number]

## 2010-09-12 NOTE — H&P (Signed)
Harold [name], Harold [name] NO.:  [id number]   MEDICAL RECORD NO.:  [id number]          PATIENT TYPE:  INP   LOCATION:  1827                         FACILITY:  [facility]   PHYSICIAN:  Rollene Rotunda, MD, FACCDATE OF BIRTH:  [birth year]   DATE OF ADMISSION:  09/04/2008  DATE OF DISCHARGE:                              HISTORY & PHYSICAL   PRIMARY CARE PHYSICIAN:  Dr. Cato Mulligan.   CARDIOLOGIST:  Dr. Juanda Chance.   REASON FOR PRESENTATION:  Evaluate the patient for chest pain and  shortness of breath.   HISTORY OF PRESENT ILLNESS:  The patient is a pleasant 77 year old  gentleman with a long history of coronary artery disease including  bypass and multiple percutaneous interventions.  He had been doing well  with no recent history of chest pain until yesterday.  He developed some  substernal and left upper chest discomfort.  This was a 7/10 discomfort  at its peak.  It was similar to previous angina.  It came on at rest.  It would last for 30-40 minutes.  He did have some radiation to his jaw.  He also had shortness of breath.  He was able to go to sleep last night.  However, he noticed it when he woke up again this morning.  He took a  nap and it went away.  It came back on after he tried the shower and so  he came to the emergency room.  He was not describing PND or orthopnea.  He is currently not having chest discomfort.  He is not having any  palpitations, presyncope or syncope.  He had otherwise been doing  relatively well recently.  He had not been having any fevers or chills.  He does have a chronic cough productive of some white sputum.  In the  emergency room, he was noted to have EKG with inferolateral T-wave  inversions.  He has right axis deviation which is new.  The T-wave  inversions are new compared with a very old EKG.  BNP is 1992.  D-dimer  is 1.66.  Point of care markers are negative.   The patient is very sedentary.  With his limited level of activity, he  has  not been bringing on any acute symptoms as described.   PAST MEDICAL HISTORY:  Coronary artery disease (CABG in 1990.  He has  had stenting of the saphenous vein graft anastomosis to the diagonal in  2002.  He had a Taxus stent to the saphenous vein graft diagonal,  apparently following this.  A cutting balloon angioplasty to diagonal  was low Taxus stent to the vein graft to the PDA in 2007.  The last  catheterization in 2008 demonstrated left main 80% stenosis, the LAD was  occluded, the circumflex was occluded, the right coronary artery was  occluded, saphenous vein graft to the PDA was patent with a 10%  restenosis at a stent site, and 40% stenosis in the PDA branch proximal  to the insertion of a vein graft, saphenous vein graft to diagonal  branch of the LAD was occluded in the midportion of the stent, LIMA to  the LAD was patent.  The  saphenous vein graft to the circumflex was  occluded.  The EF was well-preserved.), abdominal aortic aneurysm status  post repair, severe macular  degeneration, hyperlipidemia, sleep apnea,  on CPAP, depression, chronic renal insufficiency, renal adenoma,  nephrolithiasis, EtOH use, previous tobacco use, basal cell skin cancer.   PAST SURGICAL HISTORY:  CABG as described, abdominal aneurysm repair by  Dr. Arbie Cookey, basal cell skin cancer resected, back surgery.   ALLERGIES/INTOLERANCES:  None.   MEDICATIONS:  Alprazolam, Wellbutrin SR 150 mg b.i.d., colchicine 0.6 mg  daily, simvastatin 20 mg daily, AndroGel, amlodipine 5 mg daily,  hydrochlorothiazide 25 mg daily, testosterone, metoprolol 25 mg b.i.d.,  Benicar 20 mg daily, Aciphex 20 mg daily, hydrocodone, Tessalon Perles.   SOCIAL HISTORY:  The patient is married.  He was a cigarette smoker but  quit in 1990 with a 50 pack-year history.  He drinks about two drinks a  night currently.  He is retired.   FAMILY HISTORY:  Contributory for his father dying suddenly at age 47.   REVIEW OF SYSTEMS:   As stated in the HPI and negative for all other  systems.   PHYSICAL EXAMINATION:  The patient is in no distress.  Blood pressure 170/92, heart 79 and regular, respiratory rate 19,  afebrile.  HEENT:  Eyelids are normal, pupils equal and reactive, fundi not  visualized, oral mucosa normal.  NECK:  No jugular distention at 45 degrees, carotid upstroke brisk and  symmetric, no bruits, no thyromegaly.  LYMPHATICS:  No cervical, axillary or inguinal adenopathy.  LUNGS:  Decreased breath sounds without wheezing or crackles.  BACK:  No costovertebral tenderness.  CHEST:  Well-healed sternotomy scar.  HEART:  PMI not displaced or sustained, S1-S2 within normal limits, no  S3-S4, no clicks, no rubs, soft 2/6 apical early peaking systolic  murmur, no diastolic murmurs.  ABDOMEN:  Flat, well-healed surgical scars, positive bowel sounds,  normal in frequency and pitch, no bruits, rebound or guarding.  No  midline pulsatile mass, no hepatomegaly, no splenomegaly.  SKIN:  No rashes, no nodules.  EXTREMITIES:  2+ pulses throughout, left femoral bruit, mild bilateral  ankle edema, chronic venous stasis changes, well-healed saphenous vein  graft harvest sites.  NEURO:  Oriented to person, place, and time, cranial nerves II-XII  grossly intact, motor grossly intact.   EKG as above.   Labs: WBC 8.2, hemoglobin 16, platelets 135, sodium 139, potassium 3.7,  BUN 14, creatinine 0.33.   Chest x-ray:  Vascular congestion with small effusions.   ASSESSMENT/PLAN:  1. Chest discomfort:  The patient's chest discomfort sounds most like      angina.  However, with his D-dimer and the EKG changes and his very      sedentary lifestyle, I cannot exclude a pulmonary embolism.      Therefore, he needs a spiral CT.  If this is unremarkable, he will      need cardiac catheterization pending his creatinine.  He will be      ruled out for myocardial infarction.  I will use the beta blockers      that he is on,  aspirin, nitroglycerin paste, and heparin.  2. Heart failure:  The patient does have an elevated BNP with vascular      congestion on chest x-ray.  I am leery of giving him high dose      diuretics given the fact he has had a history of renal  insufficiency (I see the peak creatinine to be 1.6) and he is going      to get dye.  I will give 20 mg of Lasix tonight and watch his      creatinine closely.  He will get an echocardiogram.  We will      diurese him as his creatinine tolerates though again I will need to      be careful prior to catheterization if he goes on to have this.  3. Renal insufficiency as above.  4. Depression.  This seems to be an ongoing problem and may need to be      addressed further by Dr. Cato Mulligan.  5. Hypertension.  Will continue his previous medications and add the      nitroglycerin paste.  He will probably need to have this adjusted      as his blood pressure by his wife's report is high at home.  6. Other.  Will check a lipid profile and TSH.      Rollene Rotunda, MD, [facility]  Electronically Signed     JH/MEDQ  D:  09/04/2008  T:  09/04/2008  Job:  [id number]   cc:   Valetta Mole. Swords, MD

## 2010-09-14 NOTE — Assessment & Plan Note (Signed)
[facility]  NAME:  Harold [name], Harold [name] NO.:  [id number]  MEDICAL RECORD NO.:  [id number]      DATE OF BIRTH:  [birth year]  PHYSICIAN:  Wayland Denis, DO            VISIT DATE:                                  OFFICE VISIT   Harold [name] is a 77 year old white male who is here with a friend for reevaluation of scalp wound.  We have been using Oasis and it has shown very good improvement over the last several weeks.  He is alert, oriented, cooperative, and in no acute distress.  He is very pleasant. Oasis was replaced and we will have him follow up in a week for a nursing dressing change.  The wound has no signs of infection.  It is getting a little bit smaller and starting to epithelialize, but it looks very good and healthy.     Wayland Denis, DO     CS/MEDQ  D:  09/13/2010  T:  09/13/2010  Job:  [id number]

## 2010-09-15 NOTE — H&P (Signed)
NAME:  Harold [name], Harold [name] NO.:  [id number]   MEDICAL RECORD NO.:  [id number]            PATIENT TYPE:   LOCATION:                                 FACILITY:   PHYSICIAN:  Charlies Constable, M.D. [facility]      DATE OF BIRTH:   DATE OF ADMISSION:  DATE OF DISCHARGE:                                HISTORY & PHYSICAL   Patient seen in the [facility] clinic on May 23, 2005.   CHIEF COMPLAINT:  Chest pain.   HISTORY OF PRESENT ILLNESS:  This is a 77 year old married white male  patient of Dr. Charlies Constable, who has a history of coronary artery disease,  status post CABG x3 in 1990.  He then underwent stenting of an SVG to the  diagonal in October 2002 and had PCI for in-stent restenosis of the SVG to  the diagonal in May 2005 after undergoing abdominal aortic aneurysm repair.  At that time he had a 30% SVG to the PDA, total SVG to the circumflex,  patent LIMA to the LAD, with a 70-80 stenosis at anastomosis site.  All his  native vessels were totally occluded.  No LV gram was performed at his last  catheterization.  The patient had done well up until the past two weeks.  He  had   Dictation ended at this point.      Jacolyn Reedy, P.A. [facility]    ______________________________  Charlies Constable, M.D. [facility]    ML/MEDQ  D:  05/23/2005  T:  05/23/2005  Job:  [id number]

## 2010-09-15 NOTE — Op Note (Signed)
NAME:  Harold [name], Harold [name]                      ACCOUNT NO.:  [id number]   MEDICAL RECORD NO.:  [id number]                   PATIENT TYPE:  INP   LOCATION:  2313                                 FACILITY:  [facility]   PHYSICIAN:  Larina Earthly, M.D.                 DATE OF BIRTH:  December 02, 1928   DATE OF PROCEDURE:  07/06/2003  DATE OF DISCHARGE:                                 OPERATIVE REPORT   PREOPERATIVE DIAGNOSIS:  Abdominal and iliac artery aneurysm.   POSTOPERATIVE DIAGNOSIS:  Abdominal and iliac artery aneurysm.   PROCEDURE:  Aorto- to right common iliac and left common femoral artery  bypass with a 16 x 8 Hemashield graft.   SURGEON:  Larina Earthly, M.D.   </ASSISTANTS>  Janetta Hora. Darrick Penna, M.D., and Jerold Coombe, P.A.   ANESTHESIA:  General endotracheal.   COMPLICATIONS:  None.   DISPOSITION:  To recovery room stable.   PROCEDURE IN DETAIL:  The patient was taken to the operating room and placed  in the supine position, where the area of the abdomen and groin was prepped  and draped in the usual sterile fashion.  An incision was made from the  level of the xiphoid to below the umbilicus, carried down to the midline  fascia with electrocautery.  The peritoneum was entered.  The liver, large  and small bowel were normal.  The Omni-Track retractor was used for  exposure.  The transverse colon and omentum were reflected superiorly and  the small bowel was reflected to the right.  The duodenum was mobilized off  the aneurysm.  The aorta was encircled at the level of the left renal vein  and was of normal caliber in this position.  The aneurysm extended into the  iliac arteries with a large right common iliac artery aneurysm and a  moderate left iliac artery aneurysm.  The nerve erigentes crossing the left  iliac artery were left intact and the internal and external iliac arteries  were exposed bilaterally.  There was a moderate amount of arterial occlusive  disease at  this level.  The patient was given 8000 units of intravenous  heparin and 25 g of mannitol.  After adequate circulation time the aorta was  occluded below the level of the renal arteries at the level of the left  renal vein and the internal and external iliac arteries were occluded  bilaterally.  A 16 x 8 Hemashield graft was brought onto the field.  The  aorta was opened with electrocautery and mural thrombus was removed.  Lumbar  back bleeders were controlled with 2-0 silk pop-off stitches.  The IMA was  patent with excellent back bleeding, and this was ligated with 2-0 silk  ties.  The aortic graft was cut the appropriate length.  Using a felt strip  for reinforcement, it was sewn end-to-end to the aorta with a running 3-0  Prolene suture.  The  anastomosis was tested and found to be adequate.  Next  the limbs of the graft were brought down to the respective common iliac  arteries.  The left limb of the graft was tunneled below the level of the  nervi erigentes down to the bifurcation.  The left limb of the graft was cut  to appropriate length and was sewn end-to-end to the common iliac artery at  the junction of the internal and external iliac arteries on the left.  The  usual flushing maneuvers were undertaken.  The anastomosis was completed.  Next the right limb of the graft was cut to the appropriate length and again  was sewn end-to-end to the junction of the internal and external iliac  arteries.  After the usual flushing maneuvers, the anastomosis was completed  and flow was restored.  There was a good pulse in the right femoral artery.  There was diminished pulse in the left femoral artery.  The external iliac  artery was also interrogated and was felt to have palpable but diminished  flow.  There had been a moderate amount of plaque at the left iliac  bifurcation more so than the right, and there was a small internal iliac  artery on the left.  A decision was made to convert this  to a femoral  anastomosis.  A separate incision was made at the level of the inguinal  crease, carried down to isolate the common, superficial femoral, and  profunda femoris arteries.  The patient had a high bifurcation of the common  femoral artery.  A tunnel was created from the groin to the level of the  prior iliac anastomosis.  This was done taking care to pass behind the level  of the ureter.  The left limb of the graft was reoccluded and the iliac  arteries were reoccluded.  The common iliac artery anastomosis was exposed  and the graft was divided with a small cuff left on the common iliac artery.  This was then oversewn with 3-0 Prolene sutures.  A segment of 8 mm graft  was sewn end-to-end to the old left limb of the graft with a running 5-0  Prolene suture.  This was then tunneled down to the level of the femoral  artery.  The common, superficial femoral, and profunda femoris arteries were  occluded.  The common femoral artery was opened with an 11 blade, extended  longitudinally with Potts scissors.  The superficial femoral artery was  widely patent.  The graft was spatulated and sewn end-to-side to the  junction of the common and superficial femoral artery with a running 5-0  Prolene suture.  Prior to completion of the anastomosis the usual flushing  maneuvers were undertaken, the anasetomosis was completed, and excellent  flow was noted distally.  The patient was given 50 mg of protamine to  reverse the heparin.  The groin wound was closed with several layers of 2-0  Vicryl suture and the skin was closed with skin clips.  The small bowel was  run in its entirety, found to be without injury, and placed back in the  pelvis.  The transverse colon and omentum were placed over this.  Prior to  this the aneurysm walls were closed over the graft with a running 2-0 Vicryl  suture and the retroperitoneum was closed with a running 2-0 Vicryl suture. The midline fascia was closed with #1  PDS suture beginning proximally and  distally and tying in the middle.  The skin  was closed with skin clips,  sterile dressing was applied, and the patient was taken to the recovery room  intubated with palpable distal pulses.                                               Larina Earthly, M.D.    TFE/MEDQ  D:  07/06/2003  T:  07/06/2003  Job:  [id number]   cc:   Charlies Constable, M.D.   Bruce Rexene Edison Swords, M.D. [facility]

## 2010-09-15 NOTE — Discharge Summary (Signed)
[place]. [facility]  Patient:    Harold [name], Harold [name] Visit Number: [id number] MRN: [id number]          Service Type: CAT Location: [id number] [phone] Attending Physician:  Lenoria Farrier Dictated by:   Tereso Newcomer, P.A. Admit Date:  02/10/2001 Discharge Date: 02/11/2001   CC:         Windy Fast L. Ovidio Hanger, M.D., [address], [place], [place] [address]             Valetta Mole. Swords, M.D. [facility]   Discharge Summary  DATE OF BIRTH:  [birth year]  DISCHARGE DIAGNOSES: 1. Severe native three-vessel coronary artery disease. 2. Status post coronary artery bypass graft in 1990. 3. Stress Cardiolite on February 05, 2001 with moderate inferolateral wall    ischemia from apex to base, ejection fraction 57%. 4. Status post stent placement to saphenous vein graft to diagonal graft on    February 10, 2001 by Dr. Everardo Beals. [place]. 5. Unexplained right upper extremity swelling. 6. Incidental finding of a right adrenal nodule on chest CT to evaluate right    upper extremity swelling (most likely felt to be an adenoma by the    radiologist -- followup non-contrast CT of the abdomen suggested).    a. Liver cysts and gallstones also noted on chest CT.    b. Borderline lymph nodes in the chest also noted, felt to likely be       reactive. 7. Hypertension.  PROCEDURE PERFORMED THIS ADMISSION:  Percutaneous coronary intervention by Dr. Charlies Constable on February 10, 2001.  HOSPITAL COURSE:  This 77 year old male with a history of coronary disease, status post CABG in 1990 by Dr. Andrey Campanile with LIMA to LAD, SVG to PD/RCA and SVG to the OM and SVG to the diagonal, was seen in our office on February 06, 2001 for abnormal Cardiolite.  This was performed on February 05, 2001.  Images revealed inferolateral wall ischemia from apex to the base and EF of 57%, therefore he was referred for cardiac catheterization.  This was performed on February 07, 2001 and revealed left main  normal, LAD 100% after first septal, D-1 100% ostial, circumflex 100% mid (AVN), ______ 100% with septal collaterals, RCA dominant 100% after acute marginal, PDA with moderate diffuse disease, LIMA to LAD patent, SVG to ______ 100% ostial, SVG to RCA patent with mild anastomotic narrowing and SVG to diagonal patent with 99% stenosis and immediately after the anastomosis, EF 65%.  Therefore, it was felt by Dr. Rollene Rotunda that elective outpatient PCI of the SVG to diagonal should be performed.  The patient entered [facility] on February 10, 2001 for the procedure.  Dr. Juanda Chance performed the procedure with stenting to the SVG to diagonal.  The patient tolerated the procedure well and had no immediate complications.  On the morning of February 11, 2001, he was found to be in stable condition.  His right femoral artery site was okay, therefore it was felt he was stable enough for discharge home from a cardiac standpoint.  The patient had some right upper extremity swelling noted over the past month. He was evaluated with a venogram by Dr. Juanda Chance.  This was negative for any etiology for his right upper extremity swelling, therefore, he was sent for chest CT with contrast to rule out a chest process.  This was negative. Therefore, it was felt that the patient probably could undergo outpatient vascular consultation by Dr. Veneda Melter.  Incidentally, on chest CT, the radiologist did note borderline lymph nodes that were likely reactive, liver cysts, gallstones and a right adrenal nodule that he felt was most likely an adenoma.  The radiologist suggested followup non-contrast CT of the abdomen or an MRI to evaluate.  The patient was very anxious about this finding.  Apparently, he has had some cancer cells noted in the past by his urologist, therefore, he is requesting that the abdominal CT be performed prior to his discharge to home.  Therefore, this will be set up. After his abdominal  CT scan results are noted, he will be notified and sent home.  LABORATORY DATA:  WBC 7200, hemoglobin 14.3, hematocrit 41.4, platelet count 137,000.  Sodium 140, potassium 4, chloride 104, CO2 26, glucose 106, BUN 23, creatinine 1.5, calcium 9.  Post-procedure CK is 73, MB 1.9.  DISCHARGE MEDICATIONS: 1. Aspirin 325 mg q.d. 2. Plavix 75 mg q.d. x 1 month. 3. Hyzaar 50/12.5 mg q.d. 4. Celexa. 5. Celebrex. 6. Toprol-XL 25 mg q.d. 7. Nitroglycerin p.r.n. chest pain.  ACTIVITY:  No driving, heavy lifting, exertion or work for three days.  DIET:  Low fat, low sodium.  SPECIAL DISCHARGE INSTRUCTIONS:  He is to call our office for any groin swelling, bleeding or bruising.  FOLLOWUP:  He will see Dr. Juanda Chance on November 5th at 11 a.m.  As noted above, consideration will be made at that time for vascular consultation with Dr. Veneda Melter, given his negative chest CT results for etiology for his right upper extremity swelling.  Also, as noted above, he is pending abdominal CT without contrast to evaluate the right adrenal nodule noted on his chest CT today.  After that CT scan is finished and he knows the results, he will be ready for discharge to home. Dictated by:   Tereso Newcomer, P.A. Attending Physician:  Lenoria Farrier DD:  02/11/01 TD:  02/11/01 Job: [id number]

## 2010-09-15 NOTE — Op Note (Signed)
[place]. [facility]  Patient:    Harold [name], Harold [name]                   MRN: [id number] Proc. Date: 06/13/99 Adm. Date:  [id number] Attending:  Nelma Rothman Iii                           Operative Report  DIAGNOSIS:  Positive cytology, history of carcinoma in situ of the bladder.  OPERATIVE PROCEDURE:  Cystourethroscopy, bilateral retrograde ureteropyelograms, bilateral renal pelvic and bladder barbotage cytologies and bladder biopsies.  SURGEON:  Lucrezia Starch. Ovidio Hanger, M.D.  ANESTHESIA:  General laryngeal airway.  BLOOD LOSS:  Negligible.  TUBES:  None.  COMPLICATIONS:   None.  INDICATIONS:  [name] is a very nice 77 year old white male, who has a history of having carcinoma in situ of the bladder and has undergone treatment ith BCG.  He has had follow-up cystourethroscopies, however bladder cytologies have  been suspicious, although not diagnostic.  After understanding risks, benefits nd alternatives, he has elected to undergo the above procedure.  PROCEDURE IN DETAIL:  Patient was placed in supine position and after proper general laryngeal airway anesthesia, was placed in the dorsal lithotomy position. Prepped and draped with Betadine in a sterile fashion.  A cystourethroscopy was  performed with a 22.5-French Olympus panendoscope utilizing a 12 and 70 degree lens.  The bladder was carefully inspected.  There was mild trilobar hypertrophy, grade I trabeculation was noted and efflux of clear urine was noted bilaterally. There was some diffuse redness in the posterior wall, but it appeared to be inflammatory.  Under fluoroscopic guidance, bilateral bulb retrograde ureteropyelograms were performed and noted to be within normal limits and a 5-French whistle-tip catheter was placed up in each renal pelvis and barbotage cytologies were performed from the right and the left renal pelves and submitted to pathology with normal saline.   Bladder barbotage cytology then performed and submitted to pathology and barbotage was with saline and biopsy was performed of the posterior bladder wall in the area of the inflammation.  The base was cauterized with Bovie coagulation cautery and no other lesions were noted. Examination under anesthesia revealed a palpably normal prostate and no lesions  palpable.  All scopes were removed and the patient was taken to recovery room stable. DD:  06/13/99 TD:  06/13/99 Job: [id number]

## 2010-09-15 NOTE — Op Note (Signed)
   NAME:  Harold [name], HABIB                      ACCOUNT NO.:  [id number]   MEDICAL RECORD NO.:  [id number]                   PATIENT TYPE:  INP   LOCATION:  2853                                 FACILITY:  [facility]   PHYSICIAN:  Teena Irani. Odis Luster, M.D.               DATE OF BIRTH:  Aug 01, 1928   DATE OF PROCEDURE:  12/20/2001  DATE OF DISCHARGE:                                 OPERATIVE REPORT   PREOPERATIVE DIAGNOSES:  Complicated open wound of the right lower leg.   POSTOPERATIVE DIAGNOSES:  Complicated open wound of the right lower leg.   OPERATION PERFORMED:  1. Preparation of the recipient site.  2. Split thickness skin grafting to the right leg.  3. Application of a short leg splint.   SURGEON:  Teena Irani. Odis Luster, M.D.   ANESTHESIA:  General.   ESTIMATED BLOOD LOSS:  Minimal.   INDICATIONS FOR PROCEDURE:  The patient is a 77 year old man with a basal  cell carcinoma of the right leg.  It was very large.  This was excised and  margins were negative.  The wound has been prepared with dressings and he  now presents for split thickness skin grafting.  We discussed the nature of  the procedure and the risks and possible complications including the  possibility of graft loss, as well as scarring and wound healing problems  and the overall convalescence and wished to proceed.   DESCRIPTION OF PROCEDURE:  The patient was taken to the operating room and  placed supine.  After successful induction of general anesthesia, he was  prepped with Betadine and draped with sterile drapes.  The wound was  prepared by basically irrigating thoroughly with saline.  The graft was  harvested from the right thigh using the Zimmer dermatome on a 0.22 inch  setting.  The graft was measured 1.5:1.  Donor site dressed with scarlet red  and OpSite dressing.  The graft was applied to the wound bed secured with  skin staples and Adaptic and a VAC sponge placed over it.  He was placed in  a short leg splint.   The patient was taken to the recovery room in stable  condition having tolerated the procedure well.                                                Teena Irani. Odis Luster, M.D.    DMB/MEDQ  D:  12/20/2001  T:  12/23/2001  Job:  [id number]

## 2010-09-15 NOTE — Discharge Summary (Signed)
   NAME:  Harold [name], Harold [name]                      ACCOUNT NO.:  [id number]   MEDICAL RECORD NO.:  [id number]                   PATIENT TYPE:  INP   LOCATION:  5709                                 FACILITY:  [facility]   PHYSICIAN:  Teena Irani. Odis Luster, M.D.               DATE OF BIRTH:  [birth year]   DATE OF ADMISSION:  12/20/2001  DATE OF DISCHARGE:  12/26/2001                                 DISCHARGE SUMMARY   ADMISSION DIAGNOSIS:  Complicated open wound of the right lower leg.   PROCEDURE PERFORMED:  Preparation of site and split thickness skin grafting  with a short-leg splint on December 20, 2001.   SUMMARY OF HISTORY AND PHYSICAL:  This is a 77 year old man with a basal  cell carcinoma of the right lower leg that was excised several weeks ago.  Having confirmed negative margins, the wound was prepared with dressings,  and he presented to the hospital for a split thickness skin grafting.  For  further details of the history and physical, please see the chart.   HOSPITAL COURSE:  On admission he was taken to surgery, at which time the  skin graft was performed.  He tolerated this well.  Postoperatively he did  have a low-grade fever that responded to pulmonary hygiene.  The donor site  was exposed and looked very good.  On the fourth postoperative day the vac  dressing was removed and the graft appeared to be doing well.  Dressings  were started and these were changed every day.  The graft continues to do  well.  No evidence of infection, and it is felt that he is ready to be  discharged.   DISPOSITION:  He needs to keep his leg up as much as possible all the time.  A visiting nurse will change the dressing once a day.  He understands the  importance of leg elevation.   DISCHARGE MEDICATIONS:  1. Percocet #30, one p.o. q.6h.  p.r.n. pain.  2. Xanax 0.5 mg p.o. q.12h.  He knows that he should not take these drugs     together.  3. Other medications that he was taking before his  admission he will resume.   FOLLOW UP:  I will see him back in the office in two to three weeks for a  recheck.                                               Teena Irani. Odis Luster, M.D.    DMB/MEDQ  D:  12/26/2001  T:  12/29/2001  Job:  [id number]

## 2010-09-15 NOTE — Cardiovascular Report (Signed)
[place]. [facility]  Patient:    Harold [name], Harold [name] Visit Number: [id number] MRN: [id number]          Service Type: CAT Location: 3700 [phone] Attending Physician:  Lenoria Farrier Dictated by:   Everardo Beals Juanda Chance, M.D. [facility] Proc. Date: 02/10/01 Admit Date:  02/10/2001   CC:         Rollene Rotunda, M.D. [facility]  Bruce H. Swords, M.D. [facility]  Cardiopulmonary Lab   Cardiac Catheterization  CLINICAL HISTORY:  [name] is 77 years old and had bypass surgery I believe in 1990.  He recently developed symptoms of angina and was studied last week by Dr. Antoine Poche who found a tight stenosis at the anastomosis of the vein graft to the diagonal branch.  The vein graft to the circumflex artery was occluded, and the LIMA to the LAD and vein graft to the right coronary artery were patent.  I believe his left ventricular function was good.  Dr. Antoine Poche let him go home to return today for intervention on the tight lesion at the anastomosis of vein graft to the diagonal branch of the LAD.  The patient works as a Community education officer.  PROCEDURE:  Stent implantation.  CARDIOLOGISTEverardo Beals Juanda Chance, M.D. [facility]  PROCEDURE IN DETAIL:  The procedure was performed via the right femoral artery using a arterial sheath and 7-French AL1 guiding catheter.  A frontal arterial punch was performed, and Omnipaque contrast was used.  The patient was given weight-adjusted heparin that prolonged the ACT to greater than 200 seconds and was given double bolus Integrilin and infusion.  We were able to cross the lesion with the Luge wire without much difficulty.  We initially went in with a 2.5 x 20 mm Quantum Maverick and performed two inflations at 8 atmospheres for 38 seconds.  We then made a decision to deploy a stent, and we used a 2.5 x 12 mm Express and deployed this with two inflations of 10 and 16 atmospheres for 39 and 38 seconds.  The second inflation was performed with the  balloon inside the distal edge.  The stent did not appear to be quite completely expanded at the tightest point in the lesion, so we went back in with a 2.75 x 12 mm Quantum Maverick and performed one inflation at 17 atmospheres for 37 seconds.  Repeat diagnostic studies were the performed through the guiding catheter.  The patient tolerated the procedure well and left the laboratory in satisfactory condition.  RESULTS:  Initially the stenosis at the anastomosis was estimated at 90%. Following stenting, this improved to less than 10%.  CONCLUSION:  Successful stenting of the anastomotic lesion at the saphenous vein graft to the diagonal branch with improvement in percent narrowing from 90% to 10%.  DISPOSITION:  The patient was taken to postanesthesia for further observation. Dictated by:   Everardo Beals Juanda Chance, M.D. [facility] Attending Physician:  Lenoria Farrier DD:  02/10/01 TD:  02/10/01 Job: [id number]

## 2010-09-15 NOTE — Assessment & Plan Note (Signed)
[facility]                            CARDIOLOGY OFFICE NOTE   NAME:Harold [name], Harold [name]                   MRN:          [id number]  DATE:05/09/2006                            DOB:          01-29-1929    PRIMARY CARE PHYSICIAN:  Dr. Valetta Mole. Swords.   PAST MEDICAL HISTORY:  Harold [name] is 77 years old and had bypass  surgery in 1990 and has had multiple percutaneous coronary  interventions.  He had a stent placed at the anastomosis vein graft to a  diagonal branch and then had a second drug eluting stent placed in the  same position for instant restenosis.  He then had cutting balloon  angioplasty for recurrent focal instant restenosis.  His stent placed in  the ostium of the vein graft to the right coronary artery.  He has known  total occlusion of the vein graft to the circumflex artery.  In July of  2007 he had a taxis stent at the anastomosis of the LIMA graft to the  LAD.  The procedure was somewhat difficult and we had to use a  shepherd's crook guiding catheter to get good support and had to use a  buddy wire in order to get our balloons and stents down the vessel.   He has done well since that time but Saturday had an episode of chest  pain which woke him and night and for which he took nitroglycerin with  relief.  We had just recently done a followup Myoview scan which  suggested anterior ischemia, similar to what he had prior to his  intervention.  Comes in today for decision regarding further management.  He has had no recurrent chest pain since Sunday.  He is in the rehab  program, this has been temporarily interrupted by recent events.   PAST MEDICAL HISTORY:  1. Abdominal aortic aneurysm repaired by Dr. Earle.  2. Chronic renal insufficiency with a creatinine of 1.7.  3. Hypertension.  4. Hyperlipidemia.  5. Obstructive sleep apnea.   CURRENT MEDICATIONS:  Aspirin, alprazolam, Plavix, Wellbutrin, Micardis,  Toprol, and  Lasix.   EXAMINATION:  Blood pressure 174/83 and the pulse 67 and regular.  There  was no venous distention, the carotid pulses were full without bruits.  CHEST:  Clear without rales or rhonchi.  The cardiac rhythm was regular.  There were no murmurs or gallops.  ABDOMEN:  Soft but protuberant.  There was a midline surgical scar.  There was no hepatosplenomegaly.  There was trace 1+ peripheral edema.  Pedal pulses were difficult to  feel.   An electrocardiogram showed poor R wave progression, he is otherwise  normal.   IMPRESSION:  1. Recent chest pain and abnormal Myoview scan suggestive of      ischemia/possible restenosis at the recent PCI site.  2. Coronary artery disease.  Status post coronary bypass graft surgery      19 90, and status post multiple percutaneous interventions.  The      last of which was a taxis stent at the anastomosis of the LIMA      graft to the LAD.  3. Good left ventricular function.  4. Status post abdominal aortic aneurysm repair.  5. Chronic renal insufficiency with a creatinine 1.7.  6. Hypertension.  7. Obstructive sleep apnea.  8. Hyperlipidemia.   RECOMMENDATIONS:  The combination of Harold [name] symptoms and his  abnormal scan make me concerned that he has developed restenosis at his  PCI site.  Will plan to evaluate him in the outpatient laboratory next  week.  He will need a bicarb protocol because his creatinine is 1.7.  His blood pressure is elevated today and we will also add Norvasc 5 mg a  day for better blood pressure control.     Bruce Elvera Lennox Juanda Chance, MD, [facility]  Electronically Signed    BRB/MedQ  DD: 05/09/2006  DT: 05/09/2006  Job #: [id number]

## 2010-09-15 NOTE — Op Note (Signed)
   NAME:  Harold [name], Harold [name]                      ACCOUNT NO.:  [id number]   MEDICAL RECORD NO.:  [id number]                    PATIENT TYPE:   LOCATION:                                       FACILITY:   PHYSICIAN:  Teena Irani. Odis Luster, M.D.               DATE OF BIRTH:   DATE OF PROCEDURE:  12/04/2001  DATE OF DISCHARGE:                                 OPERATIVE REPORT   PREOPERATIVE DIAGNOSIS:  Basal cell carcinoma of right ankle greater than  2.0 cm.   POSTOPERATIVE DIAGNOSIS:  Basal cell carcinoma of right ankle greater than  2.0 cm.   PROCEDURE:  Excision of basal cell carcinoma of right ankle greater than 2.0  cm.   SURGEON:  Teena Irani. Odis Luster, M.D.   ANESTHESIA:  0.5% Marcaine plain.   CLINICAL NOTE:  A 77 year old man with a lesion just above his medial  malleolus on the right side which has been present for over a year and has  enlarged.  This has been biopsied by his family doctor and is proven to be  basal cell carcinoma.  He presents for a wide excision of this lesion.  He  will require reconstructive procedure, but negative margins need to be  confirmed.  The plan is to perform a skin graft once the wound is well-  prepared.  Initially, the excision and confirmation of the negative margins  is important.  I have explained that to him.  He understands that further  surgeries may be necessary to eradicate the tumor.  He wishes to proceed.   DESCRIPTION OF PROCEDURE:  The patient was taken to the operating room and  placed supine.  Using 0.5% Marcaine plain as a local after a Betadine prep,  the lesion was then marked taking a margin of grossly normal tissue around  the erythematous basal cell carcinoma.  The excision was performed with  meticulous electrocautery.  Dressing was with Xeroform gauze, saline-  moistened gauze, dry gauze, ABD and an Ace wrap.  He tolerated the procedure  well.  He is to keep the leg propped up for the next two days as much as  possible.   Darvocet N-100 a total of 30 given, 1 to 2 p.o. q.6h. p.r.n. for  pain.  Saline dressings three times a day to start tomorrow.  We will see  him back next week for recheck.                                               Teena Irani. Odis Luster, M.D.    DMB/MEDQ  D:  12/04/2001  T:  12/08/2001  Job:  [id number]

## 2010-09-15 NOTE — Discharge Summary (Signed)
NAME:  Harold [name], Harold [name]                      ACCOUNT NO.:  [id number]   MEDICAL RECORD NO.:  [id number]                   PATIENT TYPE:  INP   LOCATION:  2014                                 FACILITY:  [facility]   PHYSICIAN:  Larina Earthly, M.D.                 DATE OF BIRTH:  04/27/1929   DATE OF ADMISSION:  07/05/2003  DATE OF DISCHARGE:  07/09/2003                                 DISCHARGE SUMMARY   ADMISSION DIAGNOSES:  Abdominal aortic aneurysm.   PAST MEDICAL HISTORY:  1. Coronary artery disease.  2. Hypertension.  3. Remote tobacco abuse.  4. Hyperlipidemia.  5. History of obstructive sleep apnea, has reportedly used BiPAP at times.  6. Macular degeneration of the left eye.  7. Daily alcohol use.  8. History of depression.  9. Peripheral vascular disease.  10.      History of gallstones.  11.      History of renal stones.  12.      History of mild renal insufficiency with a creatinine in the range     of 1.7 to 2.  13.      Several liver cysts according to CT scan.  14.      Noted to have multiple prostate calculi.  15.      Right renal adenoma on CT scan.  16.      History of right knee fracture and has been told that he requires a     knee replacement in the future.   PAST SURGICAL HISTORY:  1. CABG in 1990 by Delsa Grana. Andrey Campanile, M.D.  2. PTA and stenting of his diagonal artery in 2002 by Charlies Constable, M.D.  3. History of kidney stone surgery.  4. History of back surgery.  5. History of skin cancer right leg basal cell carcinoma, status post split     thickness skin graft approximately one year ago by Etter Sjogren, M.D.  6. History of laser eye surgery.   ALLERGIES:  No known drug allergies.   DISCHARGE DIAGNOSES:  Abdominal aortic aneurysm, status post resection and  grafting of abdominal aortic aneurysm with a 16 x 8 Hemashield graft, aortic  to right common iliac and left common femoral artery bypass.   HISTORY OF PRESENT ILLNESS:  This is a 77 year old man  referred by Bruce H.  Swords, M.D. [facility] to Dr. Arbie Cookey for abdominal aortic aneurysm.  The patient  has undergone a recent urologic evaluation and he has a known history of  kidney stones.  The patient was found on ultrasound to have a large  abdominal aortic aneurysm.  A subsequent CT scan was obtained and this  revealed a 6.6 cm infrarenal abdominal aortic aneurysm.  The patient has  been asymptomatic.  The patient underwent a preoperative CT survey to  evaluate for possible stent graft usage, however, he is felt to not be a  candidate for this.  The patient underwent  a cardiology evaluation including  a Cardiolite and cardiac catheterization by Dr. Juanda Chance and was found to have  a lesion in his diagonal coronary artery, where there is presently a stent  that was placed in 2002.  This will require further attention in the future.  However, the patient has remained essentially asymptomatic.  It was felt  that his abdominal aortic aneurysm required attention at this time.  The  patient denied abdominal pain, nausea, vomiting, constipation, hematochezia,  back pain, hematemesis, dysuria, hematuria, as well as GERD symptoms.  The  patient does experience some claudication symptoms.  The patient  occasionally had peripheral edema primarily on the right side and relates  that to his previous knee injury.  The patient also complained of shortness  of breath, specifically dyspnea on exertion.  The patient was evaluated by  Dr. Arbie Cookey and he felt that the patient should be admitted for an elective  resection and grafting of his aneurysm.   HOSPITAL COURSE:  The patient was admitted and taken to the OR on July 05, 2003, for resection and grafting of abdominal and iliac artery aneurysm with  an aortic to right common iliac and left common femoral artery bypass with a  16 x 8 Hemashield graft.  The patient tolerated the procedure well and was  hemodynamically stable immediately postoperatively.  The  patient was  transferred from the OR to the postanesthesia care unit in stable condition.  The patient was extubated without complications and woke up from anesthesia  neurologically intact.  The patient was then transferred from the PACU to  the SICU in stable condition.  The patient's postoperative course was  progressed as expected.  The patient's creatinine was noted to be 2.0 on  postoperative day #1 and this has been followed closely since that time.  Again, the patient's creatinine was noted to be 2.4 on postoperative day #2  and the patient was otherwise in stable condition.  The patient was  evaluated by cardiac rehab on postoperative day #3.  The patient's blood  pressure was elevated in the right arm at 200/104 and the patient was  assisted to the chair.  The patient then ambulated with an unsteady stiff  gait due to his right knee pain.  Therefore, cardiac rehab suggested  physical therapy for strengthening.  Physical therapy evaluated the patient  on postoperative day #3 and suggested that a 5 inch fixed wheel attachment  will be given for the patient's sliding walker that he had previously used.  They also recommended home health PT after discharge.  This was ordered in  addition to home health nurse as requested by the patient and his wife.  On  postoperative day #4, the patient was without complaint.  He is having  flatus and bowel movements.  The patient is not experiencing any nausea or  vomiting.  He is afebrile and vital signs are stable.  The abdomen is soft,  nontender, and nondistended.  The staples were intact.  The extremities have  2+ dorsalis pedis pulses bilaterally.  The patient's diet will continue to  be advanced as he is tolerating mechanical soft at this point.  As long as  the patient continues to progress and is able to ambulate and tolerate a  regular diet, he is felt to be stable for discharge within the next one to  two days.  LABORATORY DATA:  CBC on  July 08, 2003; white count 8.8, hemoglobin 12.9,  hematocrit 37.2, platelets 119.  Chem-7 on July 08, 2003; sodium 134,  potassium 4.2, BUN 30, creatinine 2.0, glucose 124.   CONDITION ON DISCHARGE:  Improved.   DISCHARGE MEDICATIONS:  The patient is to resume his home medicines which  include:  1. Hyzaar 50/12.5 mg one p.o. daily.  2. Wellbutrin 150 mg one p.o. daily.  3. Ocuvite two tablets p.o. daily.  4. Stool softener Colace 100 mg two tablets p.o. daily particularly while     taking narcotics.  5. Tylox one to two p.o. q.4-6h p.r.n. pain.   ACTIVITY:  No driving, no strenuous activity, and the patient is to continue  daily breathing and walking exercises.   DIET:  Low salt, low fat, and low cholesterol.   WOUND CARE:  The patient may shower daily and clean the incision with soap  and water.  If the incision becomes red, swollen, or drains or the patient  has a fever over 101 degrees Fahrenheit, he is to call the [facility] Office.   FOLLOW UP:  The patient will need a staple removal appointment at [facility] and a  follow-up appointment with Dr. Arbie Cookey.  The [facility] Office will call the patient  with these appointments.      Pecola Leisure, [place]                      Larina Earthly, M.D.    AY/MEDQ  D:  07/09/2003  T:  07/12/2003  Job:  [id number]

## 2010-09-15 NOTE — Cardiovascular Report (Signed)
NAME:  Harold [name], Harold [name]                      ACCOUNT NO.:  [id number]   MEDICAL RECORD NO.:  [id number]                   PATIENT TYPE:  OIB   LOCATION:  6523                                 FACILITY:  [facility]   PHYSICIAN:  Charlies Constable, M.D. [facility]              DATE OF BIRTH:  Sep 22, 1928   DATE OF PROCEDURE:  09/07/2003  DATE OF DISCHARGE:                              CARDIAC CATHETERIZATION   CLINICAL HISTORY:  Ms. Scheff is 77 years old and has had previous bypass  surgery and previous percutaneous coronary interventions.  We studied him in  March and as preoperative evaluation prior to an aneurysm repair and found a  lesion in the anastomosis of a saphenous vein graft to a diagonal branch  within a previously placed stent. We elected not to treat the lesion because  this would defer aneurysm surgery and we felt the aneurysm was quite  critical.  He had the surgery for repair of his aneurysm and did not have  any cardiac complications and are bringing him today for intervention on the  lesion at the anastomosis of the saphenous vein graft to diagonal branch  within the stent.   PROCEDURE:  The procedure was performed via the right femoral artery using  arterial sheath and an AL2 6 [place] guiding catheter with side holes.  The  patient was given Angiomax bolus and infusion and had been on Plavix.  We  crossed the lesion at the anastomosis of the vein graft to the diagonal  branch of the LAD with the Luge wire without problems.  We predilated with a  2.5 x 10-mm cutting balloon performing two inflations up to 8 atmospheres  for 25 seconds.  We then deployed a 2.5 x 16-mm Taxus stent deploying this  with one inflation of 8 atmospheres for 30 seconds and a second inflation of  14 atmospheres for 30 seconds with the balloon inside the distal edge.  We  then dilated the proximal edge which we did not think was completely  opposed, since this was in the larger portion of the vein graft,  with a  3.0  x 8-mm Quantum Maverick performing one inflation up to 15 atmospheres for 30  seconds.  Repeat diagnostic studies were then performed through the guiding  catheter.  The patient tolerated the procedure well and left the laboratory  in satisfactory condition.  We used  Visipaque contrast because of the  patient's creatinine of 1.5 and made attempts to minimize the amount of  contrast used.   RESULTS:  Initially, the stenosis within the stent was estimated at 80%.  Following stenting, this improved to 0%.   CONCLUSION:  Successful PCI of the in-stent restenosis at the anastomosis of  the saphenous vein graft to the diagonal branch of the LAD using a Taxus  stent with improvement in stent diameter narrowing from 80% to 0%.   DISPOSITION:  The patient returned to the postangioplasty  unit for further  observation.  This vessel also supplied collateral to marginal to which the  graft had been previously occluded.                                               Charlies Constable, M.D. [facility]    BB/MEDQ  D:  09/07/2003  T:  09/08/2003  Job:  [id number]   cc:   Valetta Mole. Swords, M.D. [facility]

## 2010-09-15 NOTE — Discharge Summary (Signed)
Harold [name], Harold [name] NO.:  [id number]   MEDICAL RECORD NO.:  [id number]          PATIENT TYPE:  INP   LOCATION:  6531                         FACILITY:  [facility]   PHYSICIAN:  Charlies Constable, M.D. [facility] DATE OF BIRTH:  1928/12/16   DATE OF ADMISSION:  05/23/2005  DATE OF DISCHARGE:  05/25/2005                                 DISCHARGE SUMMARY   PRIMARY DIAGNOSES:  1.  Unstable anginal pain, status post percutaneous transluminal coronary      angioplasty and Taxus stent of the saphenous vein graft to posterior      descending artery with percutaneous transluminal coronary angioplasty to      the first diagonal this admission.  2.  Status post stent to the saphenous vein graft to diagonal in October      2002 with percutaneous intervention for in-stent re-stenosis in 2005.  3.  Status post aortocoronary bypass surgery with LIMA to LAD, SVG to PDA,      and SVG to diagonal, SVG to circumflex/OM total by previous      catheterization.  4.  Severe native three-vessel disease with the left anterior descending,      obtuse marginal, and right coronary artery all totaled,  distal left      anterior descending totaled, but medical therapy recommended.  5.  Status post abdominal aortic aneurysm and repair.  6.  Chronic renal insufficiency with a creatinine of 1.6.  7.  Hypertension.  8.  Obstructive sleep apnea.  9.  History of hyperlipidemia with total cholesterol of 162, triglycerides      174, HDL 30, and LDL 97 in May 5409.   PROCEDURES:  1.  Cardiac catheterization.  2.  Coronary arteriogram.  3.  PTCA and stent to one vessel with percutaneous coronary intervention to      a second vessel.   TIME AT DISCHARGE:  38 minutes.   HOSPITAL COURSE:  Mr. Harold [name] is a 77 year old male with known coronary  artery disease.  He was evaluated in the office on May 23, 2005, for  unstable anginal pain. He was evaluated and it was felt that cardiac  catheterization was  indicated, so he was admitted for hydration and further  evaluation.   Mr. Harold [name] had some chest pain that was relieved with sublingual  nitroglycerin. The catheterization showed a 90% stenosis in the vein graft  to the PDA which was treated with PTCA in the Taxus stent. The first  diagonal had a 95% stenosis at the distal end of a previously placed stent  and this was treated with percutaneous intervention. The stenosis was  reduced from 95% to 10%. The LIMA to LAD and SVG to diagonal were patent,  but distal to the LIMA insertion was a 70% to 80% stenosis in the LAD and  the distal LAD totaled with collateral circulation. The left ventriculogram  was performed.   Postprocedure, Mr. Harold [name] EKG was unchanged. The systolic blood  pressure was elevated, but his home medications were incorrectly ordered and  he is to restart those. He had bilateral femoral bruits upon exam, but no  hematoma  was noted. Mr. Harold [name] is pending evaluation by Dr. Juanda Chance, but  he is tentatively considered stable for discharge with outpatient follow-up  arranged.   DISCHARGE INSTRUCTIONS:  His activity level is increased gradually and he is  to call the office for problems with the cath site. He is to get a BMET next  week and follow up with Dr. Cato Mulligan as scheduled. He is to follow up with Dr.  Regino Schultze physician assistant or nurse practitioner on February 5th at 9:45  a.m.   DISCHARGE MEDICATIONS:  1.  Plavix 75 mg daily.  2.  Nitroglycerin sublingual p.r.n.  3.  Micardis 80 mg p.m. and 40 mg a.m.  4.  Wellbutrin 150 mg daily.  5.  Ocuvite two tabs daily.  6.  Crestor 10 mg daily.  7.  Hydrochlorothiazide 12.5 mg daily.  8.  Aspirin 325 mg daily.      Harold [name], P.A. [facility]    ______________________________  Charlies Constable, M.D. [facility]    RB/MEDQ  D:  05/25/2005  T:  05/25/2005  Job:  [id number]   cc:   Valetta Mole. Swords, M.D. [facility]  [address] [place]  [place] [address]   Charlies Constable, M.D. [facility]  [address]  [address]  [place]  [place] [address]

## 2010-09-15 NOTE — H&P (Signed)
NAME:  Harold [name], Harold [name]                      ACCOUNT NO.:  [id number]   MEDICAL RECORD NO.:  [id number]                   PATIENT TYPE:  INP   LOCATION:  3703                                 FACILITY:  [facility]   PHYSICIAN:  Marrian Salvage. Freida Busman, M.D. [facility]            DATE OF BIRTH:  11-14-1928   DATE OF ADMISSION:  07/18/2003  DATE OF DISCHARGE:  07/19/2003                                HISTORY & PHYSICAL   ADMITTING PHYSICIAN AND CARDIOLOGIST:  Charlies Constable, M.D. [facility]   PRIMARY CARE PHYSICIAN:  Valetta Mole. Swords, M.D. [facility]   VASCULAR SURGEON:  Larina Earthly, M.D.   CHIEF COMPLAINT:  Syncope.   HISTORY OF PRESENT ILLNESS:  Patient is a 77 year old male with a history of  coronary artery disease status post CABG x5 in 1990 by Dr. Andrey Campanile who was  relatively stable from a cardiovascular standpoint until 2002.  At that time  he had a cath in October 2002 due to chest pain and required percutaneous  stenting of the distal SVG anastamosis to the diagonal.  Again he was stable  until earlier this year when undergoing urologic evaluation with ultrasound  for kidney stones he was found to have a 6.6 cm infrarenal abdominal aortic  aneurysm.  This was asymptomatic at that time.  In preop evaluation a stress  test was reported as abnormal so he was referred for cardiac catheterization  on June 30, 2003.  There left main was normal, LAD, left circumflex, and RCA  were all totally occluded.  Saphenous vein graft to the PDA was patent with  30% proximal stenosis as well as 40% stenosis in the PDA, SVG to the D-2  showed a 90% stenosis in the anastamosis where the previous stent had been  and this diagonal provided collaterals to the left circumflex region, SVG to  the left circumflex was 100% occluded at its origin which was known to be  old, and the LIMA to LAD was tortuous with a 70-80% stenosis at the  insertion which was not felt to be amenable to percutaneous coronary  intervention.  His coronary  artery disease was asymptomatic so cardiology  and vascular surgery decided to proceed directly to the operating room for  AAA repair first and then PCI in roughly 3-4 months of the SVG to D-1  lesion.  He underwent the surgery and tolerated it well on July 05, 2003.  He was discharged home on July 12, 2003.  He was weak but slowly improving  and increasing his diet.  His recovery was relatively normal until the  evening of July 17, 2003 when he took one of his OxyContin tablets.  He had  the sensation immediately that it was stuck in his throat and then developed  severe 10/10 chest pain in the substernal region.  He went to the bedroom to  lie down and his chest pain became worse.  He got up to walk and  became  dizzy and fell.  He states he remembers falling and remembers the entire  event.  His wife was directly observing him and said that he was  unresponsive for a few seconds.  He came to but was unable to get up off the  floor.  She gave him two nitroglycerin without effect.  EMS arrived and  reported that he was hypotensive.  He was brought to the [facility]  Emergency Department and his chest pain slowly resolved over the next hour.  Within 1-1/2 hours he was completely painfree and back to baseline.  EKG and  cardiac markers were negative.  He was admitted to the cardiology service  for further evaluation.   PAST MEDICAL HISTORY:  1. Coronary artery disease as above.  Ejection fraction has been in the 50s.  2. Hypertension.  3. Hyperlipidemia.  4. Remote tobacco history.  5. AAA status post repair.  6. History of peripheral vascular disease.  7. Obstructive sleep apnea with BiPAP/CPAP use at home.  8. History of alcohol abuse.  9. Depression.  10.      Chronic renal insufficiency with a baseline creatinine of 1.7 to     2.0.  11.      Renal adenoma.  12.      Nephrolithiasis.   ALLERGIES:  No known drug allergies.   CURRENT MEDICATIONS:  1. Hyzaar 50/12.5 once  daily.  2. Wellbutrin 150 mg daily.  3. Ocuvite two tablets once a day.  4. Colace 100 mg b.i.d.  5. Tylox p.r.n. for pain.  6. Aspirin 325 mg daily.  7. Toprol XL unclear dose.   SOCIAL HISTORY:  The patient lives in [place] with his wife.  He has one  son.  He was formerly self-employed and worked [employer].  He quit  smoking in 1990.  He does have a history of alcohol use up to the present.  No drug abuse history.   FAMILY HISTORY:  Noncontributory.   REVIEW OF SYSTEMS:  Positive for general weakness, chest pain, anorexia,  otherwise negative in detail.   ADVANCED DIRECTIVES:  Patient is Full Code.   PHYSICAL EXAMINATION:  VITAL SIGNS:  Temperature 99.1, pulse 55, blood  pressure 111/60, respirations 18, and saturations 96% on room air.  GENERAL:  The patient was comfortable, conversant, in no apparent distress.  His oral mucosa was intact and normal.  NECK:  Supple with no thyromegaly, no carotid bruits, and a JVP that  appeared to be about 6 cm.  No significant lymphadenopathy.  HEART:  Regular rate and rhythm with S1 and S2, no murmurs or gallops.  LUNGS:  Clear to auscultation bilaterally but distant.  SKIN:  No rash.  ABDOMEN:  Soft, nontender.  He had a long midline abdominal scar as well as  a left groin scar, both of which had intact staples with no evidence of  swelling, erythema, discharge, or pain.  EXTREMITIES:  Trace edema with 1+ pulses in the PT and his feet were mildly  cool but hands were warm.  NEUROLOGICAL:  He was alert and oriented x3.   CHEST X-RAY:  Pending at the time of this dictation.   ELECTROCARDIOGRAM:  Rate of 57 in sinus bradycardia with normal axis, PR of  200, QRS of 100, and QTc of 440 with insignificant inferior Q waves and T  wave inversion in L only with less than 0.5 mm ST elevation in V1 and V2  which was unchanged from EKG July 08, 2003.  LABORATORIES:  White count of 13, hematocrit 42, platelets 340, sodium 134,  potassium  4.3, BUN 66, creatinine 2.9 up from 2.0, glucose 105, MB 1.3,  troponin less than 0.05, alcohol undetectable.   ASSESSMENT AND PLAN:  This is a 76 year old male with known coronary artery  disease but normal ejection fraction who is status post abdominal aortic  aneurysm repair 2 weeks ago.  He presents with syncope after eating a large  pill that he initially thought was stuck in his throat.  My hope would be  that this is a vagal episode induced by pain from the pill and/or vagal  stimulation from swallowing.  However, given his history it is also possible  this is arrhythmia.  It could be due to acute ischemia causing his chest  pain and ischemic ventricular tachycardia.  Less likely that he has  monomorphic ventricular tachycardia from prior scar given absence of  significant myocardial infarction in the past.  He will be admitted for  monitoring.  Ultimately Dr. Juanda Chance will have to decide if moving up his  percutaneous coronary intervention to the saphenous vein graft to diagonal  is indicated.  Given that he is now 2 weeks out from his surgery his  bleeding risk is significantly decreased.   Problem 1. SYNCOPE.  Vagal episode versus malignant arrhythmia.  Continue  telemetry.  Rule out myocardial infarction with serial EKG and markers.  Continue beta blocker and titrate up as possible.  Continue aspirin.  Currently no heparin or 2b3a given negative markers and his relatively  recent surgery increasing the risk of starting these medications.  Dr.  Juanda Chance to see on Monday regarding timing of PCI.   Problem 2. CORONARY ARTERY DISEASE.  As above.  Continue statin as well.   Problem 3. ACUTE RENAL FAILURE ON CHRONIC RENAL INSUFFICIENCY.  Likely ATN  secondary to his surgery.  He has no indications for dialysis with euvolemia  as well as normal potassium and BUN.  I have actually held his ARB short  term while his creatinine is elevated.  However, he has had significant  hypertension  and it would be important to control his blood pressure  acutely.  Additionally long term he would benefit from either ACE inhibitor  or ARB despite an elevated creatinine as long as his potassium and other  renal parameters can be controlled.  No evidence for decreased renal  perfusion due to AAA.  No indication for imaging at this time.   Problem 4. ABDOMINAL AORTIC ANEURYSM STATUS POST SURGERY.  Patient seems to  be doing well from this standpoint.  Dr. Arbie Cookey should follow up the patient  and the plan was to remove his staples on Monday, July 19, 2003.  Again  blood pressure control will be important.   Problem 5. OBSTRUCTIVE SLEEP APNEA.  Continue nocturnal CPAP.   Problem 6. HYPERTENSION.  As above beta blocker and hydrochlorothiazide with  addition back of ARB or ACE inhibitor pending improvement of his creatinine.                                                Marrian Salvage Freida Busman, M.D. [facility]    LAA/MEDQ  D:  07/18/2003  T:  07/20/2003  Job:  [id number]

## 2010-09-15 NOTE — Cardiovascular Report (Signed)
NAMESHYKEEM, [name] NO.:  [id number]   MEDICAL RECORD NO.:  [id number]          PATIENT TYPE:  INP   LOCATION:  2016                         FACILITY:  [facility]   PHYSICIAN:  Harold [name], M.D. [facility] DATE OF BIRTH:  11-13-1928   DATE OF PROCEDURE:  05/24/2005  DATE OF DISCHARGE:                              CARDIAC CATHETERIZATION   PROCEDURE:  Cardiac catheterization and percutaneous coronary intervention.   CLINICAL HISTORY:  Mr. Harold [name] is 77 years old and had previous bypass  surgery in 1990.  He has previously had a bare metal stent placed at the  anastomosis of the vein graft to the diagonal branch of the LAD and  subsequently had a Taxus stent for restenosis at the anastomotic site within  the diagonal branch of the LAD.  Over the last two weeks he has developed  symptoms of exertional chest pain with some chest pain at rest consistent  with unstable angina, and he was admitted yesterday through the office with  Harold [name], P.A., and myself, and scheduled for angiography today.   PROCEDURE:  The procedure was performed via the right femoral artery using  an arterial sheath and 6 French preformed coronary catheters.  A front wall  arterial puncture was performed and Omnipaque contrast was used.  A LIMA  catheter was used for injection of the LIMA graft.  An AL-2 was used for  injection of the vein graft to the diagonal branch of the LAD and a right  bypass graft catheter was used for injection of the vein graft to the right  coronary artery.  After completion of the diagnostic study, we made a  decision to do an intervention on the vein graft the posterior descending  branch of the right coronary and on the diagonal branch just past the  anastomosis of the vein graft.   The patient was given Angiomax bolus and infusion.  He was enrolled in the  CHAMPION trial and was given either Plavix or CHAMPION study drug.  We used  a 6 French right saphenous vein  bypass graft guiding catheter side holes and  a Prowater wire.  We elected not to use distal protection because the lesion  was ostial and we felt there would be quite a bit of movement in the filter  with trying to position the stent at the ostium, and we also felt the lesion  was fairly short and clean.  We crossed the lesion in the ostium of the vein  graft with the Prowater wire without difficulty.  We direct-stented with a  3.0 x 12 mm Taxus stent, performing two inflations up to 16 atmospheres for  30 seconds.  Repeat diagnostic exam performed through the guiding catheter.  We positioned the stent so that the proximal edge was just outside of the  ostium.  We then turned our attention to the diagonal branch of the LAD.   We used an AL-3 6 [place] guiding catheter without side holes.  We used a Pro-  PT moderate-support wire and crossed the lesion with the wire without too  much difficulty.  We used a  2.5 x 6 mm cutting balloon and performed four  inflations up to 10 atmospheres for 30 seconds.  Final diagnostic studies  were then performed through the guiding catheter.  The lesion was located in  the diagonal branch just at the distal edge of the previously-placed Taxus  drug-eluting stent.  The Taxus had been placed for in-stent restenosis for  an Express bare metal stent.   The patient tolerated the procedure well and left the laboratory in  satisfactory condition.  The right femoral artery was not suitable for  closure with Angio-Seal.   RESULTS:  1.  Left main coronary artery:  The left main coronary artery was free of      significant disease.  2.  Left anterior descending artery:  The left anterior descending gave rise      to a septal perforator and then was completely occluded.  3.  Circumflex artery:  The circumflex artery gave rise to a marginal branch      and then was completely occluded in its proximal portion.  4.  Right coronary artery:  The right coronary artery  gave rise to a conus      branch, a right ventricular branch, and then was completely occluded in      its midportion.  There was a 90% lesion also in the midportion before      the total occlusion.  5.  The saphenous vein graft to the posterior descending branch of the right      coronary artery was patent, but there was a 90% ostial stenosis.  There      was 50% narrowing in the posterior descending branch at the anastomosis.      The distal right coronary artery also filled three posterolateral      branches.  6.  The saphenous vein graft to the diagonal branch of the LAD was patent      and functioned well and the stent appeared patent, but right at the      distal edge of the stent within the diagonal branch there was a 95%      stenosis just before the diagonal bifurcated into two sub-branches.  7.  The LIMA graft to the LAD was patent and functioned normally, but there      was a 70-80% stenosis just after the anastomosis which had been present      on prior studies.  The LAD was also completely occluded near its apex      and the distal LAD filled via collaterals from the proximal LAD.  8.  The saphenous vein graft to the circumflex artery was completely      occluded.   No left ventriculogram was performed.   Following stenting of the saphenous vein graft to the posterior descending  branch of the right coronary artery, the stenosis improved from 90% to 0%.   Following cutting balloon angioplasty of the lesion in the diagonal branch  of the LAD at the distal edge of the stent, the stenosis improved from 95%  to 10%.   CONCLUSION:  1.  Coronary artery disease, status post coronary artery bypass graft      surgery in 1990 and prior percutaneous coronary interventions as      described above.  2.  Severe native vessel disease with total occlusion of the left anterior      descending artery, circumflex artery and right coronary artery. 3.  Ninety percent stenosis at the ostium  of the vein  graft to the posterior      descending branch of the right coronary artery, 95% stenosis in the      diagonal branch at the edge of the stent at the anastomotic site of the      vein graft to the diagonal branch, patent left internal mammary artery      graft to the left anterior descending artery with 70-80% stenosis at the      anastomosis to the left anterior descending artery, and total occlusion      of the vein graft to the circumflex artery (old).  4.  Normal left ventricular function by noninvasive studies.  5.  Successful stenting of the ostial lesion in the saphenous vein graft to      the posterior descending branch of the right coronary artery with      improvement in sentinel narrowing from 90% to 0% using a Taxus drug-      eluting stent.  6.  Successful percutaneous transluminal coronary angioplasty of the lesion      in the diagonal branch through the vein graft at the distal edge of the      stent with improvement of sentinel narrowing from 95% to 10%.   DISPOSITION:  The patient was returned to the post angiography unit for  further observation.  Will plan discharge tomorrow.  The patient is to  remain on long-term Plavix.           ______________________________  Harold [name], M.D. [facility]     BB/MEDQ  D:  05/24/2005  T:  05/25/2005  Job:  [id number]   cc:   Valetta Mole. Swords, M.D. [facility]  [address] [place]  [place] [address]   Cardiopulmonary Lab

## 2010-09-15 NOTE — Discharge Summary (Signed)
NAME:  Harold [name], Harold [name]                      ACCOUNT NO.:  [id number]   MEDICAL RECORD NO.:  [id number]                   PATIENT TYPE:  OIB   LOCATION:  6523                                 FACILITY:  [facility]   PHYSICIAN:  Charlies Constable, M.D. [facility]              DATE OF BIRTH:  [birth year]   DATE OF ADMISSION:  09/07/2003  DATE OF DISCHARGE:  09/08/2003                           DISCHARGE SUMMARY - REFERRING   DISCHARGE DIAGNOSES:  1. Coronary artery disease; history of coronary artery bypass grafting,     status post percutaneous intervention during this admission to the     saphenous vein graft to the diagonal utilizing a Taxus stent.  2. Renal insufficiency, baseline 1.5.  3. Obstructive sleep apnea.  4. Hypertension.   HOSPITAL COURSE:  [name] is a 77 year old male patient who has known  coronary artery disease and underwent bypass surgery in 1990.  In 2003 he  had stenting of the saphenous vein graft to the diagonal branch of the LAD.  A recent Cardiolite scan showed lateral ischemia.  Therefore he needed to  have a cardiac catheterization.  This was performed essentially because he  does have an abdominal aortic aneurysm and he needed to be surgically  cleared.  Even though he did have instant restenosis in the stent to the  marginal vein graft site, it was felt that he would be safe for surgery,  secondary to the large abdominal aortic aneurysm.  Dr. Arbie Cookey performed his  surgery and now the patient returns for a follow-up for this intervention.   He was admitted on Sep 07, 2003 for the intervention of the saphenous vein  graft to the diagonal.  A Taxus Express2 stent was placed for distal 80%  stenosis to a 0% post procedure stenosis.  The patient remained stable  overnight and was discharged home on Sep 08, 2003.   He was discharged to home on the following medications:  1. Plavix 75 mg a day.  2. Enteric coated aspirin 325 mg a day.  3. Toprol XL 50 mg a day.  4.  Sublingual nitroglycerin p.r.n. chest pain.  5. He is to resume his Wellbutrin, Colace and Ocu-Vite.  6. He may take Tylenol as needed for pain.   1. No heavy lifting over 10 pounds or straining for one week.  2. Low-fat diet.  3. Clean over catheterization site with soap and water; no scrubbing.  4. Call for questions or concerns at [phone].  5. He has a follow-up appointment on Sep 22, 2003 at 2:30 p.m. at [facility]     Cardiology with the Physician     Assistant for groin check and blood pressure check.  He does need a BMET     at that point to reassess his creatinine which is elevated at 1.5, which     is his baseline.  6. He has a follow-up hospital appointment with Dr. Juanda Chance on October 27, 2003  at 11:45 a.m.      [name], P.A. [facility]                      Charlies Constable, M.D. [facility]    LB/MEDQ  D:  09/08/2003  T:  09/08/2003  Job:  [id number]   cc:   Valetta Mole. Swords, M.D. [facility]   Charlies Constable, M.D. [facility]

## 2010-09-15 NOTE — H&P (Signed)
NAMEHAILE, BOSLER NO.:  [id number]   MEDICAL RECORD NO.:  [id number]          PATIENT TYPE:  OIB   LOCATION:  2899                         FACILITY:  [facility]   PHYSICIAN:  Charlies Constable, M.D. [facility] DATE OF BIRTH:  July 01, 1928   DATE OF ADMISSION:  11/13/2005  DATE OF DISCHARGE:                                HISTORY & PHYSICAL   PRIMARY CARDIOLOGIST:  Charlies Constable, M.D.   PRIMARY CARE PHYSICIAN:  Valetta Mole. Swords, M.D.   CHIEF COMPLAINT:  Chest pain.   HISTORY OF PRESENT ILLNESS:  Mr. Eves is a 77 year old male with a  history of coronary artery disease and multiple interventions as well as  bypass surgery.  He was seen in the office on 10/02/2005 and was scheduled  for routine adenosine Myoview.  The adenosine Myoview showed an EF of 59%  with moderate ischemia in the anterior and lateral walls at the mid  ventricular level and apex.  Additionally Mr. Siever reports exertional  chest pain; and has recently started taking nitroglycerin again.  He also  has concerns because he needs a knee replacement and his activity level has  been decreased recently because of the problems with his knee.  He has had  no resting symptoms and is currently symptom free.   PAST MEDICAL HISTORY:  1.  Status post aortocoronary bypass surgery x3 in 1990 with LIMA to LAD,      SVG to diagonal and SVG to PDA.  2.  Status post stent in the diagonal at the anastomosis of the SVG to      diagonal 2002.  3.  Status post Taxus stent for InStent restenosis in the SVG to diagonal.  4.  Status post cardiac catheterization January 2007 with cutting balloon      angioplasty in the diagonal at the distal edge of the stent which      improved a stenosis from 95% to 10% as well as Taxus stent to the SVG to      PDA improving that stenosis from 90% to 0.  5.  Preserved left ventricular function with an EF of 59% at the recent      Myoview.  6.  Hyperlipidemia.  7.  Abdominal aortic  aneurysm.  8.  History of syncope secondary to vasovagal reaction and dehydration.  9.  Hyperlipidemia.  10. Remote history of tobacco use.  11. Obstructive sleep apnea.  12. History of depression.  13. Chronic renal insufficiency.  14. History of renal adenoma.  15. History of nephrolithiasis.  16. Remote history of EtOH use.  17. History of macular degeneration in his left eye.  18. History of right knee fracture.   SURGICAL HISTORY:  1.  Status post aortocoronary bypass surgery.  2.  Multiple cardiac catheterizations.  3.  History of kidney stone surgery.  4.  History of back surgery.  5.  History of basal cell carcinoma removal from his right leg with skin      grafting.  6.  History of laser eye surgery.   ALLERGIES:  No known drug allergies.   CURRENT MEDICATIONS:  1.  Ocuvite b.i.d.  2.  Aspirin 325 mg a day.  3.  Xanax 0.5 mg q.h.s.  4.  Plavix 75 mg a day.  5.  Wellbutrin SR 150 mg b.i.d.  6.  Fish oil daily.  7.  Micardis 40 mg a day a.m. and 80 mg p.m.  8.  Crestor 10 mg a day.  9.  Cardizem LA 180 mg p.o. b.i.d. (He had stopped Crestor at one time      because it gave him the shakes).   SOCIAL HISTORY:  He lives in [place] with his wife.  He used to smoke  heavily, but quit in 1990 with approximately a 50 pack-year history.  He  used to drink 2-3 drinks a night, but has cut back.  He does not abuse  drugs.  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   FAMILY HISTORY:  His mother died at age 32 from a CVA.  His father died at  age 22; he was found dead.  He was believed to have coronary artery disease.  Mr. Grine is an only child. All of the uncles on his father's side have  coronary artery disease as well.   REVIEW OF SYSTEMS:  Significant for increased dyspnea on exertion.  He also  reports increased lower extremity edema and hand edema.  He denies any  recent fevers or chills.  There is no hematemesis, hemoptysis, or melena.  He is  not ambulating that much because of knee pain.  Review of systems is  otherwise negative.   PHYSICAL EXAM:  VITAL SIGNS:  Temperature is 97.7, blood pressure 184/97,  heart rate 78, respiratory rate 20, O2 saturation 97% on room air.  GENERAL:  He is a well-developed, well-nourished white male in no acute  distress.  HEENT:  His head is normocephalic and atraumatic with pupils equal, round,  and reactive to light accommodation.  Extraocular movements intact.  Sclerae  clear.  Nares without discharge.  NECK:  There is no lymphadenopathy, thyromegaly, bruit or JVD noted.  CARDIOVASCULAR:  His heart is regular in rate and rhythm with an S1-S2 and  no significant murmur, rub, or gallop is noted.  ABDOMEN:  Soft and  nontender with active bowel sounds.  SKIN:  No rashes or lesions are noted.  Surgical scars are well-healed.  EXTREMITIES:  There is no cyanosis, clubbing or edema.  Distal pulses are  intact in all 4 extremities; and no femoral bruits are appreciated.  MUSCULOSKELETAL:  There are no joint deformity or effusions and no spine or  CVA tenderness is noted.  NEUROLOGIC:  He is alert and oriented.  Cranial nerves II-XII grossly  intact.   LABORATORY VALUES:  Hemoglobin 16.7, hematocrit 50.9, WBC 6.2, platelets  161.  Sodium 139, potassium 4.8, chloride 100, CO2 31, BUN 27, creatinine  1.7, glucose 104.   IMPRESSION:  1.  Crescendo anginal pain:  Mr. Bushart is here today for cardiac      catheterization with further intervention and treatment based on the      results.  Mr. Camper will otherwise be continued on his home      medications and be hydrated prior to cardiac catheterization for      elevated BUN and creatinine.      Theodore Demark, P.A. [facility]    ______________________________  Charlies Constable, M.D. [facility]    RB/MEDQ  D:  11/13/2005  T:  11/13/2005  Job:  [id number]

## 2010-09-15 NOTE — Assessment & Plan Note (Signed)
[facility]                              CARDIOLOGY OFFICE NOTE   NAME:Harold [name], Harold [name]                   MRN:          [id number]  DATE:12/24/2005                            DOB:          12-29-1928    PRIMARY CARE PHYSICIAN:  Valetta Mole. Swords, MD   CLINICAL HISTORY:  Harold [name] is 77 years old and had bypass surgery in  1990.  He returns now after his recent intervention.  He originally had a  bare metal stent placed at the anastomosis of he vein graft to the diagonal  branch and subsequently had a drug eluting stent placed a the anastomosis of  the vein graft to a diagonal branch and LAD for instant restenosis, and then  he had cutting with angioplasty for that lesion.  He also had a stent placed  in the __________vein graft of the right coronary artery about seven months  ago.  He recently was studied for increasing symptoms and abnormal Myoview  scan and was found to have occlusion of the stent to the diagonal branch and  progression of disease in the LAD at the anastomosis of the LIMA graft to  the LAD.  His stent in the vein graft to the right remained patent.  He  underwent placement of a new Taxus stent in the LAD just after the  anastomosis of the LIMA graft.   He has done fairly well since that time.  He has had no recurrent chest  pain, shortness of breath or palpitations.  He is scheduled to enter the  rehab program next week.   PAST MEDICAL HISTORY:  1. Significant for abdominal aortic aneurysm repair.  2. Chronic renal insufficiency.  3. Hypertension.  4. Hyperlipidemia.  5. Obstructive sleep apnea.   CURRENT MEDICATIONS:  1. Ocuvite.  2. Aspirin.  3. Alprazolam.  4. Plavix.  5. Wellbutrin.  6. Micardis.  7. Vytorin.   PHYSICAL EXAMINATION:  VITAL SIGNS:  Blood pressure 138/80, pulse 70 and  regular.  There was no venous distension.  Carotid pulses were full without  bruits.  CHEST:  Full.  CARDIAC:  Rhythm was  regular.  I could see no murmurs, rubs or gallops.  ABDOMEN:  Soft without organomegaly.  Peripheral pulses were somewhat  decreased, and there was trace to full peripheral edema.   IMPRESSION:  1. Coronary artery disease status post coronary artery bypass graft      surgery in 1990 and status post multiple percutaneous interventions,      the last of which was a Taxus stent in the LAD at the anastomosis of      the LIMA graft to the LAD, now stable.  2. Good LV function.  3. Status post abdominal aortic aneurysm repair.  4. Chronic renal insufficiency.  5. Obstructive sleep apnea.  6. Hypertension.  7. Hyperlipidemia.   RECOMMENDATIONS:  I think Harold [name] is doing well.  He is very  deconditioned.  I think the rehabilitation program will be very important  for him.  His recent lipid profile showed a low HDL and an LDL that was not  at target, and I was considering changing his lipid drugs, but he is  currently in the doughnut hole for Medicare, so I will wait until he gets  out of the doughnut hole.  I plan to see him back in follow up in three  months.                               Bruce Elvera Harold [name] Juanda Chance, MD, [facility]    BRB/MedQ  DD:  12/24/2005  DT:  12/25/2005  Job #:  [id number]

## 2010-09-15 NOTE — Assessment & Plan Note (Signed)
[facility]                            CARDIOLOGY OFFICE NOTE   NAME:Lemire, BENARD MINTURN                   MRN:          [id number]  DATE:06/03/2006                            DOB:          March 20, 1929    June 03, 2006   PRIMARY CARE PHYSICIAN:  Valetta Mole. Swords, MD.   CLINICAL HISTORY:  Mr. Kincy returned for a follow-up visit after  his recent catheterization.  He has had bypass surgery in 1990 and has  had multiple percutaneous interventions, the last of which was stenting  of the LAD at the anastomosis of the LIMA graft with LAD.  He had a scan  which suggested possible recurrent ischemia, but at catheterization his  stent was widely patent.  He had occlusion of his vein graft to the  diagonal branch of the LAD at a stent site and has occlusion of the  circumflex artery on the back side of his heart, both of which fill by  collaterals.  Vein graft to the right artery is working well.  We felt  continued medical therapy was indicated.  He has done fine since his  catheterization and has had no chest pain, shortness of breath or  palpitations.  He is scheduled to re-enter the rehab program tomorrow.   PAST MEDICAL HISTORY:  1. Abdominal aortic aneurysm repair by Dr. Arbie Cookey.  2. He has chronic renal insufficiency with a creatinine of 1.7 to 1.8.      His creatinine two days after his cath was stable at 1.8.  3. Hypertension.  4. Hyperlipidemia.  5. Obstructive sleep apnea.   CURRENT MEDICATIONS:  Aspirin, Plavix, Wellbutrin, alprazolam, Micardis.   He is not on a Statin and he is not sure why.  He thinks he was  intolerant to Crestor and Vytorin.  Also according to my cath note, we  were to increase his Norvasc from 5 to 10 a day to help with his blood  pressure but he does not have that listed in his medications as well.  I  will call his wife and get some clarification of these medications.  Ideally, he should be on Norvasc 10 mg and we  should try and get him on  a Statin, possibly Simvastatin 40 daily.  I will see him back in six  months.     Bruce Elvera Lennox Juanda Chance, MD, [facility]  Electronically Signed    BRB/MedQ  DD: 06/03/2006  DT: 06/03/2006  Job #: [id number]

## 2010-09-15 NOTE — Discharge Summary (Signed)
NAMESANJEEV, MAIN NO.:  [id number]   MEDICAL RECORD NO.:  [id number]          PATIENT TYPE:  OIB   LOCATION:  6523                         FACILITY:  [facility]   PHYSICIAN:  Joellyn Rued, P.A. [facility] DATE OF BIRTH:  1928/08/29   DATE OF ADMISSION:  11/13/2005  DATE OF DISCHARGE:                           DISCHARGE SUMMARY - REFERRING   BRIEF HISTORY:  Mr. Trouten is a 77 year old male who was seen in the  office on 10/02/2005 by Dr. Juanda Chance.  An adenosine Myoview was arranged given  it was  6 months since his last intervention.  This scan showed EF of 59%  moderate ischemia in the anterolateral wall at the mid ventricle level and  apex.  It was felt he should be admitted for further evaluation and  catheterization.  Since his initial evaluation Lavella Hammock reported that  he has had increased dyspnea on exertion and lower extremity as well as hand  edema.   Past medical history is notable for bypass surgery in 1990 with a stent  placed in the diagonal at the saphenous vein graft anastomosis, AAA repair  in 2005, Taxus stent saphenous vein graft to the diagonal branch at the  prior stent site, Taxus stenting the ostial saphenous vein graft to PDA,  cutting balloon angioplasty of in-stent restenosis of the anastomosis of the  saphenous vein graft to the diagonal.  History is also notable for chronic  renal insufficiency, hypertension, hyperlipidemia, obstructive sleep apnea,  remote tobacco use.   LABORATORY:  On admission H&H of 16.7 at 50.9, normal indices, platelets of  161, PTT 29, PT 14.5, sodium 139, potassium 4.8, BUN 27, creatinine 1.7.  Postprocedure H&H remained unchanged, BUN was 25, creatinine 1.7, glucose  107, potassium 4.6.  Post procedure CK-MB was unremarkable.  EKGs pre and  post procedure showed sinus rhythm, first-degree AV block, normal axis,  nonspecific ST-T wave changes.   HOSPITAL COURSE:  Mr. Adachi was brought into the cardiac  catheterization  lab for further evaluation.  Catheterization was performed on 11/13/2005 by  Dr. Juanda Chance.  Native three-vessel coronary artery disease LIMA to the LAD was  patent; however, he had a distal 90% LAD lesion past the anastomosis, 60%  proximal saphenous vein graft lesion to the diagonal 100% closed at the  anastomosis, less than 10% lesion at the ostium of the saphenous vein graft  to the PDA with a distal 50% lesion. LV gram was not performed.  Dr. Juanda Chance  performed stenting to the native LAD lesion reducing this from 90% to 10%.  Pharmacy made a note that consider decreasing Micardis to 80 mg per day as  that blood pressure if remained high, considering adding another agent,  because after doses of 80 mg, there is no added benefit.  Respiratory care  became involved because the patient refused to wear his CPAP.  Cardiac rehab  also assisted with ambulation and education.  By  7/18 he was ambulating  without difficulty.  Dr. Juanda Chance did notice a bruit at the right  catheterization site that had been present.  He felt that he could be  discharged  home.  He added Vytorin to his medical regimen and resumed  Cardizem.  He recommended long-term Plavix cardiac rehab and outpatient  follow-up.   DISCHARGE DIAGNOSES:  1.  ACS.  2.  Progressive coronary artery disease status post stenting to the native      LAD, hyperlipidemia, intolerance to Crestor, chronic renal      insufficiency.  History is noted below.   PROCEDURES PERFORMED:  Cardiac catheterization with stenting to the native  LAD by Dr. Charlies Constable on 11/13/2005   DISPOSITION:  Mr. Duffus is discharged home, asked to maintain low salt,  fat, cholesterol diet.  Activity and wound care per supplemental sheet.  He  is asked to bring all medications to all appointments.  He received a new  prescription for nitroglycerin 0.4 as needed.  He was asked to continue his  home medications which include Vytorin 10/40 q.h.s.,  Cardizem CD 180 q.d.,  OcuVite q.d., aspirin 325 daily,  Plavix 75 daily, Alprazolam 0.5 q.h.s.,  Wellbutrin 150 b.i.d., Micardis HCT 40/12.5 q.d., Micardis 80 q.p.m.  He  will follow up with Dr. Regino Schultze  PA on 11/27/2005 at 10:15 as Dr. Juanda Chance  does not have any appointments available until late August.  He will need  blood work in approximately 6 to 8 weeks in regards to FLP and LFTs since x  Vytorin was initiated.  Discharge time less than 30 minutes.      Joellyn Rued, P.A. [facility]     EW/MEDQ  D:  11/14/2005  T:  11/14/2005  Job:  [id number]   cc:   Valetta Mole. Swords, M.D. [facility]  [address] [place]  [place] [address]

## 2010-09-15 NOTE — Cardiovascular Report (Signed)
[facility]  Patient:    Harold [name], Harold [name] Visit Number: [id number] MRN: [id number]          Service Type: CAT Location: 3700 [id number] Attending Physician:  Lenoria Farrier Dictated by:   Rollene Rotunda, M.D. [facility] Proc. Date: 02/07/01 Admit Date:  02/10/2001 Discharge Date: 02/11/2001   CC:         Bruce H. Swords, M.D. [facility]   Cardiac Catheterization  DATE OF BIRTH:  18-Sep-1928  PRIMARY PHYSICIAN:  Valetta Mole. Swords, M.D.  NAME OF PROCEDURES: 1. Left heart catheterization. 2. Coronary arteriography.  CARDIOLOGIST:  Rollene Rotunda, M.D.  INDICATIONS:  Evaluate patient with known coronary disease, status post CABG and ongoing shortness of breath.  PROCEDURE:  Left heart catheterization was performed via the left femoral artery.  The artery was cannulated using anterior wall puncture.  A #6 French arterial sheath was inserted via the modified Seldinger technique.  Preformed Judkins and pigtail catheters were utilized.  The patient tolerated the procedure well and left the lab in stable condition.  RESULTS: CORONARIES:  His left main was normal.  The LAD was occluded after the first septal perforator.  First diagonal was occluded at the ostium.  Circumflex was occluded in the mid AV groove.  The mid obtuse marginal was occluded and filled with septal collaterals.  The right coronary artery was dominant.  It was occluded after an acute marginal.  A PDA had moderate diffuse disease.  GRAFTS:  The LIMA to the LAD was patent.  The saphenous vein graft to the mid obtuse marginal was occluded at the ostium.  Saphenous vein graft to the right coronary artery was patent with mild anastomotic narrowing.  An SVG diagonal was patent with 99% stenosis immediately after the anastomosis.  The EF was 65%.  CONCLUSIONS: 1. Native three-vessel coronary artery disease. 2. Occluded saphenous vein graft to the obtuse marginal. 3.  High-grade lesion of the saphenous vein graft to the diagonal.  PLAN:  The patient will have elective outpatient PTCI of the saphenous vein graft to the diagonal. Dictated by:   Rollene Rotunda, M.D. [facility] Attending Physician:  Lenoria Farrier DD:  05/05/01 TD:  05/05/01 Job: [id number]

## 2010-09-15 NOTE — Procedures (Signed)
[facility]  Patient:    Harold [name], Harold [name]                   MRN: [id number] Proc. Date: 06/24/00 Adm. Date:  [id number] Attending:  Rich Brave CC:         Lilly Cove, M.D.   Procedure Report  PROCEDURE:  Colonoscopy with polypectomy.  INDICATION:  A 77 year old with prior history of adenomatous colonic polyp removed four years ago.  FINDINGS:  Two semipedunculated polyps removed from the ascending colon, and diminutive polyp removed by cold biopsy.  DESCRIPTION OF PROCEDURE:  The nature, purpose, and risks of the procedure were familiar to the patient from prior evaluation, and he provided written consent.  Sedation with fentanyl 40 mcg and Versed 4 mg IV without arrhythmias or desaturation.  The Olympus adjustable-tension pediatric video colonoscope was quite easily advanced to the cecum, as identified by the absence of further lumen, and pullback was then performed.  The quality of the prep was excellent, and it is felt that all areas were well seen.  In the ascending colon, in a cleft, I encountered a 1 cm semipedunculated polyp and adjacent to it, a diminutive 3 mm sessile polyp.  The sessile polyp was removed by a single cold biopsy.  The base of the larger polyp was injected with 3.5 cc of epinephrine, resulting in a nice bleb formation to elevate the base of the polyp, and polypectomy was accomplished with the snare using settings of 20 and 20, with complete hemostasis and no evidence of excessive cautery.  The polyp was ultimately retrieved by withdrawing it through the scope out the anus, although it fell off a couple of times and we had to search back for it and go back and forth through the colon and so forth.  A short distance distal to the polyp just mentioned was another, somewhat smaller, roughly 8 mm semipedunculated polyp, the base of which was also injected with epinephrine, and the polyp was removed by  snare technique.  It came with complete hemostasis and no evidence of excessive cautery.  That polyp also had to be withdrawn out through the anus with the scope.  No other polyps were observed, and there was no evidence of cancer, colitis, vascular malformations, or diverticulosis.  Retroflexion in the rectum was normal.  The patient tolerated the procedure well (had transient bradycardia down into the 40s, but this appeared to be asymptomatic and occurred at a time when he was having some abdominal discomfort, probably from the epinephrine and/or the presence of gas in the colon).  There were no apparent complications.  IMPRESSION:  Two medium-sized polyps and one diminutive polyp, removed as described above.  PLAN:  Await pathology. DD:  06/24/00 TD:  06/24/00 Job: [id number]

## 2010-09-15 NOTE — Assessment & Plan Note (Signed)
[facility]                              CARDIOLOGY OFFICE NOTE   NAME:Harold [name], Harold [name]                   MRN:          [id number]  DATE:01/30/2006                            DOB:          Jun 27, 1928    PRIMARY CARE PHYSICIAN:  Valetta Mole. Swords, MD   MEDICAL HISTORY:  Mr. Harold [name] is 77 year old and returned for an  unscheduled visit because of increased edema in his lower extremities with  some pain with walking.  He had these symptoms in rehab and they advised him  to come in for further evaluation.  He was concerned that this might be from  his heart.  He says he has some pain in his legs but he is not certain that  it is totally related to exertion.  He thinks the swelling has been worse  over the last several weeks.  He has had change in his shortness of breath  and no chest pain.   He had bypass surgery in 1990 and has had multiple percutaneous  interventions.  The last of these was a stent at the anastomosis of the LAD  just after the anastomosis of the LIMA graft.  This was done in July of  2007.   PAST MEDICAL HISTORY:  Significant for a previous abdominal aortic aneurysm  repair performed by Dr. Arbie Cookey.  He also has chronic renal insufficiency with  a creatinine of 1.7.  He has hypertension and hyperlipidemia and obstructive  sleep apnea.   CURRENT MEDICATIONS:  Ocuvite, aspirin, Alprazolam, Plavix, Wellbutrin,  Micardis and Vytorin.   EXAMINATION:  VITAL SIGNS:  The blood pressure is 150/70 and the pulse 70  and regular.  NECK:  There was no venous distention.  Carotid pulses were full without  bruits.  CHEST:  Clear without rales or rhonchi.  HEART:  The cardiac rhythm was regular.  The heart sounds were normal and no  murmurs or gallops.  ABDOMEN:  Protuberant.  The bowel sounds were normal and the abdomen was  soft.  There was no hepatosplenomegaly.  EXTREMITIES:  There was 1 to 2+ edema in the lower extremities.  The  pulses  were somewhat decreased in both feet.   An electrocardiogram was normal.   IMPRESSION:  1. Lower extremity edema probably related to venous insufficiency, doubt      congestive heart failure.  2. Coronary artery disease, status post coronary artery bypass graft      surgery in 1990, status post multiple percutaneous interventions, last      of which was a drug eluting stent in the LAD just after the insertion      of a LIMA 7/07.  3. Good left ventricular systolic function.  4. Status post abdominal aortic aneurysm repair.  5. Chronic renal insufficiency.  6. Obstructive sleep apnea.  7. Hypertension.  8. Hyperlipidemia.   RECOMMENDATIONS:  I suspect Harold [name] edema is related to venous  insufficiency since it is bilateral and not terribly impressive and I find  no other evidence of congestion and no jugular venous distention.  Will get  venous Doppler to rule  out deep venous thrombophlebitis and also get  arterial Dopplers to rule out claudication as a cause for his pain since his  pulses are somewhat decreased and he has no vascular disease.  Will give him  20 mg of Lasix.  I talked to him about support hose and about elevating his  feet.  He seemed a bit disinclined to use the support hose at present.  Will  get a follow-up BMP when he comes in for his Doppler studies.  I will see  him back in about three months and we will consider a Myoview scan to assess  him for restenosis at that time.            ______________________________  Everardo Beals Juanda Chance, MD, [facility]     BRB/MedQ  DD:  01/30/2006  DT:  02/01/2006  Job #:  [id number]   cc:   Valetta Mole. Swords, MD

## 2010-09-15 NOTE — Assessment & Plan Note (Signed)
[facility]                            CARDIOLOGY OFFICE NOTE   NAME:Mast, JERMON CHALFANT                   MRN:          [id number]  DATE:04/26/2006                            DOB:          09/29/1928    PRIMARY CARE PHYSICIAN:  Valetta Mole. Swords, MD   CLINICAL HISTORY:  Mr. Phegley is 77 years old.  He has had previous  bypass surgery and previous multiple percutaneous interventions; the  last of which was a stent placed at the anatomosis of a LIMA to the LAD  6 months ago.  He says he has been doing fairly well from this and has  had only occasional chest pain.  He does get short of breath with  exertion.  He also has had some edema to the lower extremities.   He has been in the rehabilitation program but says he is not able to do  very much because they restrict him.   PAST MEDICAL HISTORY:  Significant for abdominal aortic aneurysm repair,  renal insufficiency with creatinine of 1.7, hypertension,  hyperlipidemia, obstructive sleep apnea.   CURRENT MEDICATIONS:  Include Ocuvite, aspirin, alprazolam, Plavix,  Wellbutrin, Micardis.   EXAMINATION:  Blood pressure is 182/94 and the pulse 77 and regular.  There was no venous distention.  Carotid pulses were full without  bruits.  CHEST:  Clear with some decreased breath sounds.  CARDIAC:  Rhythm was regular.  He had no murmurs or gallops.  ABDOMEN:  Protuberant.  There is a midline scar.  The abdomen was soft  and normal bowel sounds.  There is no hepatosplenomegaly.  There is 1+ peripheral edema with stasis changes.   IMPRESSION:  1. Coronary artery disease status post prior coronary bypass graft      surgery in 1990 and multiple percutaneous coronary interventions,      the last of which was July 2007.  2. Good left ventricular function.  3. Status post abdominal aortic aneurysm repair.  4. Chronic renal insufficiency.  5. History of obstructive sleep apnea.  6. Hypertension.  7. Venous  insufficiency of the lower extremities.  8. Hyperlipidemia.   RECOMMENDATIONS:  I think Mr. Oestreicher is doing fairly well from the  standpoint of his heart.  We will call rehabilitation and see if they  can get him in to more activities and not restrict him so much.  We will  start him on Lasix 40 mg daily to help him with his edema and we will  start him back on Toprol 50 mg daily to help him with his blood  pressure.  We will get an  adenosine stress test Myoview scan to assess him for restenosis and I  will see him back in 6 months.  He will need a BMP next week after the  Lasix.     Bruce Elvera Lennox Juanda Chance, MD, [facility]  Electronically Signed    BRB/MedQ  DD: 04/26/2006  DT: 04/26/2006  Job #: [id number]

## 2010-09-15 NOTE — H&P (Signed)
NAME:  Harold [name], Harold [name]                      ACCOUNT NO.:  [id number]   MEDICAL RECORD NO.:  [id number]                   PATIENT TYPE:  INP   LOCATION:  NA                                   FACILITY:  [facility]   PHYSICIAN:  Larina Earthly, M.D.                 DATE OF BIRTH:  [birth year]   DATE OF ADMISSION:  DATE OF DISCHARGE:                                HISTORY & PHYSICAL   ANTICIPATED DATE OF ADMISSION:  July 05, 2003.   CHIEF COMPLAINT:  Abdominal aortic aneurysm.   HISTORY OF PRESENT ILLNESS:  This is a 77 year old man referred by Dr.  Cato Mulligan to Dr. Arbie Cookey for evaluation of an abdominal aortic aneurysm.  The  patient has undergone a recent urological evaluation, as he has a known  history of kidney stones and was found on ultrasound to have a large  abdominal aortic aneurysm.  Subsequent CT scan was obtained and this  revealed a 6.6 cm infrarenal abdominal aortic aneurysm.  He has been  asymptomatic in regards to this.  He underwent preoperative CT survey to  evaluate for possible stent graft usage.  However, he is not felt to be a  candidate for this.  He underwent cardiology evaluation including Cardiolite  and cardiac catheterization by Dr. Juanda Chance and he was found to have a lesion  in his diagonal coronary artery, where there is presently a stent that was  placed in 2002.  This will require further attention in the future.  However, he remains essentially asymptomatic in regards to this.  Due to the  size and risk stratification of his abdominal aortic aneurysm, it has been  agreed that he should proceed first with resection and grafting of the  aneurysm.  He denies abdominal pain, nausea, vomiting, constipation,  hematochezia, back pain, hematemesis, dysuria, hematuria, gastroesophageal  reflux symptoms.  He does admit to some claudication symptoms.  However, he  feels that these have improved since he started on a walking protocol.  He  does occasionally have peripheral  edema, primarily on the right side, that  he relates to a previous right knee injury and, in fact, he has been told by  Dr. Rennis Chris of Orthopaedics, that he should obtain a right knee replacement  in the future.  The patient also admits to shortness of breath and  specifically dyspnea on exertion.  However, he feels as though it requires a  fair amount of exertion to become symptomatic.  He will be admitted this  hospitalization electively on July 05, 2003 for resection and grafting of  his aneurysm by Dr. Arbie Cookey.   PAST MEDICAL HISTORY:  1. Coronary artery disease.  2. Hypertension.  3. Remote tobacco.  4. Hyperlipidemia.  5. History of obstructive sleep apnea and reportedly has used a BiPAP at     times.  6. Macular degeneration of the left eye.  7. Daily alcohol use.  8. History of  depression.  9. Peripheral vascular occlusive disease.  10.      History of gallstones.  11.      History of renal stones.  12.      History of mild renal insufficiency with a creatinine in the range     of 1.7 to 2.  13.      He has several liver cysts on CT scan.  14.      He is noted to have multiple prostate calculi.  15.      He has a right renal adenoma on CT scan.  16.      History of right knee fracture and, as stated previously, he has     been told that he requires a knee replacement in the future.   PAST SURGICAL HISTORY:  1. Coronary artery bypass grafting 1990 by Particia Lather, M.D.  2. PTA and stenting of his diagonal coronary artery in 2002 by Dr. Juanda Chance.  3. History of kidney stone surgery.  4. History of back surgery.  5. History of skin cancer, right leg, for basal cell carcinoma, status post     split thickness skin grafting approximately one year ago by Dr. Odis Luster.  6. History of laser eye surgery.   CURRENT MEDICATIONS:  1. Hyzaar 50/12.5 one q. day.  2. Wellbutrin 150 mg q. day.  3. Ocu-Vite two daily.  4. Cialis occasional use.   ALLERGIES:  No known drug allergies.    REVIEW OF SYMPTOMS:  Otherwise unremarkable.  Please see the history of the  present illness for significant positives.   FAMILY HISTORY:  Mother deceased at age 64 from cerebrovascular accident.  Father deceased at age 4 from complications of COPD.  He has no family  history of aneurysm, stroke or significant coronary artery disease.   SOCIAL HISTORY:  He is married.  He drinks approximately three vodka drinks  per night.  Tobacco use is substantial, but remote.  He smoked two packs per  day for approximately 45 years and quit in 1990.   PHYSICAL EXAMINATION:  VITAL SIGNS:  Blood pressure 158/88; pulse 76;  respirations 20.  GENERAL:  This is a 77 year old, obese, white male, in no acute distress,  who is alert and oriented times three.  HEENT:  Normocephalic, atraumatic.  Pupils equal, round and reactive to  light.  Extraocular movements are intact.  Oropharynx is clear without  exudates or erythema.  There do appear to be some bilateral cataracts with  clouding of his lens.  NECK:  Supple.  No jugulovenous distension.  No bruits.  No lymphadenopathy.  CARDIAC:  Regular rate and rhythm.  No murmurs, gallops or rubs.  Normal S1,  S2.  PULMONARY:  Symmetrical on inspiration.  No wheezes, rhonchi or rales.  ABDOMEN:  I am unable to palpate the aneurysm due to obesity.  Abdomen is  soft, nontender, normoactive bowel sounds.  No bruits.  GENITOURINARY AND RECTAL:  Deferred at this time.  EXTREMITIES:  No clubbing, cyanosis.  There is mild edema, right greater  than left, lower extremity around the ankle.  There are no skin ulcerations  or breakdowns.  Temperature is warm to the touch.  Peripheral pulses are  absent below the femoral bilaterally to palpation.  NEUROLOGIC:  Grossly nonfocal.  His gait is steady, but it is shuffling and  measured.  Muscle strength appears grossly normal.  Sensory is grossly  intact.   ASSESSMENT:  Large infrarenal abdominal aortic aneurysm for  elective resection  and grafting per Dr. Arbie Cookey on July 05, 2003.      Rowe Clack, P.A.-C.                    Larina Earthly, M.D.    Sherryll Burger  D:  07/01/2003  T:  07/02/2003  Job:  [id number]   cc:   Larina Earthly, M.D.  [address]  [place]  [place] [address]   Valetta Mole. Swords, M.D. [facility]   Charlies Constable, M.D.

## 2010-09-15 NOTE — H&P (Signed)
[facility] [place]  Patient:    Harold [name], Harold [name] Visit Number: [id number] MRN: [id number]          Service Type: CAT Location: [id number] [phone] Attending Physician:  Lenoria Farrier Dictated by:   Rozell Searing, P.A. Admit Date:  02/10/2001 Discharge Date: 02/11/2001                           History and Physical  ADDENDUM:  The patient was referred for an abdominal CT for follow up of a chest CT suggestive of right adrenal nodule.  Preliminary report per radiology was findings consistent with a benign adenoma. Dictated by:   Rozell Searing, P.A. Attending Physician:  Lenoria Farrier DD:  02/11/01 TD:  02/11/01 Job: [id number]

## 2010-09-15 NOTE — Assessment & Plan Note (Signed)
[name]                              CARDIOLOGY OFFICE NOTE   NAME:Harold [name], Harold [name]                   MRN:          [id number]  DATE:11/27/2005                            DOB:          19-Dec-1928    Patient seen in the [facility] clinic on November 27, 2005 post intervention.   This is a 77 year old married white male patient of Dr. Charlies Constable who has  a history of coronary artery disease, status post CABG in 1990.  He had a  stent placed in the anastomosis of a vein graft to a diagonal branch and a  drug-eluting stent placed for end-stage restenosis and had a cutting balloon  angioplasty for focal end-stage restenosis six months ago.  He also had a  stent placed to the ostium of his SVD to the RCA six months ago.  He has had  previous chronic total occlusion of a graft to the circumflex.  He recently  developed exertional dyspnea and had a Myoview scan showing anterolateral  ischemia.  He therefore had repeat cardiac catheterization and underwent  successful PCI of the anastomotic lesion of the LIMA to the LAD using a drug-  eluting Taxus stent with improvement from 90% to 10%.  He had severe native  vessel disease and patent vein graft to the PDA with less than 10% at the  stent site of the ostium, occlusion of the vein graft to the diagonal branch  of the LAD at the distal anastomosis site at the site of the previously  placed stent, and occlusion of the vein graft to the circumflex, which was  old.   Since the patient has been home, overall, he is doing quite well.  He has  taken a couple of nitroglycerin.  He was walking in [place] one day, and he  does not know whether the chest pain was due to anxiety or angina.  He has  started cardiac rehab and is doing well without chest pain and wants to  start water aerobics.   CURRENT MEDICATIONS:  1.  Ocuvite 1 b.i.d.  2.  Aspirin 325 daily.  3.  Alprazolam 0.5 mg nightly.  4.  Plavix 75  daily.  5.  Wellbutrin 150 b.i.d.  6.  Micardis 80 mg nightly, 40 in the morning.  7.  Vytorin 10/40 daily.   PHYSICAL EXAMINATION:  VITAL SIGNS:  Blood pressure 148/86, pulse 72, weight  221.  GENERAL:  This is a pleasant 77 year old white male in no acute distress.  NECK:  Without JVD, HJR, bruits, or thyroid enlargement.  LUNGS:  Clear anteriorly and posterolaterally.  HEART:  Regular rate and rhythm at 72 beats per minute.  Normal S1 and S2.  A 2/6 systolic murmur at the left sternal border.  ABDOMEN:  Soft without organomegaly, masses, lesions, or abnormal  tenderness.  Right groin without hematoma or hemorrhage.  EXTREMITIES:  Mild ankle edema, positive distal pulses.   IMPRESSION:  1.  Status post successful drug-eluting Taxus stent from the left internal      mammary artery to the left anterior descending artery on November 13, 2005.  2.  Status post coronary artery bypass graft x3 in 1990.  3.  Status post stent at the anastomosis of the vein graft to the diagonal      and drug-eluting stent placed for end-stent restenosis and cutting      balloon angioplasty for focal end-stent restenosis six months ago.  4.  Stent to the ostium and the saphenous vein graft to the right coronary      artery six months ago.  5.  Chronic total occlusion of the graft to the circumflex.  6.  Hyperlipidemia.  7.  Abdominal aortic aneurysm.  8.  History of syncope secondary to vasovagal reaction and dehydration.  9.  Obstructive sleep apnea.  10. History of depression.  11. Chronic renal insufficiency.  12. History of renal adenoma.  13. Remote history of alcohol use.   At this time, patient is doing well from a cardiac standpoint.  He will see  Dr. Juanda Chance back in one month.                                  Harold Reedy, PA-C    ML/MedQ  DD:  11/27/2005  DT:  11/27/2005  Job #:  [id number]

## 2010-09-15 NOTE — H&P (Signed)
[name], [name] NO.:  [id number]   MEDICAL RECORD NO.:  [id number]          PATIENT TYPE:  INP   LOCATION:  2016                         FACILITY:  [facility]   PHYSICIAN:  Charlies Constable, M.D. [facility] DATE OF BIRTH:  Sep 01, 1928   DATE OF ADMISSION:  05/23/2005  DATE OF DISCHARGE:                                HISTORY & PHYSICAL   Patient seen in the [facility] clinic on May 23, 2005, with Dr. Charlies Constable.   CHIEF COMPLAINT:  Chest pain.   HISTORY OF PRESENT ILLNESS:  This is a pleasant 77 year old married white  male patient with a history of coronary artery disease, status post CABG x4  in 5.  He underwent stent to the SVG to the diagonal in October 2002 and  PCI for an in-stent restenosis of the SVG to the diagonal in May 2005.  At  that time he had 30% SVG to the PDA, total SVG to the circumflex, and patent  LIMA to the LAD with 70-80% stenosis at anastomosis site.  All his native  vessels were occluded.  He had undergone abdominal aortic aneurysm repair  prior to the intervention on his SVG to the diagonal in May 2005.   The patient complains of a two-week history of daily chest pain described as  a tightness associated with shortness of breath.  This occurs both with  exertion and at rest.  He had gone to the car auction today and after eating  lunch he had an episode of chest tightness relieved with one nitroglycerin.  Last night while watching TV he had a more severe episode that took two  nitroglycerin to ease his pain.  He has had some exertional tightness and  shortness of breath.  His worst episode was a week ago while walking to the  car auction.  He is under a tremendous amount of financial stress at this  time as he is having trouble with some investment properties and his banking  issues and wonders if this is contributing to it.   ALLERGIES:  No known drug allergies.   CURRENT MEDICATIONS:  1.  Micardis 80 mg one-half daily.  2.   Wellbutrin 150 mg daily.  3.  Aspirin 325 mg daily.  4.  Ocuvite b.i.d.  5.  Hydrochlorothiazide 12.5 mg daily.   PAST MEDICAL HISTORY:  1.  History of syncope secondary to vasovagal and dehydration in the past.  2.  Abdominal aortic aneurysm repair in March 2005.  3.  Status post CABG x4 in 1990.  4.  Hypertension.  5.  Hyperlipidemia.  6.  History of tobacco abuse, quit in 1990.  7.  Obstructive sleep apnea.  8.  History of depression.  9.  Chronic renal insufficiency.  10. History of renal adenoma.  11. History of nephrolithiasis.  12. History of alcohol use.   SOCIAL HISTORY:  He is married.  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  He quit smoking in 1990.   FAMILY HISTORY:  Noncontributory.   REVIEW OF SYSTEMS:  Negative for dizziness or presyncopal signs or symptoms,  dyspepsia,  dysphagia, nausea, vomiting, change in bowels or melena.  CARDIOPULMONARY:  See HPI.   PHYSICAL EXAMINATION:  GENERAL:  This is a pleasant, overweight 77 year old  white male who is a bit anxious.  VITAL SIGNS:  Blood pressure 150/87, pulse 90, weight 225.  NECK:  Without JVD, HJR, bruit or thyroid enlargement.  HEENT:  Normocephalic without sign of trauma.  External eye movements are  intact.  Pupils equal, round, and reactive to light and accommodation.  The  mucosa is moist.  Throat is without erythema or exudate.  LUNGS:  Clear anterior, posterior and lateral.  CARDIAC:  Regular rate and rhythm at 90 beats per minute, normal S1 and S2,  distant heart sounds.  No murmur, rub, bruit, thrill or heave noted.  ABDOMEN:  Soft without organomegaly, masses, lesions, or abnormal  tenderness.  EXTREMITIES:  Trace of edema bilaterally, decreased distal pulses.  NEUROLOGIC:  Without focal deficit.   EKG:  Normal sinus rhythm, poor R-wave progression, inferior Q-waves, no  acute change.   IMPRESSION:  1.  Unstable angina.  2.  Coronary artery disease, status post coronary artery  bypass graft x4 in      1990 with a saphenous vein graft to the diagonal, saphenous vein graft      to the posterior descending artery, saphenous vein graft to the      circumflex, and left internal mammary artery to the left anterior      descending artery.  3.  Status post stent to the saphenous vein graft to the diagonal in October      2002.  4.  Status post percutaneous coronary intervention for in-stent restenosis      of the saphenous vein graft to the diagonal in May 2005.  5.  Status post abdominal aortic aneurysm R&G in March 2005.  6.  Chronic renal insufficiency, last creatinine was 1.5.  Has been as high      as 2.6.  7.  Hypertension.  8.  Hyperlipidemia.  9.  Remote history of tobacco use.  10. Obstructive sleep apnea.  11. History of depression.  12. History of renal adenoma.  13. History of nephrolithiasis.  14. History of alcohol use.  15. Anxiety.   PLAN:  Will admit the patient to the hospital with unstable angina and will  give him IV fluids for hydration and set him up for a cardiac  catheterization by Dr. Juanda Chance tomorrow.  The patient is agreeable.      Jacolyn Reedy, P.A. [facility]    ______________________________  Charlies Constable, M.D. [facility]    ML/MEDQ  D:  05/23/2005  T:  05/23/2005  Job:  [id number]

## 2010-09-15 NOTE — Cardiovascular Report (Signed)
NAME:  Harold [name], Harold [name] NO.:  [id number]   MEDICAL RECORD NO.:  [id number]          PATIENT TYPE:  OIB   LOCATION:  6523                         FACILITY:  [facility]   PHYSICIAN:  Charlies Constable, M.D. [facility] DATE OF BIRTH:  1929-04-18   DATE OF PROCEDURE:  11/13/2005  DATE OF DISCHARGE:                              CARDIAC CATHETERIZATION   Cardiac catheterization, graft study, and percutaneous coronary intervention  note.   CLINICAL HISTORY:  Mr. Harold [name] is 77 years old and had bypass surgery in  1990.  He has had stent placed at the anastomosis of a vein graft to a  diagonal branch and had a drug-eluting stent placed for in-stent restenosis  and then had a cutting balloon angioplasty for focal in-stent restenosis 6  months ago.  He also has had stent placed to the ostium of the vein graft to  the right coronary artery 6 months ago.  He has had previous chronic total  occlusion of the graft to the circumflex artery.  He recently developed  symptoms of angina and had a Myoview scan which showed anterior and lateral  ischemia.  For this reason he was brought in for evaluation with  angiography.   PROCEDURE:  The procedure was performed via the right femoral artery and  arterial sheath and 6-French preformed coronary catheters.  __________ was  performed and Omnipaque contrast was used.  We used an AL-1 catheter for  injection of the vein graft to the diagonal branch of the LAD.  We used a  right bypass graft catheter for injection of the vein graft to the right  coronary.  We used a LIMA catheter injection of the LIMA graft.  No left  ventriculogram was performed to save contrast because of the patient's renal  insufficiency with a creatinine of 1.7.   After completion of the diagnostic study, we made a decision to proceed with  intervention on the lesion at the anastomosis of the LIMA graft to the LAD.  The patient was given Angiomax bolus and infusion and had been on  chronic  Plavix.  We initially used a LIMA catheter and advanced a PT-2 light support  wire down the LIMA graft.  We had some trouble engaging the LIMA catheter.  We chose a 2.25 x 12-mm Maverick balloon and we were unable to advance this  down the tortuous LIMA graft due to poor guide support.  We then changed out  for a shepherd's crook guiding catheter.  With the help of PT-2 X support  wire, we were able to deep throat this catheter into the LIMA graft and gain  good guide support.  We used the X support wire, which we passed about  halfway down the LIMA as a support buddy wire and we passed a PT-2 light  support down across the lesion at the anastomosis of the LIMA graft to the  LAD.  We were then able to get a 2.25 x 12-mm Maverick down to the lesion  and performed one inflation to 10 atmospheres for 30 seconds.  We then  passed a 2.5 x 8-mm Taxus balloon down the  graft to the anastomotic lesion  and deployed this with one inflation of 14 atmospheres for 30 seconds.  We  postdilated with a 2.75 x 8-mm PowerSail, deploying this with one inflation  of 12 atmospheres for 30 seconds.  Final diagnostic study was then performed  through the guiding catheter.  The patient tolerated the procedure well and  left the laboratory in satisfactory condition.  We were unable to close the  right femoral artery due to a narrowing at the entry site.   RESULTS:  Left main coronary:  The left main coronary had a 80% distal  stenosis.   Left anterior descending artery:  The left anterior descending artery was  completely occluded at its origin.   Circumflex artery:  The circumflex artery was completely occluded after a  ramus branch.  There was a 90% stenosis in a small ramus branch.  The distal  circumflex artery filled via collaterals from the ramus branch.   Right coronary artery:  The right coronary artery was completely occluded  its proximal to mid portion.   The saphenous vein graft to the  posterior branch of the right coronary was  patent with less than 10% stenosis at the stent site at the ostium.  There  was 50% stenosis distal and proximal to the anastomosis in the posterior  descending branch.  The graft also filled two posterolateral branches, one  of which was fairly large.   The saphenous vein graft to the diagonal branch of the LAD was completely  occluded at the anastomosis within the stent.  There was 60% narrowing in  the proximal portion of the vein graft.  The diagonal branch filled via  collaterals from the distal right coronary artery.   The LIMA graft to LAD was patent but there was 90% stenosis just at the  anastomosis of the LAD.   No left ventriculogram was performed.   The aortic pressure was 178/90 with mean of 125 and the left ventricular  pressure was 178/18.   Following stenting of the lesion at the anastomosis of the LIMA graft to  LAD, the stenosis improved from 90% to 10%.  There was some plaque at the  distal edge of the stent which narrowed the artery just distal to the stent  about 30%.   CONCLUSIONS:  1.  Coronary artery disease,  status post coronary artery bypass graft      surgery 1990.  2.  Severe native vessel disease with total occlusion of the left anterior      descending artery, circumflex artery, and right coronary.  3.  Patent vein graft to the posterior descending branch of the right      coronary artery with less than 10% narrowing at the stent site at the      ostium, occlusion of the vein graft to diagonal branch of the left      anterior descending at the distal anastomosis at the site of the      previous placed stent, occlusion of the vein graft to circumflex artery      (old) and 90% stenosis of the left internal mammary artery graft to the      left anterior descending at the anastomosis to the left anterior      descending.  4.  Successful percutaneous coronary intervention of the anastomotic lesion     of left  internal mammary artery graft to left anterior descending using      a Taxus drug-eluting stent with improvement  in stent narrowing from 90%      to 10%.   DISPOSITION:  The patient was returned to post anesthesia for further  observation.           ______________________________  Charlies Constable, M.D. [facility]     BB/MEDQ  D:  11/13/2005  T:  11/14/2005  Job:  [id number]   cc:   Valetta Mole. Swords, M.D. [facility]  [address] [place]  [place] [address]

## 2010-09-15 NOTE — Cardiovascular Report (Signed)
NAMEINOCENCIO, ROY                      ACCOUNT NO.:  [id number]   MEDICAL RECORD NO.:  [id number]                   PATIENT TYPE:  OIB   LOCATION:  2899                                 FACILITY:  [facility]   PHYSICIAN:  Charlies Constable, M.D.                  DATE OF BIRTH:  Jun 05, 1928   DATE OF PROCEDURE:  06/30/2003  DATE OF DISCHARGE:  06/30/2003                              CARDIAC CATHETERIZATION   PROCEDURES PERFORMED:   CARDIOLOGIST:  Charlies Constable, M.D.   CLINICAL HISTORY:  Mr. Moody is 77 years old and had bypass surgery in  2000.  He had stenting of a vein graft to the diagonal branch of the LAD at  th anastomosis two years ago.  At that time he also had documented occlusion  of the vein graft to the circumflex artery.  He had a preoperative  screening.  He was recently found to have an abdominal aortic aneurysm by  ultrasound while looking for other causes and this was quantitated by CT  scan at 6.6 cm.  ______________ for a preoperative evaluation.  His  Cardiolite scan suggested inferolateral ischemia, so they brought him in for  a catheterization.  He is tentatively scheduled for abdominal aneurysmal  repair on Monday and is felt not to be a candidate for stent grafting.   DESCRIPTION OF PROCEDURE:  The procedure was performed via the right femoral  artery using arterial sheath and 6 French preformed coronary catheters.  A  front wall arterial puncture was performed and Omnipaque contrast was used.  The vein graft to the right coronary artery was injected with a right bypass  catheter. The vein graft to the diagonal was injected with a left bypass  graft catheter.  The LIMA graft was injected with a LIMA catheter.  No left  ventriculogram was performed to save contrast.  The patient's baseline  creatinine was 1.8.   The patient tolerated the procedure well and left the laboratory in  satisfactory condition.   RESULTS:  Left Main Coronary Artery:  The left main  coronary artery is free  of significant disease.   Left Anterior Descending Artery:  The left anterior descending artery was  completely occluded after a small diagonal branch.   Circumflex Artery:  The circumflex artery was completely occluded after a  small marginal branch.   Right Coronary Artery:  The right coronary artery is completely occluded at  its midportion after a right ventricular branch.   Grafts:  The saphenous vein graft to the posterior descending branch of the  right coronary artery was patent.  There was 30% proximal stenosis.  The  distal vessel fed a posterior descending and two posterolateral branches.  There was 40% narrowing in the posterior descending branch proximal to the  insertion site.   The saphenous vein graft to the diagonal branch of the LAD was patent, but  there was 90% stenosis at the stent  site at the anastomosis.  This vessel  fed the distal circumflex artery by collaterals.   The saphenous vein graft to the circumflex artery was completely occluded at  its origin (old).   The LIMA graft to the LAD was patent, but there was 70-80% stenosis at the  anastomosis.  There was marked tortuosity of the LIMA graft, which might  make intervention difficult.   No left ventriculogram was performed.   CONCLUSION:  1. Coronary artery disease status post prior coronary bypass graft surgery     in 2000.  2. Total occlusion of all native vessels.  3. Thirty percent narrowing in the vein graft to the posterior descending     branch of the right coronary artery, 90% in-stent restenosis at the     anastomosis of the vein graft to the diagonal branch of the left anterior     descending, total occlusion of the vein graft to the circumflex artery     (old), and a patent left internal mammary artery graft to the left     anterior descending with 70-80% stenosis at the anastomosis.  4. Ejection fraction of 56% by recent Cardiolite.   RECOMMENDATIONS:  The  patient has ischemia of the inferolateral leads, which  is in the circumflex distribution, which is supplied by collaterals from the  diagonal primarily.  The diagonal was compromised by 90% in-stent restenosis  at the anastomosis between the vein graft to the diagonal.   Were it not for the aneurysm we would recommend a drug-eluting stent for  treatment of in-stent restenosis at the anastomotic lesion of the saphenous  vein graft to the diagonal.  His aneurysm is 6.6 cm and his risk of rupture  over the next several months is high enough that makes this of concern.  We  have no clear ___________ revascularization of his diagonal will reduce his  surgical risk.   After reviewing this data with Drs. Regan Rakers and Early we have decided  to postpone any intervention and proceed with aneurysmal repair.  This will  be done at some increased risk because of his coronary lesions.  We will  plan to fix the in-stent restenosis some time after the aneurysm has been  repaired.                                               Charlies Constable, M.D.    BB/MEDQ  D:  06/30/2003  T:  07/01/2003  Job:  [id number]   cc:   Valetta Mole. Swords, M.D. [facility]   Larina Earthly, M.D.  [address]  [place]  [place] [address]   Cardiopulmonary Laboratory

## 2010-09-15 NOTE — Cardiovascular Report (Signed)
NAMEBENARD, MINTURN NO.:  [id number]   MEDICAL RECORD NO.:  [id number]          PATIENT TYPE:  OIB   LOCATION:  1963                         FACILITY:  [facility]   PHYSICIAN:  Everardo Beals. Juanda Chance, MD, FACCDATE OF BIRTH:  November 17, 1928   DATE OF PROCEDURE:  05/15/2006  DATE OF DISCHARGE:                            CARDIAC CATHETERIZATION   CLINICAL HISTORY:  Mr. Weckerly is 72s years old and has had remote  bypass surgery.  He has had multiple percutaneous interventions.  He has  had a stent placed at the anastomosis of the vein graft to the diagonal  branch of the LAD and then had a drug-eluting stent for restenosis, then  had cutting balloon angioplasty for restenosis and then developed  complete occlusion of this.  He has had a stent placed at the ostium of  the vein graft to the right coronary artery.  Last July he had a stent  placed at the anastomosis of the LIMA graft to the LAD which was a drug-  eluting stent.  Recently he developed some recurrent chest pain and had  a Myoview scan which suggested anterior ischemia and he was brought in  for evaluation with angiography.  His creatinine was 2.1 and we treated  him with bicarb protocol.   PROCEDURE:  The procedure was performed by the femoral arteries and  arterial sheath and 4-French preformed coronary catheters.  Front wall  arterial puncture was performed and Omnipaque contrast was used.  We  used a right bypass graft catheter for injection of the right coronary  vein graft.  We used an AL-1 diagnostic catheter for injection of the  vein graft to diagonal branch of the LAD.  We used a LIMA catheter for  injection of the LIMA graft.  Patient tolerated the procedure well and  left the laboratory in satisfactory condition.   RESULTS:  Left main coronary:  The left main coronary had a 80% diffuse  stenosis.   Left anterior descending artery:  The left anterior descending artery  was completely occluded after the  first septal perforator.   Circumflex artery:  The circumflex artery was completely occluded after  a small marginal and small atrial branch.  Distal marginal branch filled  via collaterals from the LAD.   Right coronary artery:  The right coronary artery was completely  occluded at its mid portion.   The saphenous vein graft to the posterior branch of the right coronary  was patent.  There was less than 10% stenosis at the stent site at the  ostium of the vein graft.  There is 40% narrowing in the posterior  descending branch proximal to the insertion of the vein graft.   The saphenous vein graft to the diagonal branch of the LAD was occluded  at the mid portion of the stent.  There is also a 70% stenosis in the  proximal portion of the vein graft.  The diagonal branch filled by well-  developed collaterals from the posterior descending branch of the right  coronary.  This occlusion represented an old occlusion.   The LIMA graft to the LAD  was patent and functioned well.  The stent at  the anastomosis to the LAD had less than 10% narrowing.   The vein graft to the circumflex artery that was occluded by prior  study.   HEMODYNAMIC RESULTS:  Aortic pressure was 141/67 with a mean of 94, left  neck pressure is 141/17.   CONCLUSION:  1. Severe coronary artery disease status post prior coronary bypass      graft surgery.  2. Severe native vessel disease with 80% stenosis of the left main      coronary artery, total occlusion of the left anterior descending      artery, circumflex and right coronaries.  3. Patent vein graft to the posterior descending branch of the right      coronary artery with less than 10% narrowing at the stent site in      the ostium of the vein graft, occluded vein graft to diagonal      branch of the LAD at the stent site located at the anastomosis with      a diagonal branch (old), occluded vein graft to the circumflex      artery (old), and patent left  internal mammary artery graft to the      left anterior descending with less than 10% narrowing at the stent      site at the anastomosis.  4. Good left ventricular function by noninvasive studies.   RECOMMENDATIONS:  The patient has not developed any restenosis at the  stent site at the anastomosis of the LIMA graft to the LAD.  His  ischemia on the scan appears to be related to the total occlusion of the  vein graft to the diagonal branch which is not new.  Will plan continued  medical therapy.  Blood pressure still somewhat elevated and will  increase the Norvasc that was recently started from 5-10.  Will arrange  follow-up in a couple of weeks.      Bruce Elvera Lennox Juanda Chance, MD, [facility]  Electronically Signed     BRB/MEDQ  D:  05/15/2006  T:  05/15/2006  Job:  [id number]   cc:   Valetta Mole. Swords, MD  Cardiopulmonary Lab

## 2010-09-15 NOTE — Discharge Summary (Signed)
NAME:  Harold [name], Harold [name]                      ACCOUNT NO.:  [id number]   MEDICAL RECORD NO.:  [id number]                   PATIENT TYPE:  INP   LOCATION:  3703                                 FACILITY:  [facility]   PHYSICIAN:  Charlies Constable, M.D. [facility]              DATE OF BIRTH:  03/27/1929   DATE OF ADMISSION:  07/17/2003  DATE OF DISCHARGE:  07/19/2003                                 DISCHARGE SUMMARY   PROCEDURES:  A 2-D echocardiogram   HOSPITAL COURSE:  Mr. Tugwell is a 77 year old male with a history of  bypass surgery in 1990.  He has had PTCA and stent since then, and in March  2005 was hospitalized for abdominal aortic aneurysm repair.  He was  discharged on July 09, 2003.  Since then he has gradually increased in  strength but still has been fairly weak.   On the day of admission, he was swallowing a pill and felt like it got  stuck.  He had an odd sensation in his throat. Additionally, he was having  some chest pain which he described as sharp and not like his usual angina.  His wife treated it with sublingual nitroglycerin x 2 and aspirin.  Apparently at some point after this, he stood up and became unresponsive.  His wife called 911.  He was evaluated in the emergency room where, by that  time, he was conscious and alert and pain free.  He as admitted for further  evaluation and treatment.   Dr. Jens Som evaluated Mr. Salceda and felt that he was slightly  dehydrated.  His Hyzaar was held, and he was given IV fluids.  His admission  BUN and creatinine were 66 and 2.9, which is higher than they were during  his hospital stay for the AAA repair.  During that hospital stay  postoperatively, his BUN and creatinine peaked at 31/2.4.  He was hydrated  overnight, and by July 19, 2003, his BUN was 54 and creatinine 2.2.  Additionally, he was seen by CVTS, and his staples were removed.  The  surgeons felt like he was doing well and could follow up as scheduled.  His  incision  looked good, and he had no signs of infection.   A 2-D echocardiogram is pending at the time of dictation, but Dr. Juanda Chance  evaluated Mr. Anastasi and felt that he could be discharged to home.  No  medication changes except for the Hyzaar being held for one day, and it was  okay to restart it in the morning.  He is to get a BMET in one week and  follow up in the office on April 6 at 10:45.   LABORATORY AND X-RAY DATA:  Hemoglobin 13.0, hematocrit 38.6, WBC 8.7,  platelets 295.  Sodium 137, potassium 3.9, chloride 103, CO2 27, BUN 54,  creatinine 2.2, glucose 105.  Serial CK-MB and troponin I negative for MI.   Chest x-ray:  Stable cardiomegaly with no active disease.   CONDITION ON DISCHARGE:  Improved.   DISCHARGE DIAGNOSES:  1. Syncope, possibly vasovagal plus/minus dehydration.  2. Status post abdominal aortic aneurysm repair, discharged July 09, 2003.  3. Status post aortocoronary bypass surgery in 1990 with saphenous vein     graft to posterior descending artery, saphenous vein graft to diagonal,     saphenous vein graft to circumflex, and left internal mammary artery to     left anterior descending .  4. Severe native three-vessel disease by catheterization in March 2005 with     patent grafts and a 90% stenosis in the saphenous vein graft to diagonal     which will be followed up by Dr. Juanda Chance as an outpatient.  5. Hypertension.  6. Hyperlipidemia.  7. Remote history of tobacco use.  8. Obstructive sleep apnea.  9. Depression.  10.      Chronic renal insufficiency.  11.      History of renal adenoma.  12.      History of nephrolithiasis.  13.      History of alcohol use.   DISCHARGE INSTRUCTIONS:  1. His activity level is to be as prior to admission.  2. He is to stick to a low-fat diet.  3. He is to clean over the incision without scrubbing.  4. He is to follow up with surgeons for further questions or concerns about     the incision.  5. He is to get a BMET on  Monday.  6. He is to follow up with [facility] on August 04, 2003, at 10:45.   DISCHARGE MEDICATIONS:  1. Hyzaar 50/12.5 mg p.o. daily.  2. Wellbutrin 150 mg p.o. daily.  3. Ocu-Vite daily.  4. Colace 9 mg b.i.d.  5. Tylox p.r.n.  6. Aspirin 325 mg p.o. daily.  7. Toprol XL 50 p.o. daily.      Theodore Demark, P.A. [facility]                  Charlies Constable, M.D. [facility]    RB/MEDQ  D:  07/19/2003  T:  07/21/2003  Job:  [id number]   cc:   Valetta Mole. Swords, M.D. [facility]   Larina Earthly, M.D.  [address]  [place]  [place] [address]

## 2010-09-29 ENCOUNTER — Ambulatory Visit (INDEPENDENT_AMBULATORY_CARE_PROVIDER_SITE_OTHER): Payer: Medicare Other | Admitting: Internal Medicine

## 2010-09-29 DIAGNOSIS — I5032 Chronic diastolic (congestive) heart failure: Secondary | ICD-10-CM

## 2010-09-29 DIAGNOSIS — E349 Endocrine disorder, unspecified: Secondary | ICD-10-CM

## 2010-09-29 DIAGNOSIS — I10 Essential (primary) hypertension: Secondary | ICD-10-CM

## 2010-09-29 DIAGNOSIS — E785 Hyperlipidemia, unspecified: Secondary | ICD-10-CM

## 2010-09-29 DIAGNOSIS — E291 Testicular hypofunction: Secondary | ICD-10-CM

## 2010-09-29 DIAGNOSIS — N259 Disorder resulting from impaired renal tubular function, unspecified: Secondary | ICD-10-CM

## 2010-09-29 DIAGNOSIS — I509 Heart failure, unspecified: Secondary | ICD-10-CM

## 2010-09-29 DIAGNOSIS — I2510 Atherosclerotic heart disease of native coronary artery without angina pectoris: Secondary | ICD-10-CM

## 2010-09-29 LAB — BASIC METABOLIC PANEL
BUN: 24 mg/dL — ABNORMAL HIGH (ref 6–23)
Chloride: 105 mEq/L (ref 96–112)
GFR: 47.2 mL/min — ABNORMAL LOW (ref >60.00)
Glucose, Bld: 121 mg/dL — ABNORMAL HIGH (ref 70–99)
Potassium: 3.9 mEq/L (ref 3.5–5.1)

## 2010-09-29 LAB — CBC WITH DIFFERENTIAL/PLATELET
Basophils Absolute: 0 10*3/uL (ref 0.0–0.1)
HCT: 41.3 % (ref 39.0–52.0)
Lymphs Abs: 1.1 10*3/uL (ref 0.7–4.0)
MCV: 87 fl (ref 78.0–100.0)
Monocytes Absolute: 0.6 10*3/uL (ref 0.1–1.0)
Platelets: 147 10*3/uL — ABNORMAL LOW (ref 150.0–400.0)
RDW: 16.9 % — ABNORMAL HIGH (ref 11.5–14.6)

## 2010-09-29 LAB — LIPID PANEL: Cholesterol: 149 mg/dL (ref 0–200)

## 2010-09-29 LAB — HEPATIC FUNCTION PANEL
ALT: 14 U/L (ref 0–53)
Alkaline Phosphatase: 74 U/L (ref 39–117)
Bilirubin, Direct: 0.1 mg/dL (ref 0.0–0.3)
Total Protein: 5.6 g/dL — ABNORMAL LOW (ref 6.0–8.3)

## 2010-09-29 MED ORDER — TESTOSTERONE CYPIONATE 200 MG/ML IM SOLN
200.0000 mg | Freq: Once | INTRAMUSCULAR | Status: AC
  Administered 2010-09-29: 200 mg via INTRAMUSCULAR

## 2010-09-29 NOTE — Assessment & Plan Note (Signed)
No current sxs. Continue meds

## 2010-09-29 NOTE — Assessment & Plan Note (Signed)
Chronic problem Clinically stable No need for change in meds at this time

## 2010-09-29 NOTE — Assessment & Plan Note (Signed)
Non healing scalp wound---at wound clinic

## 2010-09-29 NOTE — Assessment & Plan Note (Signed)
Needs lab work---followup

## 2010-09-29 NOTE — Assessment & Plan Note (Signed)
Not as well controlled Will continue same meds for now.

## 2010-10-03 ENCOUNTER — Ambulatory Visit (INDEPENDENT_AMBULATORY_CARE_PROVIDER_SITE_OTHER)
Admission: RE | Admit: 2010-10-03 | Discharge: 2010-10-03 | Disposition: A | Discharge status: Home / Self Care | Payer: Medicare Other | Source: Ambulatory Visit | Attending: Internal Medicine | Admitting: Internal Medicine

## 2010-10-03 ENCOUNTER — Encounter (HOSPITAL_BASED_OUTPATIENT_CLINIC_OR_DEPARTMENT_OTHER): Payer: Medicare Other | Attending: General Surgery

## 2010-10-03 DIAGNOSIS — R05 Cough: Secondary | ICD-10-CM

## 2010-10-03 DIAGNOSIS — R059 Cough, unspecified: Secondary | ICD-10-CM

## 2010-10-03 DIAGNOSIS — L98499 Non-pressure chronic ulcer of skin of other sites with unspecified severity: Secondary | ICD-10-CM | POA: Insufficient documentation

## 2010-10-03 NOTE — Progress Notes (Signed)
  Subjective:    Patient ID: Harold [name], male    DOB: Apr 18, 1929, 77 y.o.   MRN: [id number]  HPI  Complicated patient comes in for followup. His wife is present during the examination. Patient with hypertension. He states he is tolerating his medications without difficulty. Patient with coronary artery disease he denies any chest pain, shortness of breath or PND. Patient with diastolic heart failure he is tolerating medications. Patient has struggled with nonhealing wounds on his scalp. He is now seeing the wound clinic. Patient states he is making progress in that area. Patient with testosterone deficiency he is due for testosterone injection today.  Past Medical History  Diagnosis Date  . CAD (coronary artery disease)     no known MImultiple PCIsall vein grafts occluded, LIMA patent by cath 08/2008  . FUO (fever of unknown origin) 1994    resolved---unclear etiology  . Gout   . Macular degeneration     followed at [facility]  . PVD (peripheral vascular disease)   . OSA (obstructive sleep apnea)     using CPAP  . Diastolic heart failure     ef 40% echo 08/2008  . Hypertension   . Hyperlipidemia   . Renal insufficiency    Past Surgical History  Procedure Date  . Abdominal aortic aneurysm repair   . Transurethral resection of prostate   . Coronary artery bypass graft   . Cholecystectomy   . Lumbar disc surgery   . Coronary stent placement     multiple    reports that he quit smoking about 22 years ago. He does not have any smokeless tobacco history on file. His alcohol and drug histories not on file. family history includes Emphysema in his father. No Known Allergies  Review of Systems  patient denies chest pain, shortness of breath, orthopnea. Denies lower extremity edema, abdominal pain, change in appetite, change in bowel movements. Patient denies rashes, musculoskeletal complaints. No other specific complaints in a complete review of systems.         Physical  Exam Elderly, chronically ill-appearing male in no acute distress. He has a dressing on his scalp. HEENT exam: He has a dressing on the scalp. Neck is supple. Chest clear to auscultation cardiac exam S1-S2 are regular. Abdominal exam overweight, tomatoes, soft her extremities there is 1+ edema bilaterally at the ankles. Neurologic exam he is alert. He is a broad-based gait.       Assessment & Plan:

## 2010-10-04 ENCOUNTER — Telehealth: Payer: Self-pay

## 2010-10-04 MED ORDER — LEVOFLOXACIN 500 MG PO TABS: 500.0000 mg | ORAL_TABLET | Freq: Every day | ORAL | Status: AC

## 2010-10-04 NOTE — Telephone Encounter (Signed)
Message copied by Beverely Low on Wed Oct 04, 2010  4:10 PM ------      Message from: Staci Righter      Created: Wed Oct 04, 2010  3:51 PM       Received cxr report with original order dated one month ago. ?pt waited one month to get cxr and has even seen his primary recently since the order??  CXR was for cough.      On the right side there is a faint area of congestion that could be infection such as pneumonia. Typically recommend abx tx and followup cxr. Begin levaquin 500mg  po qd x 14 days (if not allergic and no interactions). Needs appt with myself of pmd after completing abx to re-assess and discuss timing of f/u cxr.

## 2010-10-04 NOTE — Telephone Encounter (Signed)
Pt's wife aware. Pt did just get cxr yesterday with persuasion from wife. Rx sent to [facility]

## 2010-10-12 ENCOUNTER — Telehealth: Payer: Self-pay | Admitting: *Deleted

## 2010-10-12 MED ORDER — AMOXICILLIN-POT CLAVULANATE 875-125 MG PO TABS: 1.0000 | ORAL_TABLET | Freq: Two times a day (BID) | ORAL | Status: AC

## 2010-10-12 NOTE — Telephone Encounter (Signed)
Could you give more details please. Is he having side effects and if so then what side effects. How many doses has he been able to take.

## 2010-10-12 NOTE — Telephone Encounter (Signed)
Pt has not been able to take Levaquin, and wife is calling to ask Dr. Rodena Medin what to do.  He is still coughing.

## 2010-10-12 NOTE — Telephone Encounter (Signed)
Spoke to pt's wife- he is vomiting with the Levaquin. He has taken 4-5 doses. He keep them down but didn't throw up. This is making him really nauseous.

## 2010-10-12 NOTE — Telephone Encounter (Signed)
Pt aware and rx sent to pharmacy. 

## 2010-10-12 NOTE — Telephone Encounter (Signed)
Can change to augmentin 875mg  bid x 10 days if not allergic and no interactions. May loosen stools

## 2010-10-26 NOTE — Assessment & Plan Note (Signed)
[facility]  NAME:  Harold [name], Harold [name] NO.:  [id number]  MEDICAL RECORD NO.:  [id number]      DATE OF BIRTH:  1928/09/06  PHYSICIAN:  Wayland Denis, DO            VISIT DATE:                                  OFFICE VISIT   Mr. Harold [name] is a 77 year old white male who is here in followup with his wife for evaluation of his scalp wound ulcer.  We have been using silver foam with compression and it has actually done very well with improvement in the overall appearance and size.  There has been no change in his past medical history, still eating well.  No change in his medications.  PHYSICAL EXAMINATION:  GENERAL:  He is alert and oriented, cooperative, very pleasant. EYES:  Extraocular muscles are intact. NECK:  No cervical lymphadenopathy. LUNGS:  Clear. HEART:  Regular. ABDOMEN:  Soft.  Scalp is as described in nurse's note and improving.  Left hypergranulation then previously seen.  I did use a little silver nitrate around the edges to help keep it from building back up again.  I would like to continue with the Foam Ag and compression and see him back in 2 weeks due to next week being July 4.     Wayland Denis, DO     CS/MEDQ  D:  10/25/2010  T:  10/26/2010  Job:  [id number]

## 2010-11-03 ENCOUNTER — Telehealth: Payer: Self-pay | Admitting: Internal Medicine

## 2010-11-03 NOTE — Telephone Encounter (Signed)
No need for another CXR

## 2010-11-03 NOTE — Telephone Encounter (Signed)
Pt still coughing. Need to know if pt needs to get another chest xray done? Pls advise.

## 2010-11-03 NOTE — Telephone Encounter (Signed)
pts wife aware, will call back if cough persist

## 2010-11-06 ENCOUNTER — Other Ambulatory Visit: Payer: Self-pay | Admitting: *Deleted

## 2010-11-06 ENCOUNTER — Encounter (HOSPITAL_BASED_OUTPATIENT_CLINIC_OR_DEPARTMENT_OTHER): Payer: Medicare Other | Attending: Plastic Surgery

## 2010-11-06 DIAGNOSIS — I509 Heart failure, unspecified: Secondary | ICD-10-CM | POA: Insufficient documentation

## 2010-11-06 DIAGNOSIS — F419 Anxiety disorder, unspecified: Secondary | ICD-10-CM

## 2010-11-06 DIAGNOSIS — I2510 Atherosclerotic heart disease of native coronary artery without angina pectoris: Secondary | ICD-10-CM | POA: Insufficient documentation

## 2010-11-06 DIAGNOSIS — I739 Peripheral vascular disease, unspecified: Secondary | ICD-10-CM | POA: Insufficient documentation

## 2010-11-06 DIAGNOSIS — L98499 Non-pressure chronic ulcer of skin of other sites with unspecified severity: Secondary | ICD-10-CM | POA: Insufficient documentation

## 2010-11-06 DIAGNOSIS — Z951 Presence of aortocoronary bypass graft: Secondary | ICD-10-CM | POA: Insufficient documentation

## 2010-11-06 DIAGNOSIS — Z79899 Other long term (current) drug therapy: Secondary | ICD-10-CM | POA: Insufficient documentation

## 2010-11-06 MED ORDER — ALPRAZOLAM 0.5 MG PO TABS: 0.5000 mg | ORAL_TABLET | Freq: Two times a day (BID) | ORAL | Status: DC | PRN

## 2010-11-08 NOTE — Progress Notes (Signed)
[facility]  NAME:  Harold [name], Harold [name]                 ACCOUNT NO.:  MEDICAL RECORD NO.:  [id number]      DATE OF BIRTH:  April 27, 1929  PHYSICIAN:  Wayland Denis, DO            VISIT DATE:                                  OFFICE VISIT   Harold [name] is a 77 year old white male who is here with his wife for reevaluation of his scalp wound.  He has been using foam with silver and compression over the last week, which has helped with the granulation. We did some silver nitrate again today.  The granulation is not as bad as it was, but it is quite friable and bleeds very easily.  The overall appearance is improved.  There has been no change in his medications or social history.  He is still eating well and with his wife.  PHYSICAL EXAMINATION:  GENERAL:  He is alert and oriented, cooperative in no acute distress, very pleasant. HEENT:  His pupils are equal.  Extraocular muscles are intact. LUNGS:  Clear. HEART:  Regular. ABDOMEN:  Soft.  The wound is improving some.  We will continue with the silver foam today.  If the granulation is improved, we may go back to Oasis.  It would be nice to do another application of Apligraf and this might be an option, but we will see how it does over the next week.     Wayland Denis, DO     CS/MEDQ  D:  10/18/2010  T:  10/19/2010  Job:  [id number]

## 2010-11-08 NOTE — Progress Notes (Signed)
[facility]  NAME:  BRECKIN, SAVANNAH                 ACCOUNT NO.:  MEDICAL RECORD NO.:  [id number]      DATE OF BIRTH:  10/26/1928  PHYSICIAN:  Wayland Denis, DO       VISIT DATE:  10/11/2010                                  OFFICE VISIT   Mr. Furness is here for followup on his scalp wound.  He used silver foam over the last week that helps with decrease in the granulation tissue formation.  The wound is getting smaller and he is doing well. Otherwise, there has been no change in his review of systems or social situation.  PHYSICAL EXAMINATION:  GENERAL:  He is alert, oriented, cooperative in no acute distress. HEENT:  Pupils are equal.  Extraocular muscles are intact. NECK:  No cervical lymphadenopathy. LUNGS:  Clear. HEART:  Regular.  Scalp measurements show improvement, 2.6 x 1.5 x 0.1.  There is still a bit of granulation tissue and silver nitrate was used to partly debride that and remove some of that hypergranulation.  ASSESSMENT:  Scalp wound.  PLAN:  Continue with the silver foam and see him back in a week.     Wayland Denis, DO     CS/MEDQ  D:  10/11/2010  T:  10/12/2010  Job:  [id number]

## 2010-11-15 NOTE — Progress Notes (Signed)
[facility]  NAME:  LEGION, DISCHER NO.:  [id number]  MEDICAL RECORD NO.:  [id number]      DATE OF BIRTH:  Sep 05, 1928  PHYSICIAN:  Wayland Denis, DO       VISIT DATE:  11/15/2010                                  OFFICE VISIT   HISTORY OF PRESENT ILLNESS:  Mr. Harold [name] is here with his wife for a followup on his scalp wound.  He used silver foam over the last couple weeks with some improvement.  He has been hypergranulating which has been the cause of [place] having to change the treatment some to prevent this.  Today, he has a slight bit.  I am not going to use any silver nitrate on this, but give it one more week to reevaluate it and hope that it will start to epithelialize.  MEDICATIONS:  He has not had any change in his medications.  REVIEW OF SYSTEMS:  Negative.  No new illnesses.  PHYSICAL EXAMINATION:  GENERAL:  He is alert, oriented, and cooperative. HEENT:  Pupils are equal.  Extraocular muscles are intact. NECK:  No cervical lymphadenopathy. LUNGS:  Clear. HEART:  Regular. ABDOMEN:  Soft. SKIN:  The scalp wound is as described, size is in the nurse's notes.  ASSESSMENT AND PLAN:  We will continue with the silver foam, multivitamin, and have him follow up in 2 weeks.     Wayland Denis, DO     CS/MEDQ  D:  11/15/2010  T:  11/15/2010  Job:  [id number]

## 2010-11-17 ENCOUNTER — Encounter: Payer: Self-pay | Admitting: Cardiology

## 2010-11-21 ENCOUNTER — Ambulatory Visit (INDEPENDENT_AMBULATORY_CARE_PROVIDER_SITE_OTHER): Payer: Medicare Other | Admitting: Cardiology

## 2010-11-21 ENCOUNTER — Encounter: Payer: Self-pay | Admitting: Cardiology

## 2010-11-21 DIAGNOSIS — I5032 Chronic diastolic (congestive) heart failure: Secondary | ICD-10-CM

## 2010-11-21 DIAGNOSIS — I10 Essential (primary) hypertension: Secondary | ICD-10-CM

## 2010-11-21 DIAGNOSIS — I2510 Atherosclerotic heart disease of native coronary artery without angina pectoris: Secondary | ICD-10-CM

## 2010-11-21 DIAGNOSIS — I509 Heart failure, unspecified: Secondary | ICD-10-CM

## 2010-11-21 DIAGNOSIS — E785 Hyperlipidemia, unspecified: Secondary | ICD-10-CM

## 2010-11-21 DIAGNOSIS — I25810 Atherosclerosis of coronary artery bypass graft(s) without angina pectoris: Secondary | ICD-10-CM

## 2010-11-21 DIAGNOSIS — R0602 Shortness of breath: Secondary | ICD-10-CM

## 2010-11-21 DIAGNOSIS — N259 Disorder resulting from impaired renal tubular function, unspecified: Secondary | ICD-10-CM

## 2010-11-21 MED ORDER — FUROSEMIDE 40 MG PO TABS: ORAL_TABLET | ORAL | Status: DC

## 2010-11-21 MED ORDER — POTASSIUM CHLORIDE CRYS ER 20 MEQ PO TBCR: 20.0000 meq | EXTENDED_RELEASE_TABLET | Freq: Every day | ORAL | Status: DC

## 2010-11-21 NOTE — Progress Notes (Signed)
PCP: Dr. Cato Mulligan  77 yo with history of CAD s/p CABG and diastolic CHF presents to cardiology clinic for followup.  Patient has been seen by Dr. Juanda Chance in the past and is seen by me for the first time today.  He had CABG in 1998.  Last cath in 2010 showed all grafts occluded except LIMA-LAD.  He was not a candidate for intervention or redo CABG.  He has significant diastolic CHF.  Patient additionally had a bad fall down an escalator at [facility] in the winter with scalp hematomas.  He has stable exertional dyspnea.  He can walk in the house without much problem but gets short of breath walking < 1 block.  No orthopnea.  No chest pain.  He walks with a cane.  He has significant lower extremity edema.  He has been taking 80 mg Lasix about three times a week.  Also, blood pressure has been running high (systolic in the 150s).    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 CAD: CABG in 1998.  Last cath in 2010 showed all native vessels occluded and all SVGs occluded, LIMA-LAD was patent with good collaterals to other circulations.  No interventional options and was not a good candidate for redo surgery.  2. Macular degeneration.  3. Gout 4. AAA s/p repair 5. PAD 6. OSA: CPAP 7. Diastolic CHF: Echo (5/10) with EF 55%, inferior hypokinesis, grade III diastolic dysfunction (restrictive), no significant valvular dysfunction.  8. 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  Nonsmoker.    FH: Noncontributory.   ROS: All systems reviewed and negative except as per HPI.   Current Outpatient Prescriptions  Medication Sig Dispense Refill  . allopurinol (ZYLOPRIM) 100 MG tablet Take 100 mg by mouth daily.        Marland Kitchen ALPRAZolam (XANAX) 0.5 MG tablet Take 1 tablet (0.5 mg total) by mouth 2 (two) times daily as needed.  60 tablet  2  . amLODipine (NORVASC) 10 MG tablet Take 10 mg by  mouth daily.        Marland Kitchen buPROPion (WELLBUTRIN SR) 150 MG 12 hr tablet Take 150 mg by mouth 2 (two) times daily.        . carvedilol (COREG) 6.25 MG tablet Take 6.25 mg by mouth 2 (two) times daily with meals.        Marland Kitchen doxycycline (VIBRAMYCIN) 100 MG capsule Take 100 mg by mouth 2 (two) times daily.        . fluticasone (FLONASE) 50 MCG/ACT nasal spray 2 sprays by Nasal route daily.  16 g  2  . HYDROcodone-acetaminophen (NORCO) 5-325 MG per tablet Take 1 tablet by mouth every 6 (six) hours as needed.        . isosorbide mononitrate (IMDUR) 30 MG 24 hr tablet Take 30 mg by mouth daily.        Marland Kitchen losartan (COZAAR) 50 MG tablet Take 50 mg by mouth daily.        . nitroGLYCERIN (NITROSTAT) 0.4 MG SL tablet Place 0.4 mg under the tongue every 5 (five) minutes as needed.        . polyethylene glycol (GLYCOLAX) packet Take 17 g by mouth daily.        . potassium chloride (MICRO-K) 10 MEQ CR capsule Take 10 mEq by mouth 3 (three) times a week.        Marland Kitchen  simvastatin (ZOCOR) 20 MG tablet TAKE 1 TABLET BY MOUTH ONCE A DAY  30 tablet  10  . testosterone cypionate (DEPOTESTOTERONE CYPIONATE) 200 MG/ML injection Inject into the muscle every 28 days.        Marland Kitchen DISCONTD: furosemide (LASIX) 80 MG tablet Take 80 mg by mouth 3 (three) times a week.        Marland Kitchen aspirin EC 81 MG tablet Take 1 tablet (81 mg total) by mouth daily.      . furosemide (LASIX) 40 MG tablet Take 1 and 1/2 tablets daily  45 tablet  6  . potassium chloride SA (KLOR-CON M20) 20 MEQ tablet Take 1 tablet (20 mEq total) by mouth daily.  30 tablet  6    BP 154/87  Pulse 62  Ht 5\' 10"  (1.778 m)  Wt 209 lb (94.802 kg)  BMI 29.99 kg/m2 General: NAD, frail Neck: JVP 8-9 cm, no thyromegaly or thyroid nodule.  Lungs: Clear to auscultation bilaterally with normal respiratory effort. CV: Nondisplaced PMI.  Heart regular S1/S2, no S3/S4, no murmur.  2+ edema to knees bilaterally.  No carotid bruit.  Difficult to feel pedal pulses given edema.   Abdomen:  Soft, nontender, no hepatosplenomegaly, no distention.  Neurologic: Alert and oriented x 3.  Psych: Normal affect. Extremities: No clubbing or cyanosis.

## 2010-11-21 NOTE — Assessment & Plan Note (Signed)
CKD.  Follow creatinine with Lasix changes.

## 2010-11-21 NOTE — Assessment & Plan Note (Signed)
Patient appears volume overloaded on exam.  He has stable NYHA class III symptoms.  I am going to have him start taking Lasix 60 mg every day along with KCl 20 mEq daily.  Will get BMET/BNP in 10 days and have him followup in 2 weeks.

## 2010-11-21 NOTE — Assessment & Plan Note (Signed)
Stable severe CAD.  Native vessels occluded and SVGs occluded. Has only LIMA-LAD with good collaterals to other circulations.  Is not a candidate for intervention or redo CABG.  He denies exertional chest pain.  He needs to restart ASA 81 mg daily (he is not sure why he has not been taking it).  He will continue Coreg, Imdur, ARB, statin.

## 2010-11-21 NOTE — Assessment & Plan Note (Signed)
BP is running high.  I will work on adjusting his diuretic this appointment and will address BP lowering when he returns in 2 weeks.

## 2010-11-21 NOTE — Assessment & Plan Note (Signed)
Lipids close to goal (LDL < 70) when recently checked.

## 2010-11-21 NOTE — Patient Instructions (Addendum)
Start Aspirin 81mg  daily-this should be enteric coated.  Increase Lasix(furosemide) to 60mg  daily--this will be one and one-half 40mg  tablets daily.  Start KCL(potassium) 20 mEq daily.  Schedule an appointment with Dr Shirlee Latch for 2 weeks Schedule an appointment for lab when you see Dr Shirlee Latch in 2 weeks--BMP/BNP 414.05  428.32

## 2010-11-28 ENCOUNTER — Encounter: Payer: Self-pay | Admitting: Cardiology

## 2010-11-29 ENCOUNTER — Encounter (HOSPITAL_BASED_OUTPATIENT_CLINIC_OR_DEPARTMENT_OTHER): Payer: Medicare Other | Attending: Plastic Surgery

## 2010-11-29 DIAGNOSIS — I2510 Atherosclerotic heart disease of native coronary artery without angina pectoris: Secondary | ICD-10-CM | POA: Insufficient documentation

## 2010-11-29 DIAGNOSIS — Z79899 Other long term (current) drug therapy: Secondary | ICD-10-CM | POA: Insufficient documentation

## 2010-11-29 DIAGNOSIS — I509 Heart failure, unspecified: Secondary | ICD-10-CM | POA: Insufficient documentation

## 2010-11-29 DIAGNOSIS — Z951 Presence of aortocoronary bypass graft: Secondary | ICD-10-CM | POA: Insufficient documentation

## 2010-11-29 DIAGNOSIS — L98499 Non-pressure chronic ulcer of skin of other sites with unspecified severity: Secondary | ICD-10-CM | POA: Insufficient documentation

## 2010-11-29 DIAGNOSIS — I739 Peripheral vascular disease, unspecified: Secondary | ICD-10-CM | POA: Insufficient documentation

## 2010-11-30 ENCOUNTER — Telehealth: Payer: Self-pay | Admitting: *Deleted

## 2010-11-30 NOTE — Telephone Encounter (Signed)
Called and spoke with patient's wife, who stated that her husband had sustained a fall in [employer] in December 2011 (fell down the escalators)and received numerous injuries. He is being treated at the wound care center. She asked that we call back in 2 months to schedule an appointment.

## 2010-12-05 ENCOUNTER — Encounter: Payer: Self-pay | Admitting: Cardiology

## 2010-12-05 ENCOUNTER — Telehealth: Payer: Self-pay | Admitting: *Deleted

## 2010-12-05 ENCOUNTER — Other Ambulatory Visit (INDEPENDENT_AMBULATORY_CARE_PROVIDER_SITE_OTHER): Payer: Medicare Other | Admitting: *Deleted

## 2010-12-05 ENCOUNTER — Ambulatory Visit (INDEPENDENT_AMBULATORY_CARE_PROVIDER_SITE_OTHER): Payer: Medicare Other | Admitting: Cardiology

## 2010-12-05 DIAGNOSIS — I25810 Atherosclerosis of coronary artery bypass graft(s) without angina pectoris: Secondary | ICD-10-CM

## 2010-12-05 DIAGNOSIS — R0602 Shortness of breath: Secondary | ICD-10-CM

## 2010-12-05 DIAGNOSIS — I2510 Atherosclerotic heart disease of native coronary artery without angina pectoris: Secondary | ICD-10-CM

## 2010-12-05 DIAGNOSIS — I5032 Chronic diastolic (congestive) heart failure: Secondary | ICD-10-CM

## 2010-12-05 DIAGNOSIS — E785 Hyperlipidemia, unspecified: Secondary | ICD-10-CM

## 2010-12-05 DIAGNOSIS — N259 Disorder resulting from impaired renal tubular function, unspecified: Secondary | ICD-10-CM

## 2010-12-05 DIAGNOSIS — I509 Heart failure, unspecified: Secondary | ICD-10-CM

## 2010-12-05 LAB — BASIC METABOLIC PANEL
BUN: 32 mg/dL — ABNORMAL HIGH (ref 6–23)
Chloride: 102 mEq/L (ref 96–112)
Creatinine, Ser: 2.2 mg/dL — ABNORMAL HIGH (ref 0.4–1.5)
GFR: 30.79 mL/min — ABNORMAL LOW (ref >60.00)

## 2010-12-05 LAB — BRAIN NATRIURETIC PEPTIDE: Pro B Natriuretic peptide (BNP): 233 pg/mL — ABNORMAL HIGH (ref 0.0–100.0)

## 2010-12-05 NOTE — Patient Instructions (Signed)
Lab today--BMP/BNP 428.32  414.05  Schedule an appointment with Dr Shirlee Latch in 1 month.

## 2010-12-05 NOTE — Assessment & Plan Note (Signed)
Volume status better but creatinine higher with diuresis.  As above, hold Lasix x 2 days then decrease to 40 mg daily.  Will also have him hold losartan for 2 days and restart it at prior dose.  BMET in 1 week.

## 2010-12-05 NOTE — Assessment & Plan Note (Signed)
Lipids close to goal (LDL < 70) when recently checked.   Followup in the office in 1 month.

## 2010-12-05 NOTE — Assessment & Plan Note (Signed)
Still with NYHA class III symptoms but improved.  Suspect some of fatigue and dyspnea is due to deconditioning.  Volume status is better, close to euvolemic.  Creatinine is up to 2.2.  Patient has been taking 80 mg daily Lasix rather than 60 mg daily.  I will have him hold Lasix x 2 days then restart on Lasix 40 mg daily.

## 2010-12-05 NOTE — Progress Notes (Signed)
PCP: Dr. Cato Mulligan  77 yo with history of CAD s/p CABG and diastolic CHF presents to cardiology clinic for followup.   He had CABG in 1998.  Last cath in 2010 showed all grafts occluded except LIMA-LAD.  He was not a candidate for intervention or redo CABG.  He has significant diastolic CHF.  Patient additionally had a bad fall down an escalator at [facility] in the winter with scalp hematomas.  At last appointment, patient was noted to be volume overloaded with significant exertional dyspnea.  I had him increase Lasix to 60 mg daily (he actually has been taking 80 mg daily) and his weight is down 6 lbs.  He is breathing easier.  He is able to walk around his house without dyspnea now.  He is short of breath with longer walking or with going up steps.  No orthopnea.  He is not very active in general.  No chest pain.   Labs (6/12): K 3.9, creatinine 1.5, LDL 82, HDL 33, LFTs normal Labs (1/61): K 3.8, creatinine 2.2  PMH: 1. CAD: CABG in 1998.  Last cath in 2010 showed all native vessels occluded and all SVGs occluded, LIMA-LAD was patent with good collaterals to other circulations.  No interventional options and was not a good candidate for redo surgery.  2. Macular degeneration.  3. Gout 4. AAA s/p repair 5. PAD 6. OSA: CPAP 7. Diastolic CHF: Echo (5/10) with EF 55%, inferior hypokinesis, grade III diastolic dysfunction (restrictive), no significant valvular dysfunction.  8. 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  Nonsmoker.    FH: Noncontributory.   ROS: All systems reviewed and negative except as per HPI.   Current Outpatient Prescriptions  Medication Sig Dispense Refill  . allopurinol (ZYLOPRIM) 100 MG tablet Take 100 mg by mouth daily.        Marland Kitchen ALPRAZolam (XANAX) 0.5 MG tablet Take 1 tablet (0.5 mg total) by mouth 2 (two) times daily as needed.  60 tablet  2  . amLODipine (NORVASC) 10 MG tablet Take 10 mg by mouth daily.        Marland Kitchen  aspirin EC 81 MG tablet Take 1 tablet (81 mg total) by mouth daily.      Marland Kitchen buPROPion (WELLBUTRIN SR) 150 MG 12 hr tablet Take 150 mg by mouth 2 (two) times daily.        . carvedilol (COREG) 6.25 MG tablet Take 6.25 mg by mouth 2 (two) times daily with meals.        . furosemide (LASIX) 40 MG tablet Take 1 and 1/2 tablets daily  45 tablet  6  . HYDROcodone-acetaminophen (NORCO) 5-325 MG per tablet Take 1 tablet by mouth every 6 (six) hours as needed.        . isosorbide mononitrate (IMDUR) 30 MG 24 hr tablet Take 30 mg by mouth daily.        Marland Kitchen losartan (COZAAR) 50 MG tablet Take 50 mg by mouth daily.        . nitroGLYCERIN (NITROSTAT) 0.4 MG SL tablet Place 0.4 mg under the tongue every 5 (five) minutes as needed.        . polyethylene glycol (GLYCOLAX) packet Take 17 g by mouth daily.        . potassium chloride SA (KLOR-CON M20) 20 MEQ tablet Take 1 tablet (20 mEq total) by mouth daily.  30 tablet  6  . simvastatin (ZOCOR) 20 MG tablet TAKE  1 TABLET BY MOUTH ONCE A DAY  30 tablet  10  . testosterone cypionate (DEPOTESTOTERONE CYPIONATE) 200 MG/ML injection Inject into the muscle every 28 days.        . NON FORMULARY at bedtime. CPAP         BP 132/78  Pulse 79  Ht 5' 10.5" (1.791 m)  Wt 203 lb 1.9 oz (92.135 kg)  BMI 28.73 kg/m2 General: NAD, frail Neck: JVP 7 cm, no thyromegaly or thyroid nodule.  Lungs: Clear to auscultation bilaterally with normal respiratory effort. CV: Nondisplaced PMI.  Heart regular S1/S2, no S3/S4, no murmur.  1+ edema 1/2 to knees bilaterally (improved).  No carotid bruit.  Difficult to feel pedal pulses given edema.   Abdomen: Soft, nontender, no hepatosplenomegaly, no distention.  Neurologic: Alert and oriented x 3.  Psych: Normal affect. Extremities: No clubbing or cyanosis.

## 2010-12-05 NOTE — Assessment & Plan Note (Signed)
Stable severe CAD.  Native vessels occluded and SVGs occluded. Has only LIMA-LAD with good collaterals to other circulations.  He is not a candidate for intervention or redo CABG.  He denies exertional chest pain. Continue ASA 81 mg, Coreg, Imdur, ARB, statin.

## 2010-12-05 NOTE — Telephone Encounter (Signed)
See BMP results from 12/05/10. Per pt's wife. Pt taking Lasix 80mg  daily and not Lasix 60mg  daily. Per Dr Marveen Reeks Lasix for 2 days then resume lasix 40mg  daily. Pt's wife is also aware pt should hold losartan for 2 days then resume same dose. Pt scheduled for repeat BMP 12/14/10.

## 2010-12-14 ENCOUNTER — Other Ambulatory Visit (INDEPENDENT_AMBULATORY_CARE_PROVIDER_SITE_OTHER): Payer: Medicare Other | Admitting: *Deleted

## 2010-12-14 DIAGNOSIS — I5032 Chronic diastolic (congestive) heart failure: Secondary | ICD-10-CM

## 2010-12-14 DIAGNOSIS — I25810 Atherosclerosis of coronary artery bypass graft(s) without angina pectoris: Secondary | ICD-10-CM

## 2010-12-14 DIAGNOSIS — I509 Heart failure, unspecified: Secondary | ICD-10-CM

## 2010-12-14 LAB — BASIC METABOLIC PANEL
BUN: 26 mg/dL — ABNORMAL HIGH (ref 6–23)
CO2: 28 mEq/L (ref 19–32)
Calcium: 8.7 mg/dL (ref 8.4–10.5)
Creatinine, Ser: 1.9 mg/dL — ABNORMAL HIGH (ref 0.4–1.5)

## 2010-12-20 NOTE — Progress Notes (Signed)
[facility]  NAME:  Harold [name], Harold [name] NO.:  [id number]  MEDICAL RECORD NO.:  [id number]      DATE OF BIRTH:  Aug 02, 1928  PHYSICIAN:  Wayland Denis, DO       VISIT DATE:  12/20/2010                                  OFFICE VISIT   Mr. Harold [name] is a 77 year old white male here with his wife for followup.  He had a fall from an escalator.  He has underwent Apligraf on the waist.  States he had some hypergranulation, so we were using a silver foam to keep that down with silver nitrate as well.  He has done well over the past week and does not have any hypergranulation, so we are going to go back to the Oasis.  There has been no change in his review of systems, medications, or social history.  PHYSICAL EXAMINATION:  GENERAL:  He is alert and oriented, cooperative in no distress. HEENT:  His extraocular muscles are intact. LUNGS:  His breathing is unlabored. HEART:  Regular.  The wound is as described in the nurse's note with size as listed according to the manufacturer's guideline.  We will see him back in 1 week.     Wayland Denis, DO     CS/MEDQ  D:  12/20/2010  T:  12/20/2010  Job:  [id number]

## 2010-12-27 NOTE — Progress Notes (Signed)
[facility]  NAME:  CORBY, VANDENBERGHE NO.:  [id number]  MEDICAL RECORD NO.:  [id number]      DATE OF BIRTH:  February 14, 1929  PHYSICIAN:  Wayland Denis, DO       VISIT DATE:  12/27/2010                                  OFFICE VISIT   Harold [name] is a 77 year old white male here for followup with his wife for scalp ulcer that he originally got when he fell down an escalator at [employer].  He has been doing extremely well.  He had an Oasis placed last week, which shows some benefit.  There is no change in his medications, social history, or review of systems.  GENERAL:  On exam, he is alert, oriented, cooperative, no acute distress. HEENT:  Pupils are equal.  Extraocular muscles are intact. NECK:  No cervical lymphadenopathy. LUNGS:  Breathing is unlabored. HEART:  Regular. ABDOMEN:  Soft. WOUND:  As described.  No sign of infection.  Oasis was applied.  The whole thing was used and fold it over, and we will have him follow back up in a week.     Wayland Denis, DO     CS/MEDQ  D:  12/27/2010  T:  12/27/2010  Job:  [id number]

## 2010-12-28 ENCOUNTER — Other Ambulatory Visit: Payer: Self-pay | Admitting: Cardiovascular Disease

## 2010-12-28 ENCOUNTER — Other Ambulatory Visit: Payer: Self-pay | Admitting: Internal Medicine

## 2011-01-02 ENCOUNTER — Telehealth: Payer: Self-pay | Admitting: Cardiology

## 2011-01-02 NOTE — Telephone Encounter (Signed)
Pt spouse return call from friday

## 2011-01-02 NOTE — Telephone Encounter (Signed)
I gave pt's wife lab results from 12/14/10.

## 2011-01-04 ENCOUNTER — Encounter (HOSPITAL_BASED_OUTPATIENT_CLINIC_OR_DEPARTMENT_OTHER): Payer: Medicare Other | Attending: Internal Medicine

## 2011-01-04 DIAGNOSIS — L98499 Non-pressure chronic ulcer of skin of other sites with unspecified severity: Secondary | ICD-10-CM | POA: Insufficient documentation

## 2011-01-11 ENCOUNTER — Other Ambulatory Visit: Payer: Self-pay | Admitting: Internal Medicine

## 2011-01-11 NOTE — Telephone Encounter (Signed)
Pt req refill of ALPRAZolam (XANAX) 0.5 MG tablet to [facility] on [address].

## 2011-01-12 ENCOUNTER — Ambulatory Visit (INDEPENDENT_AMBULATORY_CARE_PROVIDER_SITE_OTHER): Payer: Medicare Other | Admitting: Cardiology

## 2011-01-12 ENCOUNTER — Telehealth: Payer: Self-pay | Admitting: *Deleted

## 2011-01-12 ENCOUNTER — Encounter: Payer: Self-pay | Admitting: Cardiology

## 2011-01-12 DIAGNOSIS — I2510 Atherosclerotic heart disease of native coronary artery without angina pectoris: Secondary | ICD-10-CM

## 2011-01-12 DIAGNOSIS — I5032 Chronic diastolic (congestive) heart failure: Secondary | ICD-10-CM

## 2011-01-12 DIAGNOSIS — R253 Fasciculation: Secondary | ICD-10-CM

## 2011-01-12 DIAGNOSIS — I25810 Atherosclerosis of coronary artery bypass graft(s) without angina pectoris: Secondary | ICD-10-CM

## 2011-01-12 DIAGNOSIS — R0602 Shortness of breath: Secondary | ICD-10-CM

## 2011-01-12 DIAGNOSIS — I509 Heart failure, unspecified: Secondary | ICD-10-CM

## 2011-01-12 DIAGNOSIS — E785 Hyperlipidemia, unspecified: Secondary | ICD-10-CM

## 2011-01-12 DIAGNOSIS — I10 Essential (primary) hypertension: Secondary | ICD-10-CM

## 2011-01-12 DIAGNOSIS — N259 Disorder resulting from impaired renal tubular function, unspecified: Secondary | ICD-10-CM

## 2011-01-12 MED ORDER — FUROSEMIDE 40 MG PO TABS: ORAL_TABLET | ORAL | Status: DC

## 2011-01-12 MED ORDER — CARVEDILOL 12.5 MG PO TABS: 12.5000 mg | ORAL_TABLET | Freq: Two times a day (BID) | ORAL | Status: DC

## 2011-01-12 NOTE — Telephone Encounter (Signed)
Called pt to give results of blood work done on 12/14/10, but pt was here today for o.v. With dr Shirlee Latch and given results by him--nt

## 2011-01-12 NOTE — Patient Instructions (Addendum)
Increase Lasix(furosemide) to 40mg  twice a day for 2 days, then decrease to 40mg  in the morning and 20mg  in the afternoon around 4PM.   Increase Coreg to 12.5mg  twice a day. You can take two 6.25mg  tablets twice a day.  Your physician recommends that you return for lab work in: 2 weeks--BMP/BNP 428.32  414.05  Your physician recommends that you schedule a follow-up appointment in: 1 month with Dr Shirlee Latch.

## 2011-01-14 DIAGNOSIS — R253 Fasciculation: Secondary | ICD-10-CM | POA: Insufficient documentation

## 2011-01-14 NOTE — Assessment & Plan Note (Signed)
He appears more volume overloaded after going down on Lasix due to elevated creatinine.  NYHA class III symptoms.  I will increase Lasix to 40 mg bid x 2 days then decrease it to 40 mg qam, 20 mg qpm.  BMET/BNP in 2 wks.  I will have him followup for re-evaluation in 1 month.

## 2011-01-14 NOTE — Assessment & Plan Note (Signed)
Stable severe CAD.  Native vessels occluded and SVGs occluded. Has only LIMA-LAD with good collaterals to other circulations.  He is not a candidate for intervention or redo CABG.  He denies exertional chest pain. Continue ASA 81 mg, Coreg, Imdur, ARB, statin.  

## 2011-01-14 NOTE — Assessment & Plan Note (Signed)
Lipids close to goal (LDL < 70) when recently checked.  

## 2011-01-14 NOTE — Progress Notes (Signed)
PCP: Dr. Cato Mulligan  77 yo with history of CAD s/p CABG and diastolic CHF presents to cardiology clinic for followup.   He had CABG in 1998.  Last cath in 2010 showed all grafts occluded except LIMA-LAD.  He was not a candidate for intervention or redo CABG.  He has significant diastolic CHF.  I recently decreased his Lasix due to elevated creatinine, and weight is up by 8 lbs.  He is short of breath after walking about 50 feet.  No significant orthopnea, no PND, no chest pain.  Lower extremity edema is still present.    Patient also reports muscle twitching when he flexes his right arm.  This has been quite bothersome, and he is unable to use his right hand to eat.  Blood pressure is high today at 167/79.     Labs (6/12): K 3.9, creatinine 1.5, LDL 82, HDL 33, LFTs normal Labs (9/60): K 3.8, creatinine 2.2 => 1.9  PMH: 1. CAD: CABG in 1998.  Last cath in 2010 showed all native vessels occluded and all SVGs occluded, LIMA-LAD was patent with good collaterals to other circulations.  No interventional options and was not a good candidate for redo surgery.  2. Macular degeneration.  3. Gout 4. AAA s/p repair 5. PAD 6. OSA: CPAP 7. Diastolic CHF: Echo (5/10) with EF 55%, inferior hypokinesis, grade III diastolic dysfunction (restrictive), no significant valvular dysfunction.  8. 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  Nonsmoker.    FH: Noncontributory.   ROS: All systems reviewed and negative except as per HPI.   Current Outpatient Prescriptions  Medication Sig Dispense Refill  . allopurinol (ZYLOPRIM) 100 MG tablet Take 100 mg by mouth daily.        Marland Kitchen ALPRAZolam (XANAX) 0.5 MG tablet Take 1 tablet (0.5 mg total) by mouth 2 (two) times daily as needed.  60 tablet  2  . amLODipine (NORVASC) 5 MG tablet TAKE 1 TABLET BY MOUTH ONCE A DAY  90 tablet  3  . aspirin EC 81 MG tablet Take 1 tablet (81 mg total) by mouth daily.      Marland Kitchen buPROPion  (WELLBUTRIN SR) 150 MG 12 hr tablet Take 150 mg by mouth 2 (two) times daily.        . isosorbide mononitrate (IMDUR) 30 MG 24 hr tablet TAKE 1 TABLET ONCE DAILY  30 tablet  8  . losartan (COZAAR) 50 MG tablet Take 50 mg by mouth daily.        . nitroGLYCERIN (NITROSTAT) 0.4 MG SL tablet Place 0.4 mg under the tongue every 5 (five) minutes as needed.        . polyethylene glycol (GLYCOLAX) packet Take 17 g by mouth daily.        . potassium chloride SA (KLOR-CON M20) 20 MEQ tablet Take 1 tablet (20 mEq total) by mouth daily.  30 tablet  6  . simvastatin (ZOCOR) 20 MG tablet TAKE 1 TABLET BY MOUTH ONCE A DAY  30 tablet  10  . testosterone cypionate (DEPOTESTOTERONE CYPIONATE) 200 MG/ML injection Inject into the muscle every 28 days.        . carvedilol (COREG) 12.5 MG tablet Take 1 tablet (12.5 mg total) by mouth 2 (two) times daily.  30 tablet  6  . furosemide (LASIX) 40 MG tablet Take one twice a day for 2 days, then decrease to one in the morning and one-half in the afternoon  around 4PM  50 tablet  6    BP 167/79  Pulse 67  Ht 5\' 10"  (1.778 m)  Wt 211 lb (95.709 kg)  BMI 30.28 kg/m2 General: NAD, frail Neck: JVP 9-10 cm, no thyromegaly or thyroid nodule.  Lungs: Clear to auscultation bilaterally with normal respiratory effort. CV: Nondisplaced PMI.  Heart regular S1/S2, no S3/S4, no murmur.  1+ edema 2/3 to knees bilaterally.  No carotid bruit.  Difficult to feel pedal pulses given edema.   Abdomen: Soft, nontender, no hepatosplenomegaly, no distention.  Neurologic: Alert and oriented x 3.  Psych: Normal affect. Extremities: No clubbing or cyanosis.

## 2011-01-14 NOTE — Assessment & Plan Note (Signed)
Right biceps twitching with flexion is becoming very bothersome.  He is unable to eat with his right hand.  He wants to know if something can be done about this.  I will refer him to neurology for evaluation.

## 2011-01-14 NOTE — Assessment & Plan Note (Signed)
Renal dysfunction makes effective diuresis more difficult.  I am going to increase Lasix today but will follow renal function closely.

## 2011-01-14 NOTE — Assessment & Plan Note (Signed)
Increase Coreg to 12.5 mg bid.   

## 2011-01-15 ENCOUNTER — Other Ambulatory Visit: Payer: Self-pay | Admitting: *Deleted

## 2011-01-15 DIAGNOSIS — F419 Anxiety disorder, unspecified: Secondary | ICD-10-CM

## 2011-01-15 MED ORDER — ALPRAZOLAM 0.5 MG PO TABS: 0.5000 mg | ORAL_TABLET | Freq: Two times a day (BID) | ORAL | Status: DC | PRN

## 2011-01-15 NOTE — Telephone Encounter (Signed)
Left a message with the patient's wife for the patient to return my call. Noted in the system to send a letter.

## 2011-01-22 ENCOUNTER — Telehealth: Payer: Self-pay | Admitting: *Deleted

## 2011-01-22 NOTE — Telephone Encounter (Signed)
Wife is calling to speak to [facility], please.

## 2011-01-24 ENCOUNTER — Other Ambulatory Visit (INDEPENDENT_AMBULATORY_CARE_PROVIDER_SITE_OTHER): Payer: Medicare Other | Admitting: *Deleted

## 2011-01-24 DIAGNOSIS — I509 Heart failure, unspecified: Secondary | ICD-10-CM

## 2011-01-24 DIAGNOSIS — R0602 Shortness of breath: Secondary | ICD-10-CM

## 2011-01-24 DIAGNOSIS — I25810 Atherosclerosis of coronary artery bypass graft(s) without angina pectoris: Secondary | ICD-10-CM

## 2011-01-24 DIAGNOSIS — I5032 Chronic diastolic (congestive) heart failure: Secondary | ICD-10-CM

## 2011-01-24 LAB — BASIC METABOLIC PANEL
BUN: 24 mg/dL — ABNORMAL HIGH (ref 6–23)
CO2: 29 mEq/L (ref 19–32)
Calcium: 8.8 mg/dL (ref 8.4–10.5)
Creatinine, Ser: 1.6 mg/dL — ABNORMAL HIGH (ref 0.4–1.5)
GFR: 45.77 mL/min — ABNORMAL LOW (ref >60.00)
Glucose, Bld: 81 mg/dL (ref 70–99)

## 2011-01-24 NOTE — Progress Notes (Signed)
[facility]  NAME:  Harold [name] NO.:  [id number]  MEDICAL RECORD NO.:  [id number]      DATE OF BIRTH:  12-02-1928  PHYSICIAN:  Wayland Denis, DO       VISIT DATE:  01/24/2011                                  OFFICE VISIT   HISTORY OF PRESENT ILLNESS:  Mr. Harold [name] is here with his wife for a followup on his scalp ulcer.  He has been using Oasis but had some hypergranulation, so we are now using Silvercel on that with Tielle which has helped.  He does have some more healing and granulating. There is still little hypergranulation, so we will use silver nitrate on that.  There has been no change in his medications or social history. He is still at home.  PHYSICAL EXAMINATION:  GENERAL:  He is alert, oriented, cooperative, and not in any acute distress. HEENT:  Pupils are equal.  Extraocular muscles are intact. NECK:  No cervical lymphadenopathy. LUNGS:  Breathing is unlabored. HEART:  Rate is regular. SKIN:  Wound is as described and slightly smaller.  ASSESSMENT AND PLAN:  Silver nitrate used today.  We will continue with Silvercel, Tielle, and follow up in 2 weeks.     Wayland Denis, DO     CS/MEDQ  D:  01/24/2011  T:  01/24/2011  Job:  [id number]

## 2011-01-24 NOTE — Telephone Encounter (Signed)
LMTCB

## 2011-01-26 ENCOUNTER — Other Ambulatory Visit: Payer: Medicare Other | Admitting: *Deleted

## 2011-02-06 ENCOUNTER — Ambulatory Visit (INDEPENDENT_AMBULATORY_CARE_PROVIDER_SITE_OTHER): Payer: Medicare Other

## 2011-02-06 DIAGNOSIS — Z23 Encounter for immunization: Secondary | ICD-10-CM

## 2011-02-07 ENCOUNTER — Encounter (HOSPITAL_BASED_OUTPATIENT_CLINIC_OR_DEPARTMENT_OTHER): Payer: Medicare Other | Attending: Plastic Surgery

## 2011-02-07 DIAGNOSIS — L98499 Non-pressure chronic ulcer of skin of other sites with unspecified severity: Secondary | ICD-10-CM | POA: Insufficient documentation

## 2011-02-07 NOTE — Progress Notes (Signed)
[facility]  NAME:  Harold [name], Harold [name] NO.:  [id number]  MEDICAL RECORD NO.:  [id number]      DATE OF BIRTH:  1928-09-17  PHYSICIAN:  Harold Denis, DO       VISIT DATE:  02/07/2011                                  OFFICE VISIT   Harold [name] is a 77 year old male here with his wife for followup on the scalp ulcer.  He used Silvercel last week with silver nitrate used to help with the hypergranulation, he has actually done extremely well and has healed significant amount since his last visit.  There has been no change in his medications, review of systems, and social history.  EXAM:  GENERAL:  He is alert, oriented, cooperative, not in any acute distress, very pleasant. HEENT:  Pupils are equal, extraocular muscles are intact. NECK:  No cervical lymphadenopathy. RESPIRATORY:  Breathing is unlabored. HEART:  Regular. ABDOMEN:  Soft.  The wound is as described and markedly improved.  We will continue with Silvercel and see him back in 1 week.     Harold Denis, DO     CS/MEDQ  D:  02/07/2011  T:  02/07/2011  Job:  [id number]

## 2011-02-22 ENCOUNTER — Encounter: Payer: Self-pay | Admitting: Cardiology

## 2011-02-22 ENCOUNTER — Ambulatory Visit (INDEPENDENT_AMBULATORY_CARE_PROVIDER_SITE_OTHER): Payer: Medicare Other | Admitting: Cardiology

## 2011-02-22 DIAGNOSIS — I25810 Atherosclerosis of coronary artery bypass graft(s) without angina pectoris: Secondary | ICD-10-CM

## 2011-02-22 DIAGNOSIS — I2510 Atherosclerotic heart disease of native coronary artery without angina pectoris: Secondary | ICD-10-CM

## 2011-02-22 DIAGNOSIS — R0602 Shortness of breath: Secondary | ICD-10-CM

## 2011-02-22 DIAGNOSIS — I509 Heart failure, unspecified: Secondary | ICD-10-CM

## 2011-02-22 DIAGNOSIS — I10 Essential (primary) hypertension: Secondary | ICD-10-CM

## 2011-02-22 DIAGNOSIS — I5032 Chronic diastolic (congestive) heart failure: Secondary | ICD-10-CM

## 2011-02-22 DIAGNOSIS — N259 Disorder resulting from impaired renal tubular function, unspecified: Secondary | ICD-10-CM

## 2011-02-22 MED ORDER — AMLODIPINE BESYLATE 10 MG PO TABS: 10.0000 mg | ORAL_TABLET | Freq: Every day | ORAL | Status: DC

## 2011-02-22 MED ORDER — FUROSEMIDE 40 MG PO TABS: 40.0000 mg | ORAL_TABLET | Freq: Two times a day (BID) | ORAL | Status: DC

## 2011-02-22 NOTE — Assessment & Plan Note (Signed)
Stable severe CAD.  Native vessels occluded and SVGs occluded. Has only LIMA-LAD with good collaterals to other circulations.  He is not a candidate for intervention or redo CABG.  He denies exertional chest pain. Continue ASA 81 mg, Coreg, Imdur, ARB, statin.  

## 2011-02-22 NOTE — Assessment & Plan Note (Signed)
Harold [name] is still quite volume overloaded though weight is down 2 lbs since last appointment.  He is not always compliant with Lasix.  I am going to have him increase Lasix to 40 mg bid.  He will make sure to take it as prescribed every day.  BMET/BNP today and in 2 weeks.  He will followup with me again in 6 weeks.

## 2011-02-22 NOTE — Patient Instructions (Addendum)
Increase Lasix(furosemide) to 40mg  twice a day.  Increase amlodipine to 10mg  daily.  Your physician recommends that you have  lab work today--BMP/BNP 414.05  428.32  Your physician recommends that you return for lab work in 2 weeks--BMP/BNP  414.05  428.32   Your physician recommends that you schedule a follow-up appointment in: 6 weeks with Dr Shirlee Latch.

## 2011-02-22 NOTE — Assessment & Plan Note (Signed)
Creatinine was improved to 1.6 when last checked in 9/12.

## 2011-02-22 NOTE — Progress Notes (Signed)
PCP: Dr. Cato Mulligan  77 yo with history of CAD s/p CABG and diastolic CHF presents to cardiology clinic for followup.   He had CABG in 1998.  Last cath in 2010 showed all grafts occluded except LIMA-LAD.  He was not a candidate for intervention or redo CABG.  He has significant diastolic CHF.  At last appointment, I increased his Lasix.   Weight is down 2 lbs since last appointment.  He is still short of breath walking up an incline or up steps.  He does not always remember to take his Lasix.  He continues to have lower extremity edema.  No chest pain. He is not very active.   Systolic BP continues to run in the 150s-160s at home.   Labs (6/12): K 3.9, creatinine 1.5, LDL 82, HDL 33, LFTs normal Labs (1/61): K 3.8, creatinine 2.2 => 1.9 Labs (9/12): K 4.1, creatinine 1.6, BNP 447  PMH: 1. CAD: CABG in 1998.  Last cath in 2010 showed all native vessels occluded and all SVGs occluded, LIMA-LAD was patent with good collaterals to other circulations.  No interventional options and was not a good candidate for redo surgery.  2. Macular degeneration.  3. Gout 4. AAA s/p repair 5. PAD 6. OSA: CPAP 7. Diastolic CHF: Echo (5/10) with EF 55%, inferior hypokinesis, grade III diastolic dysfunction (restrictive), no significant valvular dysfunction.  8. 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  Nonsmoker.    FH: Noncontributory.   ROS: All systems reviewed and negative except as per HPI.   Current Outpatient Prescriptions  Medication Sig Dispense Refill  . allopurinol (ZYLOPRIM) 100 MG tablet Take 100 mg by mouth daily.        Marland Kitchen ALPRAZolam (XANAX) 0.5 MG tablet Take 1 tablet (0.5 mg total) by mouth 2 (two) times daily as needed.  60 tablet  2  . aspirin EC 81 MG tablet Take 1 tablet (81 mg total) by mouth daily.      Marland Kitchen buPROPion (WELLBUTRIN SR) 150 MG 12 hr tablet Take 150 mg by mouth 2 (two) times daily.        . carvedilol (COREG) 12.5 MG tablet  Take 1 tablet (12.5 mg total) by mouth 2 (two) times daily.  30 tablet  6  . isosorbide mononitrate (IMDUR) 30 MG 24 hr tablet TAKE 1 TABLET ONCE DAILY  30 tablet  8  . losartan (COZAAR) 50 MG tablet Take 50 mg by mouth daily.        . nitroGLYCERIN (NITROSTAT) 0.4 MG SL tablet Place 0.4 mg under the tongue every 5 (five) minutes as needed.        . polyethylene glycol (GLYCOLAX) packet Take 17 g by mouth daily.        . potassium chloride SA (KLOR-CON M20) 20 MEQ tablet Take 1 tablet (20 mEq total) by mouth daily.  30 tablet  6  . simvastatin (ZOCOR) 20 MG tablet TAKE 1 TABLET BY MOUTH ONCE A DAY  30 tablet  10  . testosterone cypionate (DEPOTESTOTERONE CYPIONATE) 200 MG/ML injection Inject into the muscle every 28 days.        Marland Kitchen DISCONTD: amLODipine (NORVASC) 5 MG tablet TAKE 1 TABLET BY MOUTH ONCE A DAY  90 tablet  3  . DISCONTD: furosemide (LASIX) 40 MG tablet Take one twice a day for 2 days, then decrease to one in the morning and one-half in the afternoon around 4PM  50  tablet  6  . amLODipine (NORVASC) 10 MG tablet Take 1 tablet (10 mg total) by mouth daily.  30 tablet  6  . furosemide (LASIX) 40 MG tablet Take 1 tablet (40 mg total) by mouth 2 (two) times daily.  60 tablet  6    BP 160/82  Pulse 69  Ht 5\' 10"  (1.778 m)  Wt 209 lb 12.8 oz (95.165 kg)  BMI 30.10 kg/m2  SpO2 96% General: NAD Neck: JVP 10-12 cm, no thyromegaly or thyroid nodule.  Lungs: Clear to auscultation bilaterally with normal respiratory effort. CV: Nondisplaced PMI.  Heart regular S1/S2, no S3/S4, no murmur.  2+ edema 1/2 to knees bilaterally.  No carotid bruit.  Difficult to feel pedal pulses given edema.   Abdomen: Soft, nontender, no hepatosplenomegaly, no distention.  Neurologic: Alert and oriented x 3.  Psych: Normal affect. Extremities: No clubbing or cyanosis.

## 2011-02-22 NOTE — Assessment & Plan Note (Signed)
BP is too high.  Increase amlodipine to 10 mg daily.   Patient needs to be more active.  He will try to walk on his treadmill at home at least 4-5 times a week.

## 2011-02-23 LAB — BASIC METABOLIC PANEL
BUN: 28 mg/dL — ABNORMAL HIGH (ref 6–23)
Creatinine, Ser: 1.8 mg/dL — ABNORMAL HIGH (ref 0.4–1.5)
GFR: 39.05 mL/min — ABNORMAL LOW (ref >60.00)
Glucose, Bld: 107 mg/dL — ABNORMAL HIGH (ref 70–99)
Potassium: 4.5 mEq/L (ref 3.5–5.1)

## 2011-02-23 LAB — BRAIN NATRIURETIC PEPTIDE: Pro B Natriuretic peptide (BNP): 364 pg/mL — ABNORMAL HIGH (ref 0.0–100.0)

## 2011-03-08 ENCOUNTER — Other Ambulatory Visit: Payer: Medicare Other | Admitting: *Deleted

## 2011-03-20 ENCOUNTER — Ambulatory Visit (INDEPENDENT_AMBULATORY_CARE_PROVIDER_SITE_OTHER): Payer: Medicare Other | Admitting: Internal Medicine

## 2011-03-20 ENCOUNTER — Encounter: Payer: Self-pay | Admitting: Internal Medicine

## 2011-03-20 VITALS — BP 124/74 | HR 72 | Temp 98.0°F | Wt 215.0 lb

## 2011-03-20 DIAGNOSIS — G250 Essential tremor: Secondary | ICD-10-CM

## 2011-03-20 DIAGNOSIS — E291 Testicular hypofunction: Secondary | ICD-10-CM

## 2011-03-20 MED ORDER — TESTOSTERONE CYPIONATE 200 MG/ML IM SOLN
200.0000 mg | Freq: Once | INTRAMUSCULAR | Status: AC
  Administered 2011-03-20: 200 mg via INTRAMUSCULAR

## 2011-03-20 NOTE — Progress Notes (Signed)
Patient ID: Harold [name], male   DOB: 07/14/1928, 77 y.o.   MRN: [id number]   HPI:  Comes in for evaluation of 6 months + of right arm shaking. Most noticeable when he tries to perform fine motor activities.  Scalp wound---finally healed  Past Medical History  Diagnosis Date  . CAD (coronary artery disease)     no known MImultiple PCIsall vein grafts occluded, LIMA patent by cath 08/2008  . FUO (fever of unknown origin) 1994    resolved---unclear etiology  . Gout   . Macular degeneration     follow[facility] at [facility]  . PVD (peripheral vascular disease)   . OSA (obstructive sleep apnea)     using CPAP  . Diastolic heart failure     ef 40% echo 08/2008  . Hypertension   . Hyperlipidemia   . Renal insufficiency   . Depression     symptoms  . CKD (chronic kidney disease), stage II     with a glomerular fitration rate of 37 at discharge   . Renal adenoma     hx  . Nephrolithiasis     hx   . Hx of skin cancer, basal cell   . History of echocardiogram 5/10    EF 55%  . AAA (abdominal aortic aneurysm) 2002    3.2 cm, with repair  . History of tobacco use     Remote (same for alcohol use)    History   Social History  . Marital Status: Married    Spouse Name: N/A    Number of Children: N/A  . Years of Education: N/A   Occupational History  . Owner of used car lot     Retired   Social History Main Topics  . Smoking status: Former Smoker -- 2.0 packs/day for 52 years    Quit date: 04/30/1988  . Smokeless tobacco: Not on file   Comment: smoked x52 years; up to 2 ppd   . Alcohol Use: Not on file  . Drug Use: Not on file  . Sexually Active: Not on file   Other Topics Concern  . 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 Abdominal aortic aneurysm repair   . Transurethral resection of prostate   . Coronary artery bypass graft 1990     s/p multiple PCIs with occlusion of al SVGs and only patnet LIMA at cath 5/10   . Cholecystectomy   . Lumbar disc surgery     x5  . Coronary stent placement     multiple; 12/02  . Back surgery   . Prostate surgery     Family History  Problem Relation Age of Onset  . Emphysema Father   . Heart disease Paternal Uncle     x2    No Known Allergies  Current Outpatient Prescriptions on File Prior to Visit  Medication Sig Dispense Refill  . allopurinol (ZYLOPRIM) 100 MG tablet Take 100 mg by mouth daily.        Marland Kitchen ALPRAZolam (XANAX) 0.5 MG tablet Take 1 tablet (0.5 mg total) by mouth 2 (two) times daily as needed.  60 tablet  2  . amLODipine (NORVASC) 10 MG tablet Take 1 tablet (10 mg total) by mouth daily.  30 tablet  6  . aspirin EC 81 MG tablet Take 1 tablet (81 mg total) by  mouth daily.      Marland Kitchen buPROPion (WELLBUTRIN SR) 150 MG 12 hr tablet Take 150 mg by mouth 2 (two) times daily.        . carvedilol (COREG) 12.5 MG tablet Take 1 tablet (12.5 mg total) by mouth 2 (two) times daily.  30 tablet  6  . furosemide (LASIX) 40 MG tablet Take 1 tablet (40 mg total) by mouth 2 (two) times daily.  60 tablet  6  . isosorbide mononitrate (IMDUR) 30 MG 24 hr tablet TAKE 1 TABLET ONCE DAILY  30 tablet  8  . losartan (COZAAR) 50 MG tablet Take 50 mg by mouth daily.        . nitroGLYCERIN (NITROSTAT) 0.4 MG SL tablet Place 0.4 mg under the tongue every 5 (five) minutes as needed.        . polyethylene glycol (GLYCOLAX) packet Take 17 g by mouth daily.        . potassium chloride SA (KLOR-CON M20) 20 MEQ tablet Take 1 tablet (20 mEq total) by mouth daily.  30 tablet  6  . simvastatin (ZOCOR) 20 MG tablet TAKE 1 TABLET BY MOUTH ONCE A DAY  30 tablet  10  . testosterone cypionate (DEPOTESTOTERONE CYPIONATE) 200 MG/ML injection Inject into the muscle every 28 days.           patient denies chest pain, shortness of breath, orthopnea. Denies lower extremity edema, abdominal pain, change in appetite,  change in bowel movements. Patient denies rashes, musculoskeletal complaints. No other specific complaints in a complete review of systems.   BP 124/74  Pulse 72  Temp(Src) 98 F (36.7 C) (Oral)  Wt 215 lb (97.523 kg)

## 2011-03-20 NOTE — Progress Notes (Signed)
Addended by: Alfred Levins D on: 03/20/2011 09:07 AM   Modules accepted: Orders

## 2011-03-20 NOTE — Assessment & Plan Note (Signed)
Will check tsh 

## 2011-04-02 ENCOUNTER — Encounter: Payer: Self-pay | Admitting: Cardiology

## 2011-04-02 ENCOUNTER — Ambulatory Visit (INDEPENDENT_AMBULATORY_CARE_PROVIDER_SITE_OTHER): Payer: Medicare Other | Admitting: Cardiology

## 2011-04-02 DIAGNOSIS — R0602 Shortness of breath: Secondary | ICD-10-CM

## 2011-04-02 DIAGNOSIS — N259 Disorder resulting from impaired renal tubular function, unspecified: Secondary | ICD-10-CM

## 2011-04-02 DIAGNOSIS — I5032 Chronic diastolic (congestive) heart failure: Secondary | ICD-10-CM

## 2011-04-02 DIAGNOSIS — I2510 Atherosclerotic heart disease of native coronary artery without angina pectoris: Secondary | ICD-10-CM

## 2011-04-02 DIAGNOSIS — G4730 Sleep apnea, unspecified: Secondary | ICD-10-CM

## 2011-04-02 DIAGNOSIS — I509 Heart failure, unspecified: Secondary | ICD-10-CM

## 2011-04-02 DIAGNOSIS — I25810 Atherosclerosis of coronary artery bypass graft(s) without angina pectoris: Secondary | ICD-10-CM

## 2011-04-02 LAB — LIPID PANEL
Cholesterol: 130 mg/dL (ref 0–200)
HDL: 32.5 mg/dL — ABNORMAL LOW (ref >39.00)
VLDL: 31.2 mg/dL (ref 0.0–40.0)

## 2011-04-02 LAB — BASIC METABOLIC PANEL
Calcium: 9 mg/dL (ref 8.4–10.5)
GFR: 37.35 mL/min — ABNORMAL LOW (ref >60.00)
Potassium: 4.1 mEq/L (ref 3.5–5.1)
Sodium: 143 mEq/L (ref 135–145)

## 2011-04-02 MED ORDER — FUROSEMIDE 40 MG PO TABS: ORAL_TABLET | ORAL | Status: DC

## 2011-04-02 NOTE — Patient Instructions (Signed)
Increase lasix(furosemide) to 60mg  twice a day. This will be one and one-half 40mg  tablets twice a day.  Your physician recommends that you have  lab work today--Lipid profile/BMET/BNP  414.05  428.32   Your physician recommends that you return for lab work in: about 10 days--BMET/BNP 414.05  428.32  Your physician recommends that you schedule a follow-up appointment in: 2 months with Dr Shirlee Latch.

## 2011-04-03 ENCOUNTER — Telehealth: Payer: Self-pay | Admitting: Cardiology

## 2011-04-03 ENCOUNTER — Telehealth: Payer: Self-pay | Admitting: Internal Medicine

## 2011-04-03 NOTE — Assessment & Plan Note (Signed)
CKD will make diuresis more difficult.  Will need to follow closely.

## 2011-04-03 NOTE — Telephone Encounter (Signed)
New message:  Wife needs to speak to you regarding his CPAP,  Please call her back.

## 2011-04-03 NOTE — Telephone Encounter (Signed)
I talked with pt's wife. Pt needs new CPAP equipment. Wife states Dr Cato Mulligan ordered CPAP for pt several years ago--?2003. She will follow-up with Dr Cato Mulligan about new order for CPAP.

## 2011-04-03 NOTE — Progress Notes (Signed)
PCP: Dr. Cato Mulligan  77 yo with history of CAD s/p CABG and diastolic CHF presents to cardiology clinic for followup.   He had CABG in 1998.  Last cath in 2010 showed all grafts occluded except LIMA-LAD.  He was not a candidate for intervention or redo CABG.  He has significant diastolic CHF.  Unfortunately, his weight is back up again (6 lbs). He is still short of breath walking up an incline or up steps or after walking about 100 feet on flat ground.  He continues to have lower extremity edema.  No chest pain. He is not very active.   BP is better today.   Labs (6/12): K 3.9, creatinine 1.5, LDL 82, HDL 33, LFTs normal Labs (1/61): K 3.8, creatinine 2.2 => 1.9 Labs (9/12): K 4.1, creatinine 1.6, BNP 447 Labs (10/12): K 4.5, creatinine 1.8, BNP 364  PMH: 1. CAD: CABG in 1998.  Last cath in 2010 showed all native vessels occluded and all SVGs occluded, LIMA-LAD was patent with good collaterals to other circulations.  No interventional options and was not a good candidate for redo surgery.  2. Macular degeneration.  3. Gout 4. AAA s/p repair 5. PAD 6. OSA: CPAP 7. Diastolic CHF: Echo (5/10) with EF 55%, inferior hypokinesis, grade III diastolic dysfunction (restrictive), no significant valvular dysfunction.  8. 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  Nonsmoker.    FH: Noncontributory.   ROS: All systems reviewed and negative except as per HPI.   Current Outpatient Prescriptions  Medication Sig Dispense Refill  . allopurinol (ZYLOPRIM) 100 MG tablet Take 100 mg by mouth daily.        Marland Kitchen ALPRAZolam (XANAX) 0.5 MG tablet Take 1 tablet (0.5 mg total) by mouth 2 (two) times daily as needed.  60 tablet  2  . amLODipine (NORVASC) 10 MG tablet Take 1 tablet (10 mg total) by mouth daily.  30 tablet  6  . aspirin EC 81 MG tablet Take 1 tablet (81 mg total) by mouth daily.      Marland Kitchen buPROPion (WELLBUTRIN SR) 150 MG 12 hr tablet Take 150 mg by mouth 2  (two) times daily.        . carvedilol (COREG) 12.5 MG tablet Take 1 tablet (12.5 mg total) by mouth 2 (two) times daily.  30 tablet  6  . isosorbide mononitrate (IMDUR) 30 MG 24 hr tablet TAKE 1 TABLET ONCE DAILY  30 tablet  8  . losartan (COZAAR) 50 MG tablet Take 50 mg by mouth daily.        . nitroGLYCERIN (NITROSTAT) 0.4 MG SL tablet Place 0.4 mg under the tongue every 5 (five) minutes as needed.        . polyethylene glycol (GLYCOLAX) packet Take 17 g by mouth daily.        . potassium chloride SA (KLOR-CON M20) 20 MEQ tablet Take 1 tablet (20 mEq total) by mouth daily.  30 tablet  6  . simvastatin (ZOCOR) 20 MG tablet TAKE 1 TABLET BY MOUTH ONCE A DAY  30 tablet  10  . testosterone cypionate (DEPOTESTOTERONE CYPIONATE) 200 MG/ML injection Inject into the muscle every 28 days.        . furosemide (LASIX) 40 MG tablet Take one and one-half tablets twice a day  180 tablet  6    BP 134/70  Pulse 77  Ht 5' 10.5" (1.791 m)  Wt 97.523 kg (215 lb)  BMI 30.41 kg/m2 General: NAD Neck: JVP 8-9 cm, no thyromegaly or thyroid nodule.  Lungs: Clear to auscultation bilaterally with normal respiratory effort. CV: Nondisplaced PMI.  Heart regular S1/S2, no S3/S4, 1/6 SEM.  1+ edema to knees bilaterally.  No carotid bruit.  Difficult to feel pedal pulses given edema.   Abdomen: Soft, nontender, no hepatosplenomegaly, no distention.  Neurologic: Alert and oriented x 3.  Psych: Normal affect. Extremities: No clubbing or cyanosis.

## 2011-04-03 NOTE — Assessment & Plan Note (Signed)
He has been off CPAP.  He will need to eventually restart.

## 2011-04-03 NOTE — Assessment & Plan Note (Signed)
Stable severe CAD.  Native vessels occluded and SVGs occluded. Has only LIMA-LAD with good collaterals to other circulations.  He is not a candidate for intervention or redo CABG.  He denies exertional chest pain. Continue ASA 81 mg, Coreg, Imdur, ARB, statin.  

## 2011-04-03 NOTE — Assessment & Plan Note (Signed)
Harold [name] remains volume overloaded with NYHA class III symptoms.   He needs to watch his salt intake closely.  He will increase Lasix to 40 mg bid.  BMET/BNP today and in 10 days.

## 2011-04-03 NOTE — Telephone Encounter (Signed)
Need to get a letter from Dr Cato Mulligan, stating that pt has sleep apnea and needs a new machine. Pt old machine is not working properly and pt needs an Pharmacologist. This needs to be done asap.

## 2011-04-04 ENCOUNTER — Telehealth: Payer: Self-pay | Admitting: Internal Medicine

## 2011-04-04 NOTE — Telephone Encounter (Signed)
Checking on status of letter about his Cpap.

## 2011-04-05 NOTE — Telephone Encounter (Signed)
If this ok I will type it up for you

## 2011-04-06 NOTE — Telephone Encounter (Signed)
Pts spouse called to check on status of getting letter about pts Cpap. Pls call when ready for pick up.

## 2011-04-06 NOTE — Telephone Encounter (Signed)
ok 

## 2011-04-06 NOTE — Telephone Encounter (Signed)
Letter ready for p/u, pt aware

## 2011-04-09 ENCOUNTER — Telehealth: Payer: Self-pay

## 2011-04-09 ENCOUNTER — Encounter: Payer: Self-pay | Admitting: *Deleted

## 2011-04-09 NOTE — Telephone Encounter (Signed)
Pt called back to check on status of having letter sent to [facility]. Pls call pt when letter has been sent.

## 2011-04-09 NOTE — Telephone Encounter (Signed)
Note corrected and faxed to [facility], pt aware

## 2011-04-10 NOTE — Telephone Encounter (Signed)
Wife called back and was told by [facility] that they did received the letter, but it did not state the directions, the medicine or any other info about this new cpap machine. They told her that the patient can have a face to face with Dr Cato Mulligan. I offered Harold [name] an opening for next week with Dr Cato Mulligan, but she refused. She stated that she would like this matter taken care of this week by Friday. Please advise. Thanks.

## 2011-04-12 NOTE — Telephone Encounter (Signed)
Pt wife is aware nurse will call [facility]

## 2011-04-12 NOTE — Telephone Encounter (Signed)
Call [facility] and ask if they have what they need

## 2011-04-12 NOTE — Telephone Encounter (Signed)
Betsy with [facility] called and said that pt needs a replacement cpap, but in order for pt to get a cpap, the pt needs an actual ov with pcp. A letter from pcp is not able to be accepted.

## 2011-04-13 NOTE — Telephone Encounter (Signed)
Pt has already p/u CPAP machine yesterday

## 2011-04-30 ENCOUNTER — Other Ambulatory Visit: Payer: Self-pay | Admitting: Internal Medicine

## 2011-05-02 ENCOUNTER — Other Ambulatory Visit: Payer: Self-pay | Admitting: *Deleted

## 2011-05-02 DIAGNOSIS — F419 Anxiety disorder, unspecified: Secondary | ICD-10-CM

## 2011-05-02 MED ORDER — ALPRAZOLAM 0.5 MG PO TABS: 0.5000 mg | ORAL_TABLET | Freq: Two times a day (BID) | ORAL | Status: DC | PRN

## 2011-05-07 ENCOUNTER — Other Ambulatory Visit: Payer: Self-pay | Admitting: Internal Medicine

## 2011-06-04 ENCOUNTER — Encounter: Payer: Self-pay | Admitting: Cardiology

## 2011-06-04 ENCOUNTER — Ambulatory Visit (INDEPENDENT_AMBULATORY_CARE_PROVIDER_SITE_OTHER): Payer: Medicare Other | Admitting: Cardiology

## 2011-06-04 DIAGNOSIS — R0602 Shortness of breath: Secondary | ICD-10-CM

## 2011-06-04 DIAGNOSIS — I498 Other specified cardiac arrhythmias: Secondary | ICD-10-CM

## 2011-06-04 DIAGNOSIS — I509 Heart failure, unspecified: Secondary | ICD-10-CM

## 2011-06-04 DIAGNOSIS — E785 Hyperlipidemia, unspecified: Secondary | ICD-10-CM

## 2011-06-04 DIAGNOSIS — N259 Disorder resulting from impaired renal tubular function, unspecified: Secondary | ICD-10-CM

## 2011-06-04 DIAGNOSIS — I2510 Atherosclerotic heart disease of native coronary artery without angina pectoris: Secondary | ICD-10-CM

## 2011-06-04 DIAGNOSIS — I5032 Chronic diastolic (congestive) heart failure: Secondary | ICD-10-CM

## 2011-06-04 MED ORDER — FUROSEMIDE 80 MG PO TABS: 80.0000 mg | ORAL_TABLET | Freq: Two times a day (BID) | ORAL | Status: DC

## 2011-06-04 NOTE — Assessment & Plan Note (Signed)
Stable severe CAD.  Native vessels occluded and SVGs occluded. Has only LIMA-LAD with good collaterals to other circulations.  He is not a candidate for intervention or redo CABG.  He denies exertional chest pain. Continue ASA 81 mg, Coreg, Imdur, ARB, statin.  

## 2011-06-04 NOTE — Assessment & Plan Note (Signed)
Improving but still volume overloaded.  Increase Lasix to 80 mg bid.  BMET/BNP in 1 week.  I will also get an echo to make sure that LV systolic function remains normal.

## 2011-06-04 NOTE — Patient Instructions (Signed)
Increase lasix(furosemide) to 80mg  twice a day.  Your physician has requested that you have an echocardiogram. Echocardiography is a painless test that uses sound waves to create images of your heart. It provides your doctor with information about the size and shape of your heart and how well your heart's chambers and valves are working. This procedure takes approximately one hour. There are no restrictions for this procedure.  Your physician recommends that you return for lab work in: 1 week--BMET/BNP 427.89  414.01   Your physician recommends that you schedule a follow-up appointment in: 2 months with Dr Shirlee Latch.

## 2011-06-04 NOTE — Assessment & Plan Note (Signed)
LDL at goal in 12/12.

## 2011-06-04 NOTE — Assessment & Plan Note (Signed)
Monitor creatinine closely while diuresing.

## 2011-06-04 NOTE — Progress Notes (Signed)
PCP: Dr. Cato Mulligan  78 yo with history of CAD s/p CABG and diastolic CHF presents to cardiology clinic for followup.   He had CABG in 1998.  Last cath in 2010 showed all grafts occluded except LIMA-LAD.  He was not a candidate for intervention or redo CABG.  He has significant diastolic CHF.  At last appointment, I increased his Lasix to 60 mg bid.  He has lost 5 lbs.  He is still short of breath after walking 100-200 feet and has to stop.  No orthopnea or PND.  No chest pain.  He is not very active.  He still has significant lower extremity edema.    Labs (6/12): K 3.9, creatinine 1.5, LDL 82, HDL 33, LFTs normal Labs (1/61): K 3.8, creatinine 2.2 => 1.9 Labs (9/12): K 4.1, creatinine 1.6, BNP 447 Labs (10/12): K 4.5, creatinine 1.8, BNP 364 Labs (12/12): BNP 206, K 4.1, creatinine 1.9, LDL 66, HDL 33  PMH: 1. CAD: CABG in 1998.  Last cath in 2010 showed all native vessels occluded and all SVGs occluded, LIMA-LAD was patent with good collaterals to other circulations.  No interventional options and was not a good candidate for redo surgery.  2. Macular degeneration.  3. Gout 4. AAA s/p repair 5. PAD 6. OSA: CPAP 7. Diastolic CHF: Echo (5/10) with EF 55%, inferior hypokinesis, grade III diastolic dysfunction (restrictive), no significant valvular dysfunction.  8. 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  Nonsmoker.    FH: Noncontributory.   ROS: All systems reviewed and negative except as per HPI.   Current Outpatient Prescriptions  Medication Sig Dispense Refill  . allopurinol (ZYLOPRIM) 100 MG tablet Take 100 mg by mouth daily.        Marland Kitchen ALPRAZolam (XANAX) 0.5 MG tablet Take 1 tablet (0.5 mg total) by mouth 2 (two) times daily as needed.  60 tablet  2  . amLODipine (NORVASC) 10 MG tablet Take 1 tablet (10 mg total) by mouth daily.  30 tablet  6  . aspirin EC 81 MG tablet Take 1 tablet (81 mg total) by mouth daily.      Marland Kitchen buPROPion  (WELLBUTRIN XL) 150 MG 24 hr tablet TAKE 1 TABLET BY MOUTH TWICE A DAY  60 tablet  10  . carvedilol (COREG) 12.5 MG tablet Take 1 tablet (12.5 mg total) by mouth 2 (two) times daily.  30 tablet  6  . isosorbide mononitrate (IMDUR) 30 MG 24 hr tablet TAKE 1 TABLET ONCE DAILY  30 tablet  8  . losartan (COZAAR) 50 MG tablet Take 50 mg by mouth daily.        . nitroGLYCERIN (NITROSTAT) 0.4 MG SL tablet Place 0.4 mg under the tongue every 5 (five) minutes as needed.        . polyethylene glycol (GLYCOLAX) packet Take 17 g by mouth daily.        . potassium chloride SA (KLOR-CON M20) 20 MEQ tablet Take 1 tablet (20 mEq total) by mouth daily.  30 tablet  6  . simvastatin (ZOCOR) 20 MG tablet TAKE 1 TABLET BY MOUTH ONCE A DAY  30 tablet  10  . testosterone cypionate (DEPOTESTOTERONE CYPIONATE) 200 MG/ML injection Inject into the muscle every 28 days.        . furosemide (LASIX) 80 MG tablet Take 1 tablet (80 mg total) by mouth 2 (two) times daily.  60 tablet  6  . DISCONTD: furosemide (  LASIX) 40 MG tablet Take one and one-half tablets twice a day  180 tablet  6    BP 142/76  Pulse 73  Ht 5' 10.5" (1.791 m)  Wt 95.437 kg (210 lb 6.4 oz)  BMI 29.76 kg/m2  SpO2 96% General: NAD Neck: JVP 8 cm with HJR, no thyromegaly or thyroid nodule.  Lungs: Clear to auscultation bilaterally with normal respiratory effort. CV: Nondisplaced PMI.  Heart regular S1/S2, no S3/S4, 1/6 SEM.  2+ edema e/2 way to knees bilaterally.  No carotid bruit.  Difficult to feel pedal pulses given edema.   Abdomen: Soft, nontender, no hepatosplenomegaly, no distention.  Neurologic: Alert and oriented x 3.  Psych: Normal affect. Extremities: No clubbing or cyanosis.

## 2011-06-11 ENCOUNTER — Other Ambulatory Visit (INDEPENDENT_AMBULATORY_CARE_PROVIDER_SITE_OTHER): Payer: Medicare Other | Admitting: *Deleted

## 2011-06-11 ENCOUNTER — Other Ambulatory Visit: Payer: Self-pay

## 2011-06-11 ENCOUNTER — Ambulatory Visit (HOSPITAL_COMMUNITY): Payer: Medicare Other | Attending: Cardiology | Admitting: Radiology

## 2011-06-11 DIAGNOSIS — E785 Hyperlipidemia, unspecified: Secondary | ICD-10-CM | POA: Insufficient documentation

## 2011-06-11 DIAGNOSIS — I714 Abdominal aortic aneurysm, without rupture, unspecified: Secondary | ICD-10-CM | POA: Insufficient documentation

## 2011-06-11 DIAGNOSIS — I25810 Atherosclerosis of coronary artery bypass graft(s) without angina pectoris: Secondary | ICD-10-CM

## 2011-06-11 DIAGNOSIS — I498 Other specified cardiac arrhythmias: Secondary | ICD-10-CM

## 2011-06-11 DIAGNOSIS — R0602 Shortness of breath: Secondary | ICD-10-CM

## 2011-06-11 DIAGNOSIS — I2510 Atherosclerotic heart disease of native coronary artery without angina pectoris: Secondary | ICD-10-CM

## 2011-06-11 DIAGNOSIS — I509 Heart failure, unspecified: Secondary | ICD-10-CM | POA: Insufficient documentation

## 2011-06-11 LAB — BASIC METABOLIC PANEL
BUN: 26 mg/dL — ABNORMAL HIGH (ref 6–23)
CO2: 32 mEq/L (ref 19–32)
Calcium: 9.1 mg/dL (ref 8.4–10.5)
Glucose, Bld: 91 mg/dL (ref 70–99)
Sodium: 140 mEq/L (ref 135–145)

## 2011-06-19 ENCOUNTER — Other Ambulatory Visit: Payer: Self-pay | Admitting: *Deleted

## 2011-06-19 DIAGNOSIS — I5032 Chronic diastolic (congestive) heart failure: Secondary | ICD-10-CM

## 2011-06-19 DIAGNOSIS — I25810 Atherosclerosis of coronary artery bypass graft(s) without angina pectoris: Secondary | ICD-10-CM

## 2011-06-19 MED ORDER — CARVEDILOL 12.5 MG PO TABS: 12.5000 mg | ORAL_TABLET | Freq: Two times a day (BID) | ORAL | Status: DC

## 2011-06-19 MED ORDER — LOSARTAN POTASSIUM 50 MG PO TABS: 50.0000 mg | ORAL_TABLET | Freq: Every day | ORAL | Status: DC

## 2011-07-03 ENCOUNTER — Other Ambulatory Visit: Payer: Self-pay | Admitting: *Deleted

## 2011-07-03 MED ORDER — TESTOSTERONE CYPIONATE 200 MG/ML IM SOLN: 200.0000 mg | INTRAMUSCULAR | Status: DC

## 2011-07-05 ENCOUNTER — Telehealth: Payer: Self-pay | Admitting: Internal Medicine

## 2011-07-05 NOTE — Telephone Encounter (Signed)
Patient spouse called stating she can not find the rx for testosterone and she would like to pick up a reprint in the morning. Patient would liked to be called when it is available.

## 2011-07-06 NOTE — Telephone Encounter (Signed)
rx called in, pt aware 

## 2011-07-25 IMAGING — CT CT HEAD W/O CM
1 series · 16 of 30 positions shown, 20 images · non-contrast
Comparison: 03/13/2010

CLINICAL DATA: Fall.  Head laceration.

CT HEAD WITHOUT CONTRAST
TECHNIQUE: Contiguous axial images were obtained from the base of
the skull through the vertex without contrast.

[Series 2: headseq 4.8 h45s · axial · 0.40mm/px · z∈[-273,-121]mm · 16 of 36 slices shown, 20 images]
[im 2/36  brain]
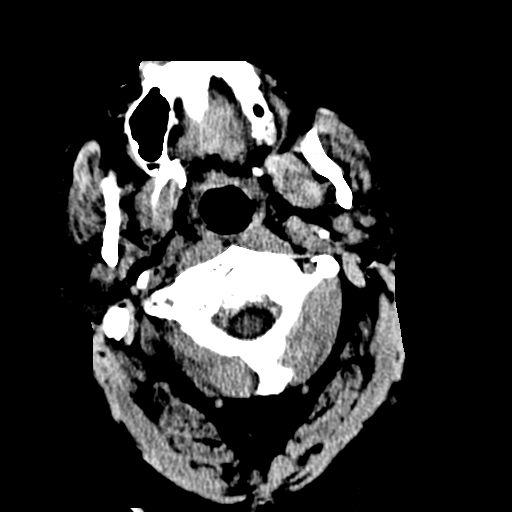
[im 2/36  bone]
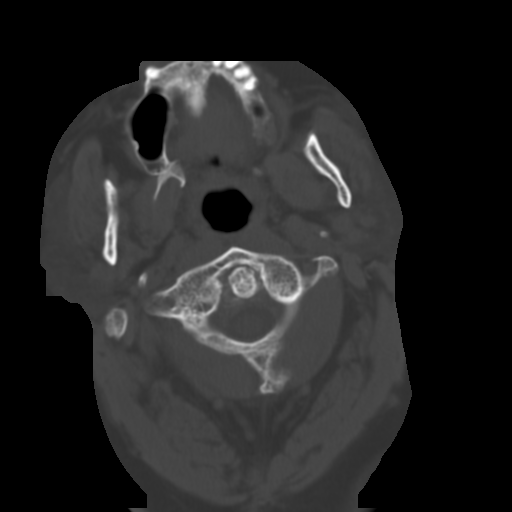
[im 4/36  brain]
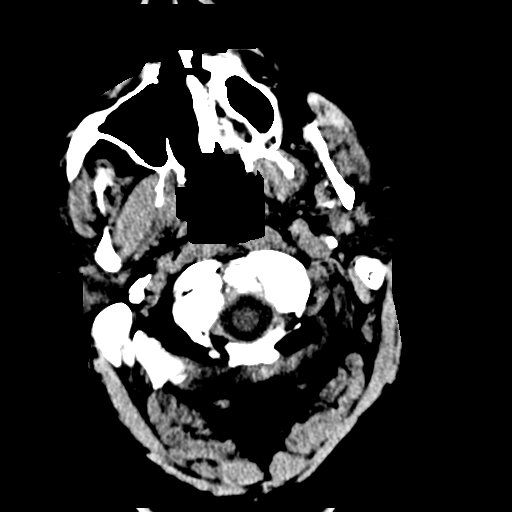
[im 7/36  brain]
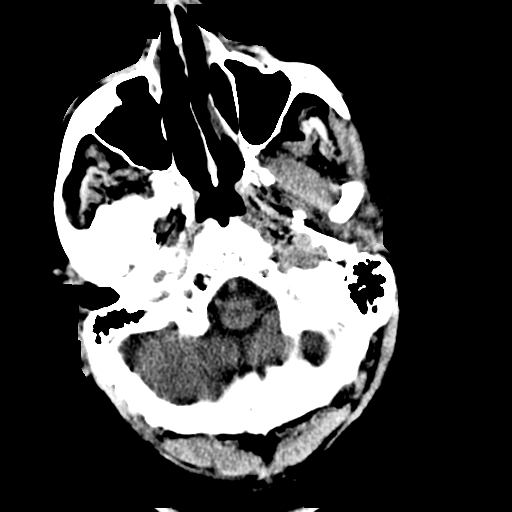
[im 9/36  brain]
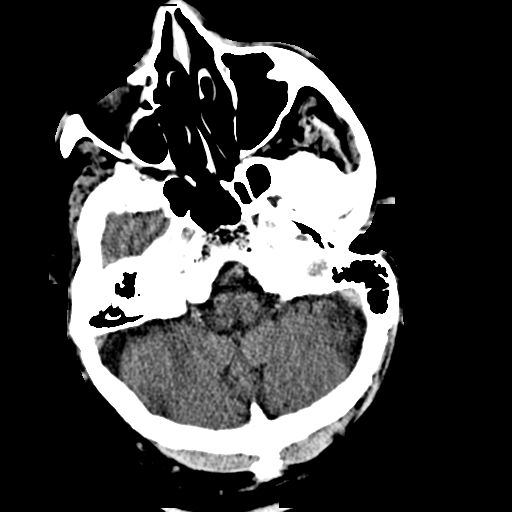
[im 10/36  brain]
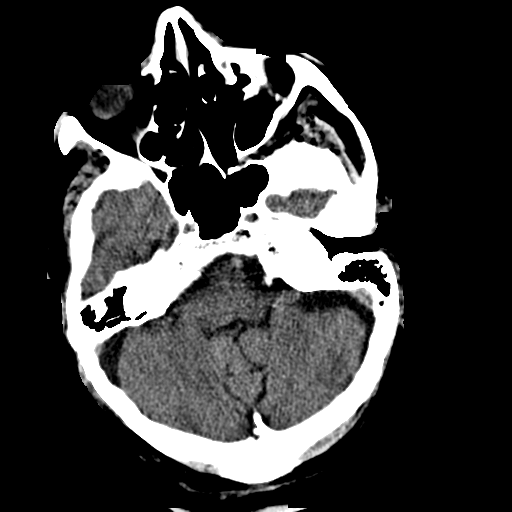
[im 10/36  bone]
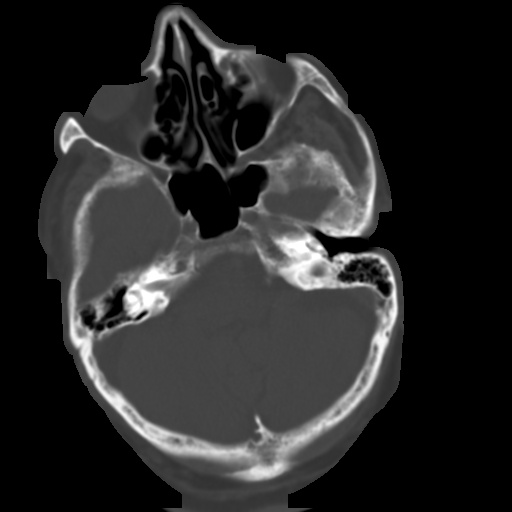
[im 13/36  brain]
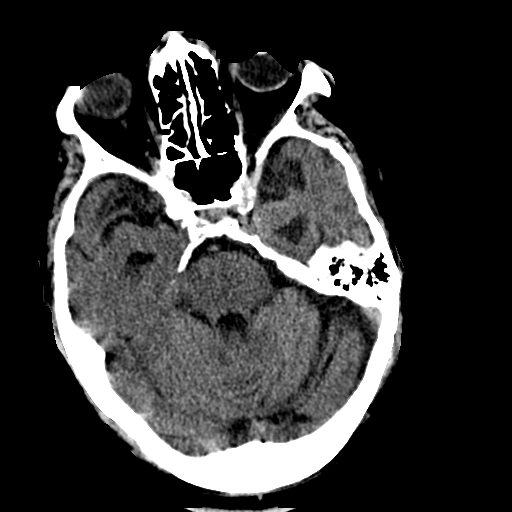
[im 15/36  brain]
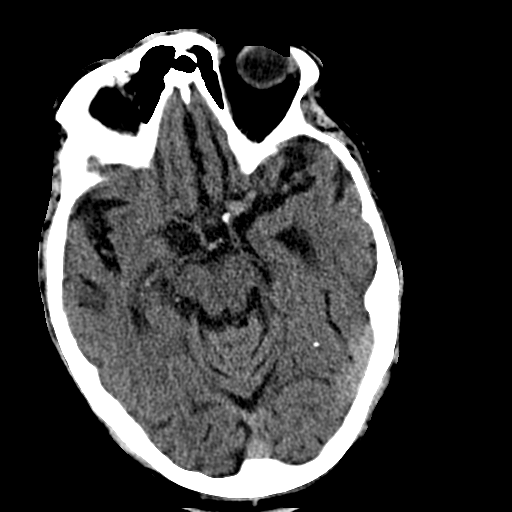
[im 17/36  brain]
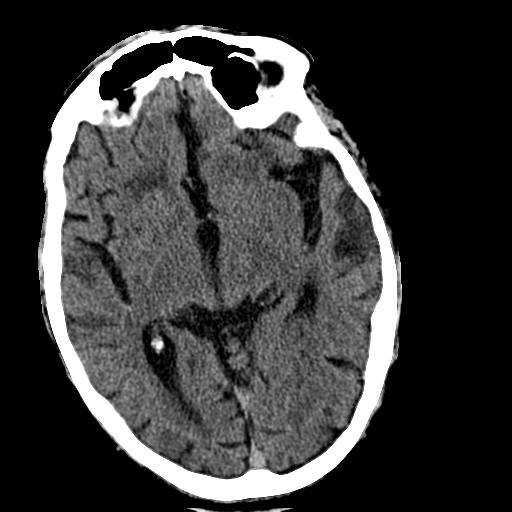
[im 19/36  brain]
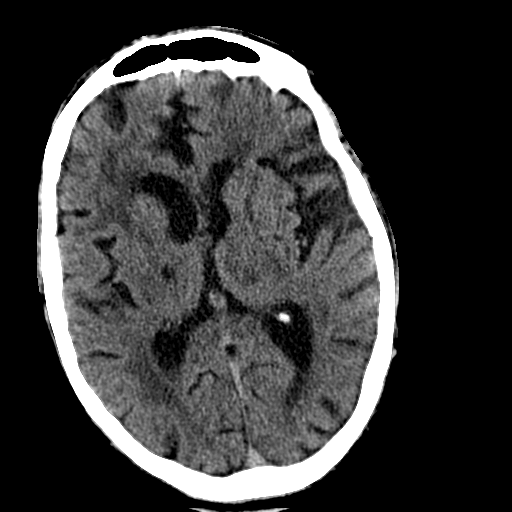
[im 19/36  bone]
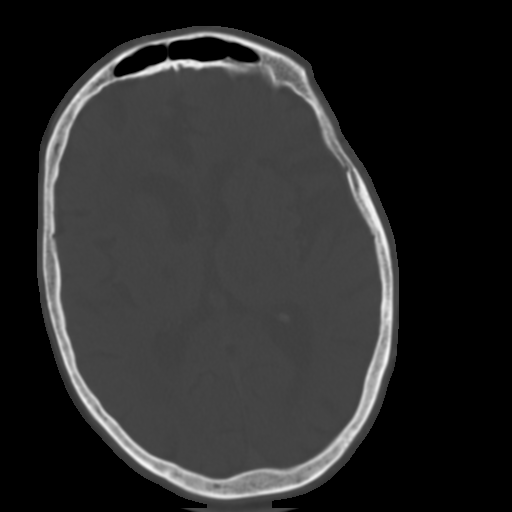
[im 21/36  brain]
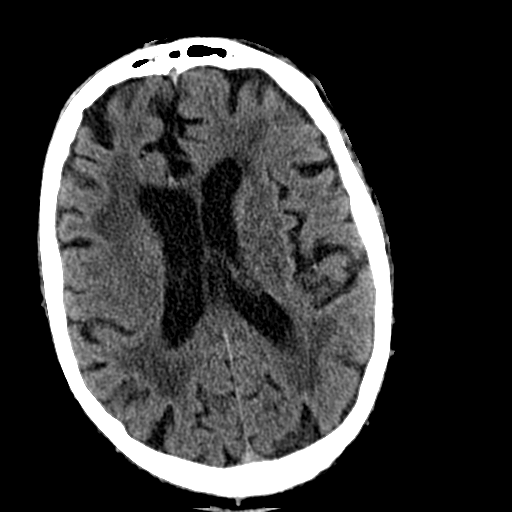
[im 23/36  brain]
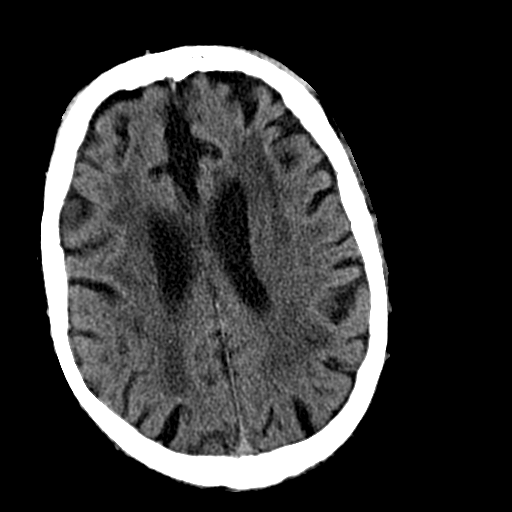
[im 26/36  brain]
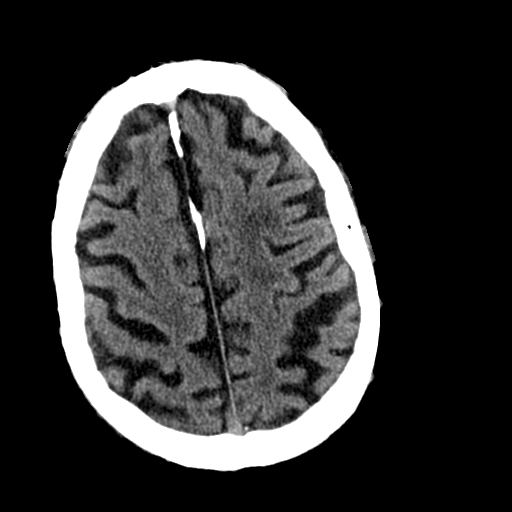
[im 27/36  brain]
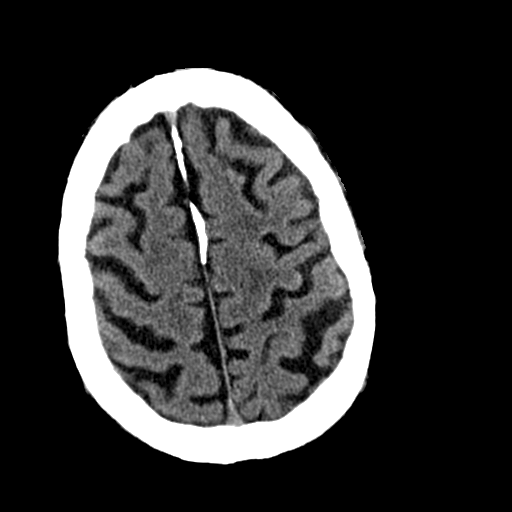
[im 27/36  bone]
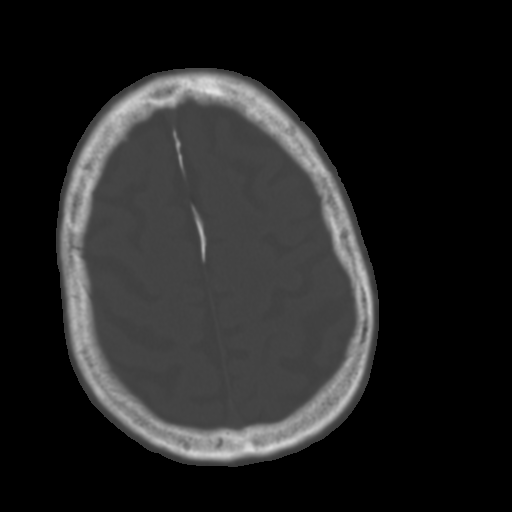
[im 29/36  brain]
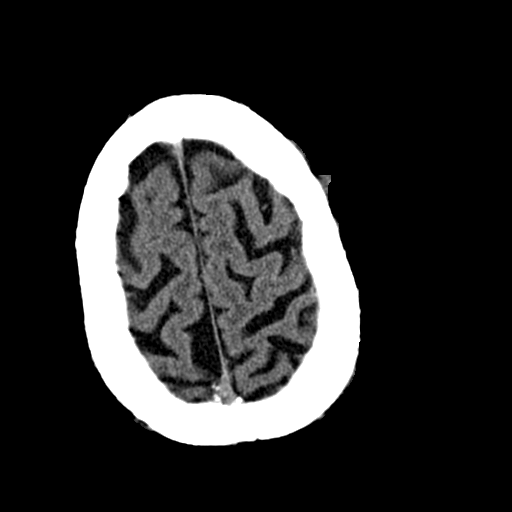
[im 32/36  brain]
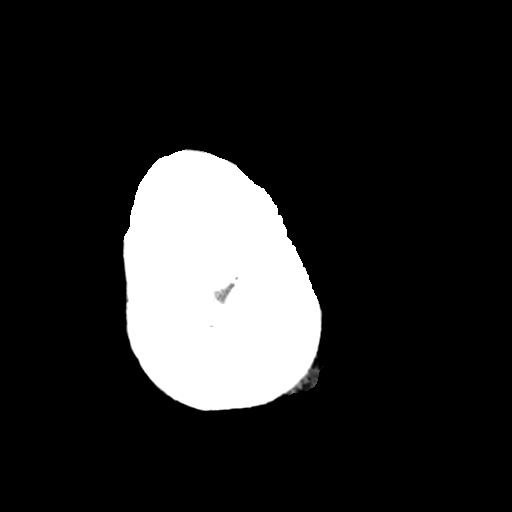
[im 34/36  brain]
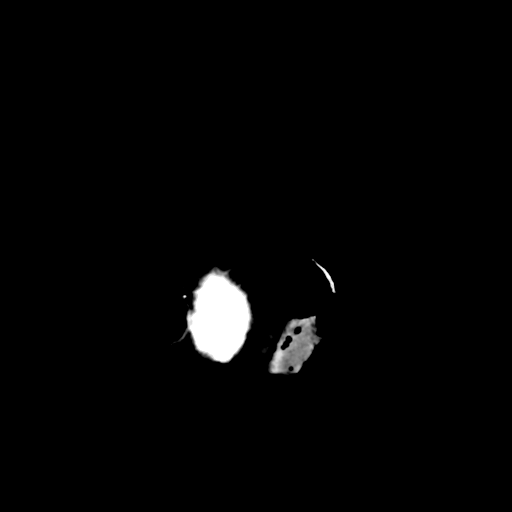

[16 of 30 positions shown; findings below may reference images not displayed]

FINDINGS: There is diffuse patchy low density throughout the subcortical and
periventricular white matter consistent with chronic small vessel
ischemic change.

There is prominence of the sulci and ventricles consistent with
brain atrophy.

There is no evidence for acute brain infarct, hemorrhage or mass.

The paranasal sinuses and the mastoid air cells appear clear.

The skull appears intact.

There is a left frontal scalp hematoma which measures 1.19 x
cm.  There is a left posterior parietal scalp hematoma which
measures 3.5 x 1.4 cm.
IMPRESSION: 1.  Small vessel ischemic disease and brain atrophy.
2.  No acute intracranial abnormalities.
3.  Left frontal and left parietal scalp hematomas.

## 2011-08-07 ENCOUNTER — Encounter: Payer: Self-pay | Admitting: Internal Medicine

## 2011-08-09 ENCOUNTER — Telehealth: Payer: Self-pay | Admitting: Cardiology

## 2011-08-09 NOTE — Telephone Encounter (Signed)
Spoke with pt's wife. She did not realize pt has an appt  08/13/11 with Dr Shirlee Latch. She will bring all his medication bottles to the appointment and we will go over them then. She did not need anything today.

## 2011-08-09 NOTE — Telephone Encounter (Signed)
New Problem:     Patient's wife called wanting help with all of her husbands medications.  Please call back.

## 2011-08-13 ENCOUNTER — Encounter: Payer: Self-pay | Admitting: Cardiology

## 2011-08-13 ENCOUNTER — Ambulatory Visit (INDEPENDENT_AMBULATORY_CARE_PROVIDER_SITE_OTHER): Payer: Medicare Other | Admitting: Cardiology

## 2011-08-13 VITALS — BP 168/85 | HR 81 | Wt 205.1 lb

## 2011-08-13 DIAGNOSIS — N259 Disorder resulting from impaired renal tubular function, unspecified: Secondary | ICD-10-CM

## 2011-08-13 DIAGNOSIS — I10 Essential (primary) hypertension: Secondary | ICD-10-CM

## 2011-08-13 DIAGNOSIS — I2510 Atherosclerotic heart disease of native coronary artery without angina pectoris: Secondary | ICD-10-CM

## 2011-08-13 DIAGNOSIS — I5032 Chronic diastolic (congestive) heart failure: Secondary | ICD-10-CM

## 2011-08-13 DIAGNOSIS — R0602 Shortness of breath: Secondary | ICD-10-CM

## 2011-08-13 DIAGNOSIS — I509 Heart failure, unspecified: Secondary | ICD-10-CM

## 2011-08-13 LAB — BRAIN NATRIURETIC PEPTIDE: Pro B Natriuretic peptide (BNP): 486 pg/mL — ABNORMAL HIGH (ref 0.0–100.0)

## 2011-08-13 LAB — BASIC METABOLIC PANEL
CO2: 25 mEq/L (ref 19–32)
Chloride: 103 mEq/L (ref 96–112)
Creatinine, Ser: 1.7 mg/dL — ABNORMAL HIGH (ref 0.4–1.5)
Sodium: 139 mEq/L (ref 135–145)

## 2011-08-13 MED ORDER — CARVEDILOL 12.5 MG PO TABS: ORAL_TABLET | ORAL | Status: DC

## 2011-08-13 NOTE — Assessment & Plan Note (Signed)
Stable severe CAD.  Native vessels occluded and SVGs occluded. Has only LIMA-LAD with good collaterals to other circulations.  He is not a candidate for intervention or redo CABG.  He denies exertional chest pain. Continue ASA 81 mg, Coreg, Imdur, ARB, statin.  

## 2011-08-13 NOTE — Assessment & Plan Note (Signed)
Repeat BMET.  Creatinine has been stable.

## 2011-08-13 NOTE — Assessment & Plan Note (Signed)
BP running high.  He is on 2 beta blockers (metoprolol and Coreg).  Stop metoprolol.  Increase Coreg to 18.75 mg bid. Continue amlodipine and losartan.

## 2011-08-13 NOTE — Patient Instructions (Signed)
Stop metoprolol.  Increase coreg(carvedilol) to 18.75mg  twice a day. This will be one and one-half 12.5mg  tablets twice a day.  Your physician recommends that you have  lab work today--BMET/BNP 414.01 428.32  Your physician recommends that you schedule a follow-up appointment in: 4 months with Dr Shirlee Latch.

## 2011-08-13 NOTE — Progress Notes (Signed)
PCP: Dr. Cato Mulligan  78 yo with history of CAD s/p CABG and diastolic CHF presents to cardiology clinic for followup.   He had CABG in 1998.  Last cath in 2010 showed all grafts occluded except LIMA-LAD.  He was not a candidate for intervention or redo CABG.  He has significant diastolic CHF, EF 16% with moderate LVH on 2/13 echo.  At last appointment, he was volume overloaded and I increased his Lasix to 80 mg bid.  Since then, he has lost 5 lbs and lower extremity edema has improved.  He is not short of breath walking on flat ground but is not very active either.  No chest pain.  He is having trouble with his new CPAP machine.  BP is running high today at 168/85.    Labs (6/12): K 3.9, creatinine 1.5, LDL 82, HDL 33, LFTs normal Labs (1/09): K 3.8, creatinine 2.2 => 1.9 Labs (9/12): K 4.1, creatinine 1.6, BNP 447 Labs (10/12): K 4.5, creatinine 1.8, BNP 364 Labs (12/12): BNP 206, K 4.1, creatinine 1.9, LDL 66, HDL 33 Labs (2/13): BNP 268, K 3.9, creatinine 1.7  ECG: NSR, 1st degree AV block, old inferior MI  PMH: 1. CAD: CABG in 1998.  Last cath in 2010 showed all native vessels occluded and all SVGs occluded, LIMA-LAD was patent with good collaterals to other circulations.  No interventional options and was not a good candidate for redo surgery.  2. Macular degeneration.  3. Gout 4. AAA s/p repair 5. PAD 6. OSA: CPAP 7. Diastolic CHF: Echo (5/10) with EF 55%, inferior hypokinesis, grade III diastolic dysfunction (restrictive), no significant valvular dysfunction.  Echo (2/13) with EF 55%, moderate LVH, moderate diastolic dysfunciton, normal RV, mild MR.  8. 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  Nonsmoker.    FH: CAD  ROS: All systems reviewed and negative except as per HPI.   Current Outpatient Prescriptions  Medication Sig Dispense Refill  . allopurinol (ZYLOPRIM) 100 MG tablet Take 100 mg by mouth daily.        Marland Kitchen ALPRAZolam  (XANAX) 0.5 MG tablet Take 1 tablet (0.5 mg total) by mouth 2 (two) times daily as needed.  60 tablet  2  . amLODipine (NORVASC) 10 MG tablet Take 1 tablet (10 mg total) by mouth daily.  30 tablet  6  . aspirin EC 81 MG tablet Take 1 tablet (81 mg total) by mouth daily.      Marland Kitchen buPROPion (WELLBUTRIN XL) 150 MG 24 hr tablet TAKE 1 TABLET BY MOUTH TWICE A DAY  60 tablet  10  . furosemide (LASIX) 80 MG tablet Take 1 tablet (80 mg total) by mouth 2 (two) times daily.  60 tablet  6  . isosorbide mononitrate (IMDUR) 30 MG 24 hr tablet TAKE 1 TABLET ONCE DAILY  30 tablet  8  . losartan (COZAAR) 50 MG tablet Take 1 tablet (50 mg total) by mouth daily.  30 tablet  6  . multivitamin-lutein (OCUVITE-LUTEIN) CAPS Take 1 capsule by mouth daily.      . nitroGLYCERIN (NITROSTAT) 0.4 MG SL tablet Place 0.4 mg under the tongue every 5 (five) minutes as needed.        . potassium chloride SA (KLOR-CON M20) 20 MEQ tablet Take 1 tablet (20 mEq total) by mouth daily.  30 tablet  6  . simvastatin (ZOCOR) 20 MG tablet TAKE 1 TABLET BY MOUTH ONCE A DAY  30  tablet  10  . testosterone cypionate (DEPOTESTOTERONE CYPIONATE) 200 MG/ML injection Inject 1 mL (200 mg total) into the muscle every 28 (twenty-eight) days.  10 mL  0  . DISCONTD: carvedilol (COREG) 12.5 MG tablet Take 1 tablet (12.5 mg total) by mouth 2 (two) times daily.  60 tablet  6  . carvedilol (COREG) 12.5 MG tablet Take 1 and 1/2 tablets(total 18.75mg ) twice a day  90 tablet  6    BP 168/85  Pulse 81  Wt 205 lb 1.9 oz (93.042 kg) General: NAD Neck: No JVD, no thyromegaly or thyroid nodule.  Lungs: Clear to auscultation bilaterally with normal respiratory effort. CV: Nondisplaced PMI.  Heart regular S1/S2, no S3/S4, 1/6 SEM.  1+ edema 1/2 way to knees bilaterally.  No carotid bruit.  Difficult to feel pedal pulses given edema.   Abdomen: Soft, nontender, no hepatosplenomegaly, no distention.  Neurologic: Alert and oriented x 3.  Psych: Normal  affect. Extremities: No clubbing or cyanosis.

## 2011-08-13 NOTE — Assessment & Plan Note (Signed)
Improved volume status with NYHA class II-III symptoms.  Continue current Lasix dosing.  BMET/BNP today.

## 2011-08-15 ENCOUNTER — Telehealth: Payer: Self-pay | Admitting: Cardiology

## 2011-08-15 DIAGNOSIS — F419 Anxiety disorder, unspecified: Secondary | ICD-10-CM

## 2011-08-15 MED ORDER — ISOSORBIDE MONONITRATE ER 30 MG PO TB24: 30.0000 mg | ORAL_TABLET | Freq: Every day | ORAL | Status: DC

## 2011-08-15 MED ORDER — SIMVASTATIN 20 MG PO TABS: 20.0000 mg | ORAL_TABLET | Freq: Every day | ORAL | Status: DC

## 2011-08-15 MED ORDER — AMLODIPINE BESYLATE 10 MG PO TABS: 10.0000 mg | ORAL_TABLET | Freq: Every day | ORAL | Status: DC

## 2011-08-15 NOTE — Telephone Encounter (Signed)
New problem:     Patient's wife called in needing a refill of her husband's ALPRAZolam (XANAX) 0.5 MG tablet, allopurinol (ZYLOPRIM) 100 MG tablet, amLODipine (NORVASC) 10 MG tablet, buPROPion (WELLBUTRIN XL) 150 MG 24 hr tablet, furosemide (LASIX) 80 MG tablet, isosorbide mononitrate (IMDUR) 30 MG 24 hr tablet, losartan (COZAAR) 50 MG tablet, multivitamin-lutein (OCUVITE-LUTEIN) CAPS, simvastatin (ZOCOR) 20 MG tablet. Please call back when the order has been placed.

## 2011-08-15 NOTE — Telephone Encounter (Signed)
Follow-up:      Patient's wife called again wondering if her husbands medication has been filled.  Please call back.

## 2011-08-16 ENCOUNTER — Telehealth: Payer: Self-pay | Admitting: *Deleted

## 2011-08-16 ENCOUNTER — Telehealth: Payer: Self-pay | Admitting: Internal Medicine

## 2011-08-16 NOTE — Telephone Encounter (Signed)
Pt called and is req a call back from [place] asap today. Pt needs to discuss some meds.

## 2011-08-16 NOTE — Telephone Encounter (Signed)
Pt wants to make appt with Dr. Cato Mulligan to discuss his wife's meds.  He states she is mentally not herself.

## 2011-08-16 NOTE — Telephone Encounter (Signed)
Noticed pt's wife that the original 3 medications she asked for were rx from his PCP and those request were routed to the PCP yesterday. The other medications in which she later requested were already ordered with refills from prior months with plenty refills. She verbally ok'd and will get in touch with their PCP for further notice.

## 2011-08-17 NOTE — Telephone Encounter (Signed)
I spoke with wife and some of pt's medication bottles have lost their labels. She is taking those bottles to their pharmacy ([facility]) to help her identify those medications. Mylo Red RN

## 2011-08-17 NOTE — Telephone Encounter (Signed)
New Problem:     Patient's wife called in because he is having a hard time determining his medication.  Please call back.

## 2011-08-19 NOTE — Telephone Encounter (Signed)
Schedule CT head without contrast--- mental status change

## 2011-08-20 NOTE — Telephone Encounter (Signed)
Order was placed under [facility] chart.  This was documented under the wrong chart

## 2011-09-19 ENCOUNTER — Other Ambulatory Visit: Payer: Self-pay | Admitting: *Deleted

## 2011-09-19 DIAGNOSIS — F419 Anxiety disorder, unspecified: Secondary | ICD-10-CM

## 2011-09-19 MED ORDER — ALPRAZOLAM 0.5 MG PO TABS: 0.5000 mg | ORAL_TABLET | Freq: Two times a day (BID) | ORAL | Status: DC | PRN

## 2011-11-22 ENCOUNTER — Telehealth: Payer: Self-pay | Admitting: Internal Medicine

## 2011-11-22 NOTE — Telephone Encounter (Signed)
Form up front ready for p/u, pt aware 

## 2011-11-22 NOTE — Telephone Encounter (Signed)
Patient's spouse called stating that they need a handicap placard form as theirs has expired and they received a ticket and need this before 12pm. Please assist.

## 2011-12-24 ENCOUNTER — Ambulatory Visit (INDEPENDENT_AMBULATORY_CARE_PROVIDER_SITE_OTHER): Payer: Medicare Other | Admitting: Cardiology

## 2011-12-24 ENCOUNTER — Encounter: Payer: Self-pay | Admitting: Cardiology

## 2011-12-24 VITALS — BP 169/81 | HR 72 | Ht 70.0 in | Wt 200.8 lb

## 2011-12-24 DIAGNOSIS — N259 Disorder resulting from impaired renal tubular function, unspecified: Secondary | ICD-10-CM

## 2011-12-24 DIAGNOSIS — E785 Hyperlipidemia, unspecified: Secondary | ICD-10-CM

## 2011-12-24 DIAGNOSIS — I25810 Atherosclerosis of coronary artery bypass graft(s) without angina pectoris: Secondary | ICD-10-CM

## 2011-12-24 DIAGNOSIS — I2510 Atherosclerotic heart disease of native coronary artery without angina pectoris: Secondary | ICD-10-CM

## 2011-12-24 DIAGNOSIS — I5032 Chronic diastolic (congestive) heart failure: Secondary | ICD-10-CM

## 2011-12-24 DIAGNOSIS — I10 Essential (primary) hypertension: Secondary | ICD-10-CM

## 2011-12-24 DIAGNOSIS — R0602 Shortness of breath: Secondary | ICD-10-CM

## 2011-12-24 MED ORDER — CARVEDILOL 12.5 MG PO TABS: ORAL_TABLET | ORAL | Status: DC

## 2011-12-24 MED ORDER — CARVEDILOL 25 MG PO TABS: 25.0000 mg | ORAL_TABLET | Freq: Two times a day (BID) | ORAL | Status: DC

## 2011-12-24 NOTE — Assessment & Plan Note (Signed)
Stable NYHA class III symptoms.  Weight is down 5 lbs since last appointment.  He has chronic lower extremity edema but JVP is not elevated.  Would have him continue the current dose of Lasix.  He needs to continue to watch his salt intake.  He has not been able to tolerate compression stockings.

## 2011-12-24 NOTE — Assessment & Plan Note (Signed)
Check lipids/LFTs, goal LDL < 70.  

## 2011-12-24 NOTE — Assessment & Plan Note (Signed)
Stable severe CAD.  Native vessels occluded and SVGs occluded. Has only LIMA-LAD with good collaterals to other circulations.  He is not a candidate for intervention or redo CABG.  He denies exertional chest pain. Continue ASA 81 mg, Coreg, Imdur, ARB, statin.  

## 2011-12-24 NOTE — Progress Notes (Signed)
Patient ID: Harold [name], male   DOB: 1929/01/20, 78 y.o.   MRN: [id number] PCP: Dr. Cato Mulligan  78 yo with history of CAD s/p CABG and diastolic CHF presents to cardiology clinic for followup.   He had CABG in 1998.  Last cath in 2010 showed all grafts occluded except LIMA-LAD.  He was not a candidate for intervention or redo CABG.  He has significant diastolic CHF, EF 40% with moderate LVH on 2/13 echo.  At a prior appointment, he was volume overloaded and I increased his Lasix to 80 mg bid.  Since then, he has been losing weight.  He is down another 5 lbs. He has chronic dyspnea after walking about 100 feet.  He is not very active  No chest pain.  He is having trouble with his new CPAP machine and has not been using it.  BP is running high today again.  I had wanted him to increase his Coreg to 18.75 mg bid at last appointment but he is still taking 12.5 mg bid.  Lower extremity edema is stable.     Labs (6/12): K 3.9, creatinine 1.5, LDL 82, HDL 33, LFTs normal Labs (9/81): K 3.8, creatinine 2.2 => 1.9 Labs (9/12): K 4.1, creatinine 1.6, BNP 447 Labs (10/12): K 4.5, creatinine 1.8, BNP 364 Labs (12/12): BNP 206, K 4.1, creatinine 1.9, LDL 66, HDL 33 Labs (2/13): BNP 268, K 3.9, creatinine 1.7 Labs (4/13): K 3.8, creatinine 1.7, BNP 486  PMH: 1. CAD: CABG in 1998.  Last cath in 2010 showed all native vessels occluded and all SVGs occluded, LIMA-LAD was patent with good collaterals to other circulations.  No interventional options and was not a good candidate for redo surgery.  2. Macular degeneration.  3. Gout 4. AAA s/p repair 5. PAD 6. OSA: CPAP 7. Diastolic CHF: Echo (5/10) with EF 55%, inferior hypokinesis, grade III diastolic dysfunction (restrictive), no significant valvular dysfunction.  Echo (2/13) with EF 55%, moderate LVH, moderate diastolic dysfunciton, normal RV, mild MR.  8. 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  Nonsmoker.     FH: CAD  ROS: All systems reviewed and negative except as per HPI.   Current Outpatient Prescriptions  Medication Sig Dispense Refill  . allopurinol (ZYLOPRIM) 100 MG tablet Take 100 mg by mouth daily.        Marland Kitchen ALPRAZolam (XANAX) 0.5 MG tablet Take 1 tablet (0.5 mg total) by mouth 2 (two) times daily as needed.  60 tablet  2  . amLODipine (NORVASC) 10 MG tablet Take 1 tablet (10 mg total) by mouth daily.  30 tablet  6  . aspirin EC 81 MG tablet Take 1 tablet (81 mg total) by mouth daily.      Marland Kitchen buPROPion (WELLBUTRIN XL) 150 MG 24 hr tablet TAKE 1 TABLET BY MOUTH TWICE A DAY  60 tablet  10  . furosemide (LASIX) 80 MG tablet Take 1 tablet (80 mg total) by mouth 2 (two) times daily.  60 tablet  6  . isosorbide mononitrate (IMDUR) 30 MG 24 hr tablet Take 1 tablet (30 mg total) by mouth daily.  30 tablet  8  . losartan (COZAAR) 50 MG tablet Take 1 tablet (50 mg total) by mouth daily.  30 tablet  6  . multivitamin-lutein (OCUVITE-LUTEIN) CAPS Take 1 capsule by mouth daily.      . nitroGLYCERIN (NITROSTAT) 0.4 MG SL tablet Place 0.4 mg under the  tongue every 5 (five) minutes as needed.        . potassium chloride SA (KLOR-CON M20) 20 MEQ tablet Take 1 tablet (20 mEq total) by mouth daily.  30 tablet  6  . simvastatin (ZOCOR) 20 MG tablet Take 1 tablet (20 mg total) by mouth daily.  30 tablet  10  . testosterone cypionate (DEPOTESTOTERONE CYPIONATE) 200 MG/ML injection Inject 1 mL (200 mg total) into the muscle every 28 (twenty-eight) days.  10 mL  0  . DISCONTD: carvedilol (COREG) 12.5 MG tablet Take 1 and 1/2 tablets(total 18.75mg ) twice a day  90 tablet  6  . carvedilol (COREG) 12.5 MG tablet Take 1 and 1/2 tablets(total 18.75mg  ) two times a day  90 tablet  6  . DISCONTD: carvedilol (COREG) 25 MG tablet Take 1 tablet (25 mg total) by mouth 2 (two) times daily.  60 tablet  6    BP 169/81  Pulse 72  Ht 5\' 10"  (1.778 m)  Wt 200 lb 12.8 oz (91.082 kg)  BMI 28.81 kg/m2 General: NAD Neck: No  JVD, no thyromegaly or thyroid nodule.  Lungs: Clear to auscultation bilaterally with normal respiratory effort. CV: Nondisplaced PMI.  Heart regular S1/S2, no S3/S4, 1/6 SEM.  1+ edema 1/2 way to knees bilaterally.  No carotid bruit.  Difficult to feel pedal pulses given edema.   Abdomen: Soft, nontender, no hepatosplenomegaly, no distention.  Neurologic: Alert and oriented x 3.  Psych: Normal affect. Extremities: No clubbing or cyanosis.

## 2011-12-24 NOTE — Assessment & Plan Note (Signed)
BP is still high.  I will have him increase Coreg to 18.75 mg bid.  I would also like to have him re-try CPAP. He will do this and will followup with his sleep MD if he still cannot use it.

## 2011-12-24 NOTE — Assessment & Plan Note (Signed)
Check BMET to follow creatinine.

## 2011-12-24 NOTE — Patient Instructions (Addendum)
Increase coreg(carvedilol) to 18.75 mg two times a day. This will be one and one-half 12.5mg  coreg two times a day.  Your physician recommends that you return for a FASTING lipid profile /BMET/BNP.  Your physician recommends that you schedule a follow-up appointment in: 3 months with Dr Shirlee Latch.

## 2012-01-14 ENCOUNTER — Ambulatory Visit: Payer: Medicare Other | Admitting: *Deleted

## 2012-01-18 ENCOUNTER — Telehealth: Payer: Self-pay | Admitting: *Deleted

## 2012-01-18 NOTE — Telephone Encounter (Signed)
Spoke with pharmacist, Stephan Minister at [facility]. She had reviewed and reconciled medications with pt today. She was going to fill pill boxes for pt. She was concerned because pt was confused about his medications and may not have been taking them as prescribed in the last week or so. She recommended pt come to office for follow up next week since pt had not been taking his medications as prescribed. She was concerned about his BP and because he had discontinued lasix due to frequent urination. I spoke with pt. I have given him an appt to see Sunday Spillers 01/22/12. Pt agreed with this plan.

## 2012-01-22 ENCOUNTER — Ambulatory Visit: Payer: Medicare Other | Admitting: Nurse Practitioner

## 2012-01-24 ENCOUNTER — Other Ambulatory Visit: Payer: Self-pay | Admitting: Cardiology

## 2012-01-24 NOTE — Telephone Encounter (Signed)
Refilled potassium

## 2012-01-28 ENCOUNTER — Other Ambulatory Visit: Payer: Self-pay | Admitting: *Deleted

## 2012-01-28 DIAGNOSIS — F419 Anxiety disorder, unspecified: Secondary | ICD-10-CM

## 2012-01-28 MED ORDER — ALPRAZOLAM 0.5 MG PO TABS: 0.5000 mg | ORAL_TABLET | Freq: Two times a day (BID) | ORAL | Status: DC | PRN

## 2012-02-01 ENCOUNTER — Telehealth: Payer: Self-pay | Admitting: Cardiology

## 2012-02-01 NOTE — Telephone Encounter (Signed)
New problem:  Did not want to disclose any information would like for the triage nurse to call her back .

## 2012-02-01 NOTE — Telephone Encounter (Signed)
Irven Easterly, pt's wife is requesting that Dr. Shirlee Latch call them concerning pt's BP. She would not give any information stating that pt. needs to be seen today or Monday. I offered to schedule an appt. with one of our NP's or PA's b/c Dr. Shirlee Latch schedule is full on Monday but she hung up while on hold.

## 2012-02-03 NOTE — Telephone Encounter (Signed)
Please give Harold [name] a call on Monday.  I will see him if needed.

## 2012-02-04 NOTE — Telephone Encounter (Signed)
Spoke with pt. I offered pt an appt today at 10am  with Dr Shirlee Latch. He states he has a lot to do today and needs to check his schedule. If he cannot come today at 10am to see Dr Shirlee Latch, he can see NP or PA tomorrow.

## 2012-02-06 ENCOUNTER — Telehealth: Payer: Self-pay | Admitting: Cardiology

## 2012-02-12 ENCOUNTER — Encounter: Payer: Self-pay | Admitting: Nurse Practitioner

## 2012-02-12 ENCOUNTER — Ambulatory Visit (INDEPENDENT_AMBULATORY_CARE_PROVIDER_SITE_OTHER): Payer: Medicare Other | Admitting: Nurse Practitioner

## 2012-02-12 VITALS — BP 148/60 | HR 60 | Ht 70.0 in | Wt 202.0 lb

## 2012-02-12 DIAGNOSIS — I5032 Chronic diastolic (congestive) heart failure: Secondary | ICD-10-CM

## 2012-02-12 DIAGNOSIS — R5381 Other malaise: Secondary | ICD-10-CM

## 2012-02-12 DIAGNOSIS — R5383 Other fatigue: Secondary | ICD-10-CM

## 2012-02-12 DIAGNOSIS — Z9119 Patient's noncompliance with other medical treatment and regimen: Secondary | ICD-10-CM

## 2012-02-12 LAB — CBC WITH DIFFERENTIAL/PLATELET
Basophils Absolute: 0 10*3/uL (ref 0.0–0.1)
Basophils Relative: 0.4 % (ref 0.0–3.0)
Eosinophils Absolute: 0.2 10*3/uL (ref 0.0–0.7)
Eosinophils Relative: 2.4 % (ref 0.0–5.0)
HCT: 42.2 % (ref 39.0–52.0)
Hemoglobin: 13.8 g/dL (ref 13.0–17.0)
Lymphocytes Relative: 15.7 % (ref 12.0–46.0)
Lymphs Abs: 1.5 10*3/uL (ref 0.7–4.0)
MCHC: 32.7 g/dL (ref 30.0–36.0)
MCV: 85.3 fl (ref 78.0–100.0)
Monocytes Absolute: 1.1 10*3/uL — ABNORMAL HIGH (ref 0.1–1.0)
Monocytes Relative: 11.3 % (ref 3.0–12.0)
Neutro Abs: 6.9 10*3/uL (ref 1.4–7.7)
Neutrophils Relative %: 70.2 % (ref 43.0–77.0)
Platelets: 180 10*3/uL (ref 150.0–400.0)
RBC: 4.95 Mil/uL (ref 4.22–5.81)
RDW: 16.9 % — ABNORMAL HIGH (ref 11.5–14.6)
WBC: 9.8 10*3/uL (ref 4.5–10.5)

## 2012-02-12 LAB — BASIC METABOLIC PANEL
BUN: 44 mg/dL — ABNORMAL HIGH (ref 6–23)
CO2: 29 mEq/L (ref 19–32)
Calcium: 9.2 mg/dL (ref 8.4–10.5)
Chloride: 102 mEq/L (ref 96–112)
Creatinine, Ser: 2.2 mg/dL — ABNORMAL HIGH (ref 0.4–1.5)
GFR: 31.53 mL/min — ABNORMAL LOW (ref >60.00)
Glucose, Bld: 94 mg/dL (ref 70–99)
Potassium: 4.3 mEq/L (ref 3.5–5.1)
Sodium: 140 mEq/L (ref 135–145)

## 2012-02-12 LAB — TSH: TSH: 2.45 u[IU]/mL (ref 0.35–5.50)

## 2012-02-12 MED ORDER — FUROSEMIDE 80 MG PO TABS: 80.0000 mg | ORAL_TABLET | Freq: Every day | ORAL | Status: DC

## 2012-02-12 NOTE — Progress Notes (Signed)
Harold [name] Date of Birth: 12-30-1928 Medical Record #[id number]  History of Present Illness: Mr. Polacek is seen today for a work in visit. He is seen for Dr. Shirlee Latch. He has multiple medical issues which include CAD, s/p CABG in 1998 and diastolic heart failure. His last cath in 2010 showed all grafts occluded except the LIMA to LAD. He was not a candidate for intervention or redo surgery. He has significant diastolic CHF. His EF is 55% with moderate LVH per echo earlier this year. He is legally blind due to macular degeneration. Also has OSA and is suppose to use CPAP. Other problems include prior AAA repair, CKD and advanced age.   He comes in today. He is here with his wife. He is here at the request of the pharmacist from [facility]. Roe Coombs is blind. He is still trying to manage his medicines. He uses a pill box that the pharmacist fills. She has noticed missed pills and missed refills. He is agreeable to the fact that he is probably not taking his medicines as prescribed. His wife does not help him with the administration of his medicines. He cannot even see the paper that has his list in an enlarged font.   He denies chest pain or shortness of breath. They do out considerably. Probably has too much salt. Says the fluid pills make him pee too much. He admits that he probably does not take his medicines right. Certainly has days that he does not take them and probably has days that he ends up taking too much. Says they make him feel "drunk". He is falling.   Current Outpatient Prescriptions on File Prior to Visit  Medication Sig Dispense Refill  . ALPRAZolam (XANAX) 0.5 MG tablet Take 1 tablet (0.5 mg total) by mouth 2 (two) times daily as needed.  60 tablet  2  . amLODipine (NORVASC) 10 MG tablet Take 1 tablet (10 mg total) by mouth daily.  30 tablet  6  . buPROPion (WELLBUTRIN XL) 150 MG 24 hr tablet TAKE 1 TABLET BY MOUTH TWICE A DAY  60 tablet  10  . isosorbide mononitrate (IMDUR) 30 MG  24 hr tablet Take 1 tablet (30 mg total) by mouth daily.  30 tablet  8  . losartan (COZAAR) 50 MG tablet Take 1 tablet (50 mg total) by mouth daily.  30 tablet  6  . multivitamin-lutein (OCUVITE-LUTEIN) CAPS Take 1 capsule by mouth daily.      . potassium chloride SA (K-DUR,KLOR-CON) 20 MEQ tablet TAKE 1 TABLET BY MOUTH DAILY.  30 tablet  6  . simvastatin (ZOCOR) 20 MG tablet Take 1 tablet (20 mg total) by mouth daily.  30 tablet  10  . DISCONTD: furosemide (LASIX) 80 MG tablet Take 1 tablet (80 mg total) by mouth 2 (two) times daily.  60 tablet  6  . carvedilol (COREG) 12.5 MG tablet Take 1 and 1/2 tablets(total 18.75mg  ) two times a day  90 tablet  6  . nitroGLYCERIN (NITROSTAT) 0.4 MG SL tablet Place 0.4 mg under the tongue every 5 (five) minutes as needed.        . testosterone cypionate (DEPOTESTOTERONE CYPIONATE) 200 MG/ML injection Inject 1 mL (200 mg total) into the muscle every 28 (twenty-eight) days.  10 mL  0    No Known Allergies  Past Medical History  Diagnosis Date  . CAD (coronary artery disease)     no known MImultiple PCIsall vein grafts occluded, LIMA patent by cath 08/2008  .  FUO (fever of unknown origin) 1994    resolved---unclear etiology  . Gout   . Macular degeneration     followed at [facility]  . PVD (peripheral vascular disease)   . OSA (obstructive sleep apnea)     using CPAP  . Diastolic heart failure     ef 40% echo 08/2008  . Hypertension   . Hyperlipidemia   . Renal insufficiency   . Depression     symptoms  . CKD (chronic kidney disease), stage II     with a glomerular fitration rate of 37 at discharge   . Renal adenoma     hx  . Nephrolithiasis     hx   . Hx of skin cancer, basal cell   . History of echocardiogram 5/10    EF 55%  . AAA (abdominal aortic aneurysm) 2002    3.2 cm, with repair  . History of tobacco use     Remote (same for alcohol use)    Past Surgical History  Procedure Date  . Abdominal aortic aneurysm repair   . Transurethral  resection of prostate   . Coronary artery bypass graft 1990    s/p multiple PCIs with occlusion of al SVGs and only patnet LIMA at cath 5/10   . Cholecystectomy   . Lumbar disc surgery     x5  . Coronary stent placement     multiple; 12/02  . Back surgery   . Prostate surgery     History  Smoking status  . Former Smoker -- 2.0 packs/day for 52 years  . Quit date: 04/30/1988  Smokeless tobacco  . Not on file  Comment: smoked x52 years; up to 2 ppd     History  Alcohol Use No    Family History  Problem Relation Age of Onset  . Emphysema Father   . Heart disease Paternal Uncle     x2    Review of Systems: The review of systems is per the HPI.  All other systems were reviewed and are negative.  Physical Exam: BP 148/60  Pulse 60  Ht 5\' 10"  (1.778 m)  Wt 202 lb (91.627 kg)  BMI 28.98 kg/m2 His weight is unchanged.  Patient is quite interesting and in no acute distress. Skin is warm and dry. Color is normal.  HEENT is unremarkable except for dressing on the left side of his head from recent skin cancer removal. Normocephalic/atraumatic. PERRL. Sclera are nonicteric. Neck is supple. No masses. No JVD. Lungs are clear. Cardiac exam shows a regular rate and rhythm. Soft murmur noted. Abdomen is soft. Extremities are with 1+ edema. Gait and ROM are intact. No gross neurologic deficits noted.  LABORATORY DATA:  Lab Results  Component Value Date   WBC 6.0 09/29/2010   HGB 13.8 09/29/2010   HCT 41.3 09/29/2010   PLT 147.0* 09/29/2010   GLUCOSE 81 08/13/2011   CHOL 130 04/02/2011   TRIG 156.0* 04/02/2011   HDL 32.50* 04/02/2011   LDLDIRECT 99.1 10/03/2007   LDLCALC 66 04/02/2011   ALT 14 09/29/2010   AST 21 09/29/2010   NA 139 08/13/2011   K 3.8 08/13/2011   CL 103 08/13/2011   CREATININE 1.7* 08/13/2011   BUN 31* 08/13/2011   CO2 25 08/13/2011   TSH 2.84 03/20/2011   INR 1.3 09/06/2008    Assessment / Plan: 1. Diastolic heart failure   2. Medication noncompliance - this is the  biggest issue. He is quite resistant to having his wife  help him with the administration of his medicines. I suspect that he underdoses and probably overdoses on his medicines as well as just does not take them altogether. I have tried to stress the importance of compliance and that the only person that this hurts is himself. His wife at first seems very resistant to help but at the end of the visit was more agreeable to overseeing him take his medicines. I explained that we can adjust medicines as needed and that checking his blood pressure and documenting daily weights will be imperative. His wife will have to help in this regards as well. The salt is going to be an issue that I do not see getting resolved.   Today, I have cut back the Lasix to just once a day (he probably has only been doing this anyway). I have also cut the Xanax back to just at night. We will check labs today as well. We will see him back in 2 weeks. Will try to optimize/cut back medicines as appropriate. I will also talk with the pharmacist at [facility]. Unfortunately, I do not see this going well for long term prognosis.  Patient is agreeable to this plan and will call if any problems develop in the interim.

## 2012-02-12 NOTE — Patient Instructions (Addendum)
We are checking labs today  I have cut the Lasix (Furosemide) back to just once a day in the AM  Cut the Xanax back to just at night and ONLY if needed  We will see you in 2 weeks  Call the [facility] office at [phone] if you have any questions, problems or concerns.

## 2012-02-13 ENCOUNTER — Telehealth: Payer: Self-pay | Admitting: Cardiology

## 2012-02-13 NOTE — Telephone Encounter (Signed)
Noted  

## 2012-02-13 NOTE — Telephone Encounter (Signed)
I have called and spoken with the pharmacist at [facility] and given her an update as to his medicine changes.

## 2012-02-13 NOTE — Telephone Encounter (Signed)
FYI      F/u   Brysha from [facility] [phone], notes patient has not taken any of this morning medication over the last 7 days.

## 2012-02-13 NOTE — Telephone Encounter (Signed)
New Problem:    Called returning a call from you.  Please call back.

## 2012-02-18 ENCOUNTER — Other Ambulatory Visit: Payer: Medicare Other

## 2012-02-28 ENCOUNTER — Encounter: Payer: Self-pay | Admitting: Nurse Practitioner

## 2012-02-28 ENCOUNTER — Ambulatory Visit (INDEPENDENT_AMBULATORY_CARE_PROVIDER_SITE_OTHER): Payer: Medicare Other | Admitting: Nurse Practitioner

## 2012-02-28 VITALS — BP 110/58 | HR 56 | Ht 70.5 in | Wt 199.8 lb

## 2012-02-28 DIAGNOSIS — I5032 Chronic diastolic (congestive) heart failure: Secondary | ICD-10-CM

## 2012-02-28 NOTE — Patient Instructions (Addendum)
I think you are doing better  Stay on the current medicines.  I would suggest taking the Lasix in the daytime  See Dr. Shirlee Latch as scheduled   Call the [facility] office at [phone] if you have any questions, problems or concerns.

## 2012-02-28 NOTE — Progress Notes (Signed)
Harold [name] Date of Birth: 14-Feb-1929 Medical Record #[id number]  History of Present Illness: Harold [name] is seen back today for a 2 week check. He is seen for Dr. Shirlee Latch. He has multiple medical issues which include CAD, s/p CABG in 1998 and diastolic heart failure. His last cath in 2010 showed all grafts occluded except the LIMA to LAD. He was not a candidate for intervention or redo surgery. He has significant diastolic CHF. His EF is 55% with moderate LVH per echo earlier this year. He is legally blind due to macular degeneration. Also has OSA and is suppose to use CPAP. Other problems include prior AAA repair, CKD and advanced age.   I saw him 2 weeks ago at the request of the pharmacist at [facility]. There were major issues in regards to his medicines. He is blind and was trying to be in charge of his medicines. Wife agreed to oversee. We also cut his lasix back (that he really wasn't even taking) and cut back his Xanax due to somnolence.   He comes back today. He is here with his wife. He is doing "much better". They both agree about this. He is not short of breath or having chest pain. She is overseeing his medicines. He tells her every morning that he feels good. He is trying to be more active. Not able to use a stationary bike but says he can use a treadmill which she is "getting ready to pull out of the corner". They are both happy today with how Harold [name] is doing.    Current Outpatient Prescriptions on File Prior to Visit  Medication Sig Dispense Refill  . ALPRAZolam (XANAX) 0.5 MG tablet Take 1 tablet (0.5 mg total) by mouth 2 (two) times daily as needed.  60 tablet  2  . amLODipine (NORVASC) 10 MG tablet Take 1 tablet (10 mg total) by mouth daily.  30 tablet  6  . buPROPion (WELLBUTRIN XL) 150 MG 24 hr tablet TAKE 1 TABLET BY MOUTH TWICE A DAY  60 tablet  10  . carvedilol (COREG) 12.5 MG tablet Take 1 and 1/2 tablets(total 18.75mg  ) two times a day  90 tablet  6  .  furosemide (LASIX) 80 MG tablet Take 1 tablet (80 mg total) by mouth daily.  60 tablet  6  . isosorbide mononitrate (IMDUR) 30 MG 24 hr tablet Take 1 tablet (30 mg total) by mouth daily.  30 tablet  8  . losartan (COZAAR) 50 MG tablet Take 1 tablet (50 mg total) by mouth daily.  30 tablet  6  . multivitamin-lutein (OCUVITE-LUTEIN) CAPS Take 1 capsule by mouth daily.      . nitroGLYCERIN (NITROSTAT) 0.4 MG SL tablet Place 0.4 mg under the tongue every 5 (five) minutes as needed.        . potassium chloride SA (K-DUR,KLOR-CON) 20 MEQ tablet TAKE 1 TABLET BY MOUTH DAILY.  30 tablet  6  . simvastatin (ZOCOR) 20 MG tablet Take 1 tablet (20 mg total) by mouth daily.  30 tablet  10  . testosterone cypionate (DEPOTESTOTERONE CYPIONATE) 200 MG/ML injection Inject 1 mL (200 mg total) into the muscle every 28 (twenty-eight) days.  10 mL  0    No Known Allergies  Past Medical History  Diagnosis Date  . CAD (coronary artery disease)     no known MImultiple PCIsall vein grafts occluded, LIMA patent by cath 08/2008  . FUO (fever of unknown origin) 1994    resolved---unclear  etiology  . Gout   . Macular degeneration     [facility] at [facility]  . PVD (peripheral vascular disease)   . OSA (obstructive sleep apnea)     using CPAP  . Diastolic heart failure     ef 09% echo 08/2008  . Hypertension   . Hyperlipidemia   . Renal insufficiency   . Depression     symptoms  . CKD (chronic kidney disease), stage II     with a glomerular fitration rate of 37 at discharge   . Renal adenoma     hx  . Nephrolithiasis     hx   . Hx of skin cancer, basal cell   . History of echocardiogram 5/10    EF 55%  . AAA (abdominal aortic aneurysm) 2002    3.2 cm, with repair  . History of tobacco use     Remote (same for alcohol use)    Past Surgical History  Procedure Date  . Abdominal aortic aneurysm repair   . Transurethral resection of prostate   . Coronary artery bypass graft 1990    s/p multiple PCIs with  occlusion of al SVGs and only patnet LIMA at cath 5/10   . Cholecystectomy   . Lumbar disc surgery     x5  . Coronary stent placement     multiple; 12/02  . Back surgery   . Prostate surgery     History  Smoking status  . Former Smoker -- 2.0 packs/day for 52 years  . Quit date: 04/30/1988  Smokeless tobacco  . Not on file  Comment: smoked x52 years; up to 2 ppd     History  Alcohol Use No    Family History  Problem Relation Age of Onset  . Emphysema Father   . Heart disease Paternal Uncle     x2    Review of Systems: The review of systems is per the HPI.  All other systems were reviewed and are negative.  Physical Exam: BP 110/58  Pulse 56  Ht 5' 10.5" (1.791 m)  Wt 199 lb 12.8 oz (90.629 kg)  BMI 28.26 kg/m2 Patient is very pleasant and in no acute distress. He actually looks better today too. Skin is warm and dry. Color is normal.  HEENT is unremarkable. Normocephalic/atraumatic. PERRL. Sclera are nonicteric. Neck is supple. No masses. No JVD. Lungs are clear. Cardiac exam shows a regular rate and rhythm. Heart tones are distant. Soft murmur noted. Abdomen is soft. Extremities are without significant edema. Gait and ROM are intact. No gross neurologic deficits noted.   LABORATORY DATA: Lab Results  Component Value Date   WBC 9.8 02/12/2012   HGB 13.8 02/12/2012   HCT 42.2 02/12/2012   PLT 180.0 02/12/2012   GLUCOSE 94 02/12/2012   CHOL 130 04/02/2011   TRIG 156.0* 04/02/2011   HDL 32.50* 04/02/2011   LDLDIRECT 99.1 10/03/2007   LDLCALC 66 04/02/2011   ALT 14 09/29/2010   AST 21 09/29/2010   NA 140 02/12/2012   K 4.3 02/12/2012   CL 102 02/12/2012   CREATININE 2.2* 02/12/2012   BUN 44* 02/12/2012   CO2 29 02/12/2012   TSH 2.45 02/12/2012   INR 1.3 09/06/2008     Assessment / Plan: 1. Diastolic heart failure - he looks pretty compensated. Weight is down a couple of pounds. He feels good. He is taking the Lasix in the evening and says he does get up several  times at night. He is going  to let the pharmacist at [facility] know to put it in his morning slot on his pill box. He is to see Dr. Shirlee Latch in about 2 weeks.   2. Noncompliance - He does seem to be doing better. Wife is overseeing his medicines. On less Xanax as well.   3. CKD  4. CAD - no chest pain  5. Macular degeneration - he is legally blind.  Patient is agreeable to this plan and will call if any problems develop in the interim.

## 2012-03-17 ENCOUNTER — Ambulatory Visit: Payer: Medicare Other | Admitting: Cardiology

## 2012-03-24 ENCOUNTER — Ambulatory Visit: Payer: Medicare Other | Admitting: Cardiology

## 2012-04-18 ENCOUNTER — Ambulatory Visit: Payer: Medicare Other | Admitting: Cardiology

## 2012-05-01 ENCOUNTER — Telehealth: Payer: Self-pay | Admitting: *Deleted

## 2012-05-01 NOTE — Telephone Encounter (Signed)
Pt states he had a gout flare up on his rt great toe.  He has never been on any gout medicine and refuses OV with another physician because he nor his wife can drive.  Pt is aware that Dr Cato Mulligan is out of the office till Monday.  Pt states if another dr will not call in anything than he will wait for Dr Cato Mulligan.  Please advise

## 2012-05-03 NOTE — Telephone Encounter (Signed)
Call and see how he is doing 

## 2012-05-05 ENCOUNTER — Telehealth: Payer: Self-pay | Admitting: Internal Medicine

## 2012-05-05 MED ORDER — PREDNISONE 20 MG PO TABS: 20.0000 mg | ORAL_TABLET | Freq: Every day | ORAL | Status: DC

## 2012-05-05 NOTE — Telephone Encounter (Signed)
Schedule appt with any provider

## 2012-05-05 NOTE — Telephone Encounter (Signed)
Patient Information:  Caller Name: [place]  Phone: [phone]  Patient: Harold [name], Harold [name]  Gender: Male  DOB: November 03, 1928  Age: 79 Years  PCP: Birdie Sons (Adults only)  Office Follow Up:  Does the office need to follow up with this patient?: Yes  Instructions For The Office: Please call patient concerning blood pressure follow up  RN Note:  Patient denies any symptoms. He does not have a way to check his blood pressure at this time and he does not drive due to Macular degeneration. He states blood pressure was better before the nurse left, he could not remember top number but bottom number was 75.  Symptoms  Reason For Call & Symptoms: Nurse was out to see his wife this morning and took his blood pressure. It was 191/100. Before she left it was better. He can not remember the top number but bottom was 75. Patient could not remember names of medications he is on, it is in his pill box and he did take them today. He has macular degeneration and can not read the pill bottle.  Reviewed Health History In EMR: Yes  Reviewed Medications In EMR: Yes  Reviewed Allergies In EMR: Yes  Reviewed Surgeries / Procedures: Yes  Date of Onset of Symptoms: 05/05/2012  Guideline(s) Used:  High Blood Pressure  Disposition Per Guideline:   See Within 2 Weeks in Office  Reason For Disposition Reached:   BP > 160/100  Advice Given:  Lifestyle Changes  Eat a diet high in fresh fruits and low-fat dairy products. Limit your intake of saturated and total fat. Choose foods that are lower in salt.  If you drink alcohol, you should limit your daily alcohol drinking.  Call Back If:  Headache, blurred vision, difficulty talking, or difficulty walking occurs  Chest pain or difficulty breathing occurs  You become worse.  Patient Refused Recommendation:  Patient Refused Care Advice  Patient calling to notify physician of blood pressure reading earlier today, did not want to make an appointment

## 2012-05-05 NOTE — Telephone Encounter (Addendum)
Prednisone 20 mg once daily for 4 days pt aware, rx sent in electronically to [facility]

## 2012-05-06 NOTE — Telephone Encounter (Signed)
Line busy at home and cell phone disconnected

## 2012-05-20 ENCOUNTER — Telehealth: Payer: Self-pay | Admitting: *Deleted

## 2012-05-20 NOTE — Telephone Encounter (Signed)
Pt states the prednisone that was called in did not work.  He states gout is now in his other toe.

## 2012-05-22 NOTE — Telephone Encounter (Signed)
Patient called back upset.  Do you want me to schedule an OV with another provider? He has transportation issues but you have never seen him for gout.

## 2012-05-23 MED ORDER — COLCHICINE 0.6 MG PO TABS: 0.6000 mg | ORAL_TABLET | Freq: Two times a day (BID) | ORAL | Status: DC

## 2012-05-23 NOTE — Telephone Encounter (Signed)
appt scheduled

## 2012-05-23 NOTE — Telephone Encounter (Signed)
Can try colchicine 0.6 mg po bid for 3 days. #6/0 refills

## 2012-05-23 NOTE — Telephone Encounter (Signed)
Pt aware, rx sent in electronically 

## 2012-05-26 ENCOUNTER — Ambulatory Visit: Payer: Medicare Other | Admitting: Family Medicine

## 2012-05-29 ENCOUNTER — Ambulatory Visit: Payer: Medicare Other | Admitting: Family Medicine

## 2012-06-03 ENCOUNTER — Encounter: Payer: Self-pay | Admitting: Family Medicine

## 2012-06-03 ENCOUNTER — Ambulatory Visit (INDEPENDENT_AMBULATORY_CARE_PROVIDER_SITE_OTHER): Payer: Medicare Other | Admitting: Family Medicine

## 2012-06-03 VITALS — BP 130/70 | HR 60 | Temp 97.7°F | Resp 18 | Wt 192.0 lb

## 2012-06-03 DIAGNOSIS — I10 Essential (primary) hypertension: Secondary | ICD-10-CM

## 2012-06-03 DIAGNOSIS — R609 Edema, unspecified: Secondary | ICD-10-CM

## 2012-06-03 DIAGNOSIS — M109 Gout, unspecified: Secondary | ICD-10-CM

## 2012-06-03 MED ORDER — PREDNISONE 20 MG PO TABS: 40.0000 mg | ORAL_TABLET | Freq: Every day | ORAL | Status: DC

## 2012-06-03 NOTE — Patient Instructions (Signed)
-  take the prednisone for 4 days as instructed  -take our lasix every day and use the compression stockings as instructed, please elevate legs for 30 minutes daily  -follow up with Dr. Cato Mulligan on Feb 21st at 8:45, or sooner if any concerns

## 2012-06-03 NOTE — Progress Notes (Signed)
Chief Complaint  Patient presents with  . c/o pain if feet and swelling    HPI:  Acute visit for HTN and gout:  HTN: -per phone notes had elevated BP at home on 1/16 -takes norvasc 10, coreg 18.75mg , lasix 80mg  daily, imdur 30mg , losartan 50mg  -denies CP, SOB, palpitations, swelling, HA -followed by PCP Dr. Cato Mulligan and Dr. Tyrone Sage in [facility]  Gout: -hx of gout -3 weeks ago had gout flare in R toe, has felt like had some in L toe too -tx with PCP per phone calls with prednisone for 4 days, then colchicine for 3 days -has been feeling better, but still hurts at times -redness and swelling has gone down - no pain right now -denies fevers or malaise  Chronic LE edema: -worse for last 3 years -has had this for a long time and reports has venous insufficiency -takes lasix sometimes, but not daily -reports has had several work ups in the past for this and was told is venous insufficiency   ROS: See pertinent positives and negatives per HPI.  Past Medical History  Diagnosis Date  . CAD (coronary artery disease)     no known MImultiple PCIsall vein grafts occluded, LIMA patent by cath 08/2008  . FUO (fever of unknown origin) 1994    resolved---unclear etiology  . Gout   . Macular degeneration     followed at [facility]  . PVD (peripheral vascular disease)   . OSA (obstructive sleep apnea)     using CPAP  . Diastolic heart failure     ef 16% echo 08/2008  . Hypertension   . Hyperlipidemia   . Renal insufficiency   . Depression     symptoms  . CKD (chronic kidney disease), stage II     with a glomerular fitration rate of 37 at discharge   . Renal adenoma     hx  . Nephrolithiasis     hx   . Hx of skin cancer, basal cell   . History of echocardiogram 5/10    EF 55%  . AAA (abdominal aortic aneurysm) 2002    3.2 cm, with repair  . History of tobacco use     Remote (same for alcohol use)    Family History  Problem Relation Age of Onset  . Emphysema Father   . Heart  disease Paternal Uncle     x2    History   Social History  . Marital Status: Married    Spouse Name: N/A    Number of Children: N/A  . Years of Education: N/A   Occupational History  . Owner of used car lot     Retired   Social History Main Topics  . Smoking status: Former Smoker -- 2.0 packs/day for 52 years    Quit date: 04/30/1988  . Smokeless tobacco: None     Comment: smoked x52 years; up to 2 ppd   . Alcohol Use: No  . Drug Use: None  . Sexually Active: Not Currently   Other Topics Concern  . None   Social History Narrative   Retired used Radio broadcast assistant not get regular exerciseMarried with children work is wife's cell #    Current outpatient prescriptions:ALPRAZolam (XANAX) 0.5 MG tablet, Take 1 tablet (0.5 mg total) by mouth 2 (two) times daily as needed., Disp: 60 tablet, Rfl: 2;  amLODipine (NORVASC) 10 MG tablet, Take 1 tablet (10 mg total) by mouth daily., Disp: 30 tablet, Rfl: 6;  buPROPion (WELLBUTRIN XL) 150 MG  24 hr tablet, TAKE 1 TABLET BY MOUTH TWICE A DAY, Disp: 60 tablet, Rfl: 10 carvedilol (COREG) 12.5 MG tablet, Take 1 and 1/2 tablets(total 18.75mg  ) two times a day, Disp: 90 tablet, Rfl: 6;  furosemide (LASIX) 80 MG tablet, Take 1 tablet (80 mg total) by mouth daily., Disp: 60 tablet, Rfl: 6;  isosorbide mononitrate (IMDUR) 30 MG 24 hr tablet, Take 1 tablet (30 mg total) by mouth daily., Disp: 30 tablet, Rfl: 8;  losartan (COZAAR) 50 MG tablet, Take 1 tablet (50 mg total) by mouth daily., Disp: 30 tablet, Rfl: 6 multivitamin-lutein (OCUVITE-LUTEIN) CAPS, Take 1 capsule by mouth daily., Disp: , Rfl: ;  nitroGLYCERIN (NITROSTAT) 0.4 MG SL tablet, Place 0.4 mg under the tongue every 5 (five) minutes as needed.  , Disp: , Rfl: ;  potassium chloride SA (K-DUR,KLOR-CON) 20 MEQ tablet, TAKE 1 TABLET BY MOUTH DAILY., Disp: 30 tablet, Rfl: 6;  simvastatin (ZOCOR) 20 MG tablet, Take 1 tablet (20 mg total) by mouth daily., Disp: 30 tablet, Rfl: 10 predniSONE (DELTASONE)  20 MG tablet, Take 2 tablets (40 mg total) by mouth daily., Disp: 8 tablet, Rfl: 0;  testosterone cypionate (DEPOTESTOTERONE CYPIONATE) 200 MG/ML injection, Inject 1 mL (200 mg total) into the muscle every 28 (twenty-eight) days., Disp: 10 mL, Rfl: 0  EXAM:  Filed Vitals:   06/03/12 1517  BP: 130/70  Pulse: 60  Temp: 97.7 F (36.5 C)  Resp: 18    There is no height on file to calculate BMI.  GENERAL: vitals reviewed and listed above, alert, oriented, appears well hydrated and in no acute distress  HEENT: atraumatic, conjunttiva clear, no obvious abnormalities on inspection of external nose and ears  NECK: no obvious masses on inspection  LUNGS: clear to auscultation bilaterally, no wheezes, rales or rhonchi, good air movement  CV: HRRR, 1 + bilateral pitting edema  MS: moves all extremities without noticeable abnormality - mild erythema R MCP area, no swelling or heat, mild TTP, cap refill and pulses ok  PSYCH: pleasant and cooperative, no obvious depression or anxiety  ASSESSMENT AND PLAN:  Discussed the following assessment and plan:  1. Gout  predniSONE (DELTASONE) 20 MG tablet  2. Edema    3. HYPERTENSION     -Gout:today mild erythema or R great toe MCP joint - improving and will do shortrepeat  course of prednisone which will likely resolve this - no fevers, improving with tx so doubt other etiology -for his chronic edema advised he take his lasix every day, use compression stockings (rx given), elevate legs and follow up with his PCP or cardiologist - no CP, SOB -BP great today  -Patient advised to return or notify a doctor immediately if symptoms worsen or persist or new concerns arise.  Patient Instructions  -take the prednisone for 4 days as instructed  -take our lasix every day and use the compression stockings as instructed, please elevate legs for 30 minutes daily  -follow up with Dr. Cato Mulligan on Feb 21st at 8:45, or sooner if any concerns     Magdaline Zollars,  [facility] R.

## 2012-06-05 ENCOUNTER — Other Ambulatory Visit: Payer: Self-pay | Admitting: Internal Medicine

## 2012-06-20 ENCOUNTER — Ambulatory Visit: Payer: Medicare Other | Admitting: Internal Medicine

## 2012-06-24 ENCOUNTER — Ambulatory Visit (INDEPENDENT_AMBULATORY_CARE_PROVIDER_SITE_OTHER): Payer: Medicare Other | Admitting: Internal Medicine

## 2012-06-24 ENCOUNTER — Encounter: Payer: Self-pay | Admitting: Internal Medicine

## 2012-06-24 VITALS — BP 192/94 | Temp 98.1°F | Wt 191.0 lb

## 2012-06-24 DIAGNOSIS — I2510 Atherosclerotic heart disease of native coronary artery without angina pectoris: Secondary | ICD-10-CM

## 2012-06-24 DIAGNOSIS — M109 Gout, unspecified: Secondary | ICD-10-CM

## 2012-06-24 DIAGNOSIS — J984 Other disorders of lung: Secondary | ICD-10-CM

## 2012-06-24 DIAGNOSIS — I739 Peripheral vascular disease, unspecified: Secondary | ICD-10-CM

## 2012-06-24 DIAGNOSIS — N259 Disorder resulting from impaired renal tubular function, unspecified: Secondary | ICD-10-CM

## 2012-06-24 DIAGNOSIS — I10 Essential (primary) hypertension: Secondary | ICD-10-CM

## 2012-06-24 DIAGNOSIS — E785 Hyperlipidemia, unspecified: Secondary | ICD-10-CM

## 2012-06-24 DIAGNOSIS — I5032 Chronic diastolic (congestive) heart failure: Secondary | ICD-10-CM

## 2012-06-24 LAB — CBC WITH DIFFERENTIAL/PLATELET
Eosinophils Absolute: 0.1 10*3/uL (ref 0.0–0.7)
HCT: 42.5 % (ref 39.0–52.0)
Lymphs Abs: 1.1 10*3/uL (ref 0.7–4.0)
MCHC: 33.2 g/dL (ref 30.0–36.0)
MCV: 85.5 fl (ref 78.0–100.0)
Monocytes Absolute: 0.7 10*3/uL (ref 0.1–1.0)
Neutrophils Relative %: 72.2 % (ref 43.0–77.0)
Platelets: 141 10*3/uL — ABNORMAL LOW (ref 150.0–400.0)

## 2012-06-24 LAB — HEPATIC FUNCTION PANEL
Albumin: 3.7 g/dL (ref 3.5–5.2)
Total Bilirubin: 0.6 mg/dL (ref 0.3–1.2)

## 2012-06-24 LAB — BASIC METABOLIC PANEL
BUN: 27 mg/dL — ABNORMAL HIGH (ref 6–23)
CO2: 30 mEq/L (ref 19–32)
Chloride: 104 mEq/L (ref 96–112)
Glucose, Bld: 101 mg/dL — ABNORMAL HIGH (ref 70–99)
Potassium: 4.6 mEq/L (ref 3.5–5.1)

## 2012-06-24 LAB — LIPID PANEL
Cholesterol: 159 mg/dL (ref 0–200)
HDL: 36.9 mg/dL — ABNORMAL LOW (ref >39.00)
Triglycerides: 174 mg/dL — ABNORMAL HIGH (ref 0.0–149.0)
VLDL: 34.8 mg/dL (ref 0.0–40.0)

## 2012-06-24 LAB — TSH: TSH: 0.68 u[IU]/mL (ref 0.35–5.50)

## 2012-06-25 NOTE — Assessment & Plan Note (Signed)
Repeat blood pressure was better than initial blood pressure as measured by CMA. We'll continue current medication. Encouraged medication compliance.

## 2012-06-25 NOTE — Progress Notes (Signed)
Patient ID: Harold [name], male   DOB: November 24, 1928, 79 y.o.   MRN: [id number] Patient comes in with a friend today. Neither the patient nor his wife drive any longer. Patient has multiple medical problems but has been doing relatively well. He states that he is compliant with his medications. The friend who is with him states that it's impossible for the patient to be compliant because she takes his medication box to be filled and are frequently medications that are not taken. In fact she states that he probably takes very little of his prescribed medications.  Coronary artery disease: Patient denies any specific complaints.  Renal insufficiency: Patient needs followup.  Hypertension: Patient does not check blood pressures at home.  Hyperlipidemia: Patient needs followup.  I have reviewed past medical history, social history, surgical history, medications.   patient denies chest pain, shortness of breath, orthopnea. Denies lower extremity edema, abdominal pain, change in appetite, change in bowel movements. Patient denies rashes, musculoskeletal complaints. No other specific complaints in a complete review of systems.   Physical exam: Elderly male in no acute distress. Recheck blood pressure at 142/86. HEENT exam atraumatic, normocephalic. Neck is somewhat restricted in movement but no jugular venous distention. Chest clear to auscultation cardiac exam S1-S2 are regular. Abdominal exam active bowel sounds, soft. Extremities trace edema at the ankles.

## 2012-06-25 NOTE — Assessment & Plan Note (Signed)
Patient states he has occasional flare up of gout. There's no frequent flareups and no current flare. Discussed the possibility of allopurinol. It's worth noting a previous uric acid level was somewhat elevated. I would prefer to not use medication if possible.

## 2012-06-25 NOTE — Assessment & Plan Note (Signed)
We'll check labs today.

## 2012-06-25 NOTE — Assessment & Plan Note (Signed)
The patient has no current symptoms. We'll continue current medications. It's quite unclear how compliant he is with his medications. I stressed to him the need to be compliant.

## 2012-06-25 NOTE — Assessment & Plan Note (Signed)
No current symptoms. He does have some lower extremity edema but this is chronic and mild. I don't think any further evaluation or change in medication is necessary at this time. It's possible that has minimal lower surgery edema is related to amlodipine. As stated previously is unclear how compliant he is with medications.

## 2012-07-15 ENCOUNTER — Telehealth: Payer: Self-pay | Admitting: Internal Medicine

## 2012-07-15 DIAGNOSIS — R350 Frequency of micturition: Secondary | ICD-10-CM

## 2012-07-15 NOTE — Telephone Encounter (Signed)
Pt has had frequent urination. Pt went urologist, was only able to see the Nurse Practicioner. They could not find anything wrong. He is still having this problem.  Wants either a referral to another urologist or if you thinkt he needs to bring in specimen so that we may check out. Pls advise.

## 2012-07-16 ENCOUNTER — Other Ambulatory Visit: Payer: Self-pay | Admitting: Cardiology

## 2012-07-16 NOTE — Telephone Encounter (Signed)
Please advise 

## 2012-07-17 NOTE — Telephone Encounter (Signed)
Ok to refer to [facility]

## 2012-07-17 NOTE — Telephone Encounter (Signed)
Referral order placed.

## 2012-08-14 ENCOUNTER — Other Ambulatory Visit: Payer: Self-pay | Admitting: Cardiology

## 2012-08-27 ENCOUNTER — Other Ambulatory Visit: Payer: Self-pay | Admitting: Cardiology

## 2012-09-12 ENCOUNTER — Other Ambulatory Visit: Payer: Self-pay | Admitting: Cardiology

## 2012-09-17 ENCOUNTER — Ambulatory Visit: Payer: Medicare Other | Admitting: Cardiology

## 2012-09-26 ENCOUNTER — Encounter: Payer: Self-pay | Admitting: Nurse Practitioner

## 2012-09-26 ENCOUNTER — Ambulatory Visit (INDEPENDENT_AMBULATORY_CARE_PROVIDER_SITE_OTHER): Payer: Medicare Other | Admitting: Nurse Practitioner

## 2012-09-26 VITALS — BP 138/70 | HR 84 | Ht 70.5 in | Wt 187.1 lb

## 2012-09-26 DIAGNOSIS — I5032 Chronic diastolic (congestive) heart failure: Secondary | ICD-10-CM

## 2012-09-26 NOTE — Patient Instructions (Addendum)
I will see you in 4 months  Continue with your current medicines - show this list to your pharmacist  Call the [facility] office at [phone] if you have any questions, problems or concerns.

## 2012-09-26 NOTE — Progress Notes (Signed)
[name] Date of Birth: 1928-12-07 Medical Record #[id number]  History of Present Illness: Mr. Forse is seen back today for a 6 month check. Seen for Dr. Shirlee Latch. He has multiple medical issues which include CAD, s/p CABG in 1998 and significant diastolic heart failure. Last cath was in 2010 showing his grafts to be occluded except for the LIMA to the LAD. Not a candidate for intervention or redo surgery. EF was 55% with moderate LVH per echo in 2013. 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 Other issues include prior AAA repair, CKD and advanced age.   Seen back in the fall at the request of the [facility] pharmacist regarding his medicines. We got those issues worked out and he was doing well the last time he was here.   Comes in today. Here alone. Doing ok. He tells me that he is doing ok. No chest pain. Gets short of breath if he exerts too much. As a general rule he is pretty inactive due to his blindness. No swelling. Weight is down 12 pounds since I last saw him in October. Tells me he has cut back on his eating and that this is intentional. His wife is no longer overseeing his medicines - just the pharmacist at [facility]. His wife has become "mentally challenged" per his report. His only complaint is of gout in his big toe. He is planning on calling Dr. Cato Mulligan again.   Current Outpatient Prescriptions on File Prior to Visit  Medication Sig Dispense Refill  . ALPRAZolam (XANAX) 0.5 MG tablet Take 1 tablet (0.5 mg total) by mouth 2 (two) times daily as needed.  60 tablet  2  . amLODipine (NORVASC) 10 MG tablet TAKE 1 TABLET BY MOUTH ONCE DAILY  30 tablet  6  . buPROPion (WELLBUTRIN XL) 150 MG 24 hr tablet TAKE 1 TABLET BY MOUTH TWICE A DAY  60 tablet  10  . carvedilol (COREG) 12.5 MG tablet TAKE 1 AND 1/2 TABLETS BYMOUTH TWICE A DAY  90 tablet  6  . furosemide (LASIX) 80 MG tablet TAKE 1 TABLET BY MOUTH TWICE A DAY  60 tablet  6  .  isosorbide mononitrate (IMDUR) 30 MG 24 hr tablet Take 1 tablet (30 mg total) by mouth daily.  30 tablet  8  . losartan (COZAAR) 50 MG tablet TAKE 1 TABLET BY MOUTH ONCE A DAY  30 tablet  6  . multivitamin-lutein (OCUVITE-LUTEIN) CAPS Take 1 capsule by mouth daily.      . nitroGLYCERIN (NITROSTAT) 0.4 MG SL tablet Place 0.4 mg under the tongue every 5 (five) minutes as needed.        . potassium chloride SA (K-DUR,KLOR-CON) 20 MEQ tablet TAKE 1 TABLET BY MOUTH DAILY.  30 tablet  6  . simvastatin (ZOCOR) 20 MG tablet Take 1 tablet (20 mg total) by mouth daily.  30 tablet  10   No current facility-administered medications on file prior to visit.    No Known Allergies  Past Medical History  Diagnosis Date  . CAD (coronary artery disease)     no known MImultiple PCIsall vein grafts occluded, LIMA patent by cath 08/2008  . FUO (fever of unknown origin) 1994    resolved---unclear etiology  . Gout   . Macular degeneration     followed at [facility]  . PVD (peripheral vascular disease)   . OSA (obstructive sleep apnea)     using CPAP  . Diastolic  heart failure     ef 55% echo 08/2008  . Hypertension   . Hyperlipidemia   . Renal insufficiency   . Depression     symptoms  . CKD (chronic kidney disease), stage II     with a glomerular fitration rate of 37 at discharge   . Renal adenoma     hx  . Nephrolithiasis     hx   . Hx of skin cancer, basal cell   . History of echocardiogram 5/10    EF 55%  . AAA (abdominal aortic aneurysm) 2002    3.2 cm, with repair  . History of tobacco use     Remote (same for alcohol use)    Past Surgical History  Procedure Laterality Date  . Abdominal aortic aneurysm repair    . Transurethral resection of prostate    . Coronary artery bypass graft  1990    s/p multiple PCIs with occlusion of al SVGs and only patnet LIMA at cath 5/10   . Cholecystectomy    . Lumbar disc surgery      x5  . Coronary stent placement      multiple; 12/02  . Back surgery     . Prostate surgery      History  Smoking status  . Former Smoker -- 2.00 packs/day for 52 years  . Quit date: 04/30/1988  Smokeless tobacco  . Not on file    Comment: smoked x52 years; up to 2 ppd     History  Alcohol Use No    Family History  Problem Relation Age of Onset  . Emphysema Father   . Heart disease Paternal Uncle     x2    Review of Systems: The review of systems is per the HPI.  All other systems were reviewed and are negative.  Physical Exam: BP 138/70  Pulse 84  Ht 5' 10.5" (1.791 m)  Wt 187 lb 1.9 oz (84.877 kg)  BMI 26.46 kg/m2 Patient is very pleasant and in no acute distress. He is thinner. Skin is warm and dry. Color is normal.  HEENT is unremarkable. Normocephalic/atraumatic. PERRL. Sclera are nonicteric. Neck is supple. No masses. No JVD. Lungs are clear. Cardiac exam shows a regular rate and rhythm. Soft murmur.  Abdomen is soft. Extremities are without edema. Gait and ROM are intact. No gross neurologic deficits noted.   LABORATORY DATA:  Lab Results  Component Value Date   WBC 7.3 06/24/2012   HGB 14.1 06/24/2012   HCT 42.5 06/24/2012   PLT 141.0* 06/24/2012   GLUCOSE 101* 06/24/2012   CHOL 159 06/24/2012   TRIG 174.0* 06/24/2012   HDL 36.90* 06/24/2012   LDLDIRECT 99.1 10/03/2007   LDLCALC 87 06/24/2012   ALT 9 06/24/2012   AST 19 06/24/2012   NA 140 06/24/2012   K 4.6 06/24/2012   CL 104 06/24/2012   CREATININE 1.8* 06/24/2012   BUN 27* 06/24/2012   CO2 30 06/24/2012   TSH 0.68 06/24/2012   INR 1.3 09/06/2008     Assessment / Plan: 1. Chronic diastolic heart failure - looks compensated today. Weight is down and he says this is intentional. Not dizzy or lightheaded. BP looks ok. I will see him back in about 4 months.   2. Legally blind - from macular degeneration  3. CAD - no chest pain reported.   4. History of noncompliance  Patient is agreeable to this plan and will call if any problems develop in the interim.  Rosalio Macadamia, RN,  ANP-C [facility] [address] [place], [place]  [address]

## 2012-10-09 ENCOUNTER — Other Ambulatory Visit: Payer: Self-pay | Admitting: Cardiology

## 2012-10-13 ENCOUNTER — Other Ambulatory Visit: Payer: Self-pay | Admitting: *Deleted

## 2012-10-13 DIAGNOSIS — F419 Anxiety disorder, unspecified: Secondary | ICD-10-CM

## 2012-10-13 MED ORDER — ALPRAZOLAM 0.5 MG PO TABS: 0.5000 mg | ORAL_TABLET | Freq: Two times a day (BID) | ORAL | Status: DC | PRN

## 2012-10-17 ENCOUNTER — Other Ambulatory Visit: Payer: Self-pay | Admitting: Cardiology

## 2012-11-07 ENCOUNTER — Other Ambulatory Visit: Payer: Self-pay | Admitting: Cardiology

## 2012-11-21 ENCOUNTER — Telehealth: Payer: Self-pay | Admitting: Internal Medicine

## 2012-11-21 NOTE — Telephone Encounter (Signed)
Ok to see [employer] Cbc, bmet, lfts, lipid panel, tsh

## 2012-11-21 NOTE — Telephone Encounter (Signed)
What labs would you like drawn?

## 2012-11-21 NOTE — Telephone Encounter (Signed)
Pt would like to schedule labs, but would like to come in prior to the first appt w/ Dr sword in Oct.  Also would like to discuss a referral to dr Gaynelle Arabian for prostates concerns.

## 2012-11-21 NOTE — Telephone Encounter (Signed)
No answer, no machine.

## 2012-11-24 NOTE — Telephone Encounter (Signed)
No answer, no machine.

## 2012-11-25 NOTE — Telephone Encounter (Signed)
Pt aware, said he would call back

## 2012-11-25 NOTE — Telephone Encounter (Signed)
PT called to inquire about time of lab appt, I explained that it had not been made yet, but needed to be. He stated that he would call back. FYI.

## 2012-12-18 ENCOUNTER — Encounter: Payer: Self-pay | Admitting: Cardiovascular Disease

## 2012-12-18 ENCOUNTER — Encounter: Payer: Self-pay | Admitting: Cardiology

## 2012-12-30 ENCOUNTER — Ambulatory Visit: Payer: Medicare Other | Admitting: Nurse Practitioner

## 2013-01-13 ENCOUNTER — Other Ambulatory Visit: Payer: Self-pay | Admitting: Nurse Practitioner

## 2013-01-13 ENCOUNTER — Ambulatory Visit (INDEPENDENT_AMBULATORY_CARE_PROVIDER_SITE_OTHER): Payer: Medicare Other | Admitting: Nurse Practitioner

## 2013-01-13 ENCOUNTER — Encounter: Payer: Self-pay | Admitting: Nurse Practitioner

## 2013-01-13 VITALS — BP 160/86 | HR 80 | Ht 70.5 in | Wt 194.0 lb

## 2013-01-13 DIAGNOSIS — I5032 Chronic diastolic (congestive) heart failure: Secondary | ICD-10-CM

## 2013-01-13 LAB — BASIC METABOLIC PANEL
BUN: 32 mg/dL — ABNORMAL HIGH (ref 6–23)
CO2: 27 mEq/L (ref 19–32)
Calcium: 8.9 mg/dL (ref 8.4–10.5)
Chloride: 106 mEq/L (ref 96–112)
Creatinine, Ser: 1.9 mg/dL — ABNORMAL HIGH (ref 0.4–1.5)
GFR: 37.41 mL/min — ABNORMAL LOW (ref >60.00)
Glucose, Bld: 115 mg/dL — ABNORMAL HIGH (ref 70–99)
Potassium: 3.9 mEq/L (ref 3.5–5.1)
Sodium: 139 mEq/L (ref 135–145)

## 2013-01-13 MED ORDER — FUROSEMIDE 80 MG PO TABS: ORAL_TABLET | ORAL | Status: DC

## 2013-01-13 NOTE — Addendum Note (Signed)
Addended by: Rosalio Macadamia on: 01/13/2013 03:33 PM   Modules accepted: Orders

## 2013-01-13 NOTE — Progress Notes (Signed)
Harold [name] Date of Birth: 1928/12/20 Medical Record #[id number]  History of Present Illness: Harold [name] is seen back today for a 4 month check. Seen for Dr. Shirlee Latch. He has multiple medical issues which include CAD, s/p CABG in 1998 and significant diastolic heart failure. Last cath was in 2010 showing his grafts to be occluded except for the LIMA to the LAD. Not a candidate for intervention or redo surgery. EF was 55% with moderate LVH per echo in 2013. 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 Other issues include prior AAA repair, CKD and advanced age.   Seen back in the fall of 2013 at the request of the [facility] pharmacist regarding his medicines. We got those issues worked out and he was doing well the last time he was here but his wife was no longer overseeing his medicines - just the pharmacist at [facility]. His wife had become "mentally challenged" per his report.   Comes back today. Here alone. Not doing as well. Weight going back up. No chest pain. Stable DOE. More swelling. Lots of stress with his wife - apparently she has dementia - getting up at all hours of the night. Has to have all meals delivered - probably getting too much salt. Did not take his medicines today. Forgot to give his wife her medicines yesterday. I think compliance is still an issue.  Pharmacist at [facility] still fixing his pill box every Friday. Doesn't really have anyone that helps. Neither he or his wife driving. Children not really helping out. She will be going to [place] soon for an extended stay with her daughter.   Current Outpatient Prescriptions  Medication Sig Dispense Refill  . ALPRAZolam (XANAX) 0.5 MG tablet Take 1 tablet (0.5 mg total) by mouth 2 (two) times daily as needed.  60 tablet  2  . amLODipine (NORVASC) 10 MG tablet TAKE 1 TABLET BY MOUTH ONCE DAILY  30 tablet  6  . buPROPion (WELLBUTRIN XL) 150 MG 24 hr tablet TAKE 1 TABLET BY MOUTH TWICE A DAY  60 tablet   10  . carvedilol (COREG) 12.5 MG tablet TAKE 1 AND 1/2 TABLETS BYMOUTH TWICE A DAY  90 tablet  6  . furosemide (LASIX) 80 MG tablet TAKE 1 1/2  TABLET BY MOUTH IN THE AM AND 1 TABLET IN THE AFTERNOONS  90 tablet  6  . isosorbide mononitrate (IMDUR) 30 MG 24 hr tablet TAKE 1 TABLET BY MOUTH ONCE DAILY  30 tablet  8  . losartan (COZAAR) 50 MG tablet TAKE 1 TABLET BY MOUTH ONCE A DAY  30 tablet  6  . multivitamin-lutein (OCUVITE-LUTEIN) CAPS Take 1 capsule by mouth daily.      . nitroGLYCERIN (NITROSTAT) 0.4 MG SL tablet Place 0.4 mg under the tongue every 5 (five) minutes as needed.        . potassium chloride SA (K-DUR,KLOR-CON) 20 MEQ tablet TAKE 1 TABLET BY MOUTH DAILY.  30 tablet  6  . simvastatin (ZOCOR) 20 MG tablet TAKE 1 TABLET BY MOUTH ONCE DAILY  30 tablet  10   No current facility-administered medications for this visit.    No Known Allergies  Past Medical History  Diagnosis Date  . CAD (coronary artery disease)     no known MImultiple PCIsall vein grafts occluded, LIMA patent by cath 08/2008  . FUO (fever of unknown origin) 1994    resolved---unclear etiology  . Gout   . Macular degeneration  followed at [facility]  . PVD (peripheral vascular disease)   . OSA (obstructive sleep apnea)     using CPAP  . Diastolic heart failure     ef 16% echo 08/2008  . Hypertension   . Hyperlipidemia   . Renal insufficiency   . Depression     symptoms  . CKD (chronic kidney disease), stage II     with a glomerular fitration rate of 37 at discharge   . Renal adenoma     hx  . Nephrolithiasis     hx   . Hx of skin cancer, basal cell   . History of echocardiogram 5/10    EF 55%  . AAA (abdominal aortic aneurysm) 2002    3.2 cm, with repair  . History of tobacco use     Remote (same for alcohol use)    Past Surgical History  Procedure Laterality Date  . Abdominal aortic aneurysm repair    . Transurethral resection of prostate    . Coronary artery bypass graft  1990    s/p  multiple PCIs with occlusion of al SVGs and only patnet LIMA at cath 5/10   . Cholecystectomy    . Lumbar disc surgery      x5  . Coronary stent placement      multiple; 12/02  . Back surgery    . Prostate surgery      History  Smoking status  . Former Smoker -- 2.00 packs/day for 52 years  . Quit date: 04/30/1988  Smokeless tobacco  . Not on file    Comment: smoked x52 years; up to 2 ppd     History  Alcohol Use No    Family History  Problem Relation Age of Onset  . Emphysema Father   . Heart disease Paternal Uncle     x2    Review of Systems: The review of systems is per the HPI.  All other systems were reviewed and are negative.  Physical Exam: BP 160/86  Pulse 80  Ht 5' 10.5" (1.791 m)  Wt 194 lb (87.998 kg)  BMI 27.43 kg/m2 Patient is very pleasant and in no acute distress. Skin is warm and dry. Color is normal.  HEENT is unremarkable. Normocephalic/atraumatic. PERRL. Sclera are nonicteric. Neck is supple. No masses. No JVD. Lungs are clear. Cardiac exam shows a regular rate and rhythm. Abdomen is soft. Extremities are without edema. Gait and ROM are intact. No gross neurologic deficits noted.  LABORATORY DATA: PENDING   Lab Results  Component Value Date   WBC 7.3 06/24/2012   HGB 14.1 06/24/2012   HCT 42.5 06/24/2012   PLT 141.0* 06/24/2012   GLUCOSE 101* 06/24/2012   CHOL 159 06/24/2012   TRIG 174.0* 06/24/2012   HDL 36.90* 06/24/2012   LDLDIRECT 99.1 10/03/2007   LDLCALC 87 06/24/2012   ALT 9 06/24/2012   AST 19 06/24/2012   NA 140 06/24/2012   K 4.6 06/24/2012   CL 104 06/24/2012   CREATININE 1.8* 06/24/2012   BUN 27* 06/24/2012   CO2 30 06/24/2012   TSH 0.68 06/24/2012   INR 1.3 09/06/2008     Assessment / Plan: 1. Chronic diastolic heart failure - weight is up. More swelling of his lower extremities. Will increase his Lasix to 120 mg in the am and 80 mg in the pm. Compliance is still an issue. He is certainly getting too much salt with having to order all  take out for meals.   2. Legally  blind  3. CAD - no chest pain reported.   4. HTN - BP is up - he has not had medicines today.   5. History of noncompliance - still an issue.   6. Poor social situation  We will check labs today. I will see him in a month. He will alert the pharmacist as to the medicine changes on Friday when she fills up his box.   Patient is agreeable to this plan and will call if any problems develop in the interim.   Rosalio Macadamia, RN, ANP-C [facility] [address] [place], [place]  [address]

## 2013-01-13 NOTE — Patient Instructions (Addendum)
Stay on your current medicines but increase the Lasix to 1 1/2 tablets (120mg ) in the am and 80 mg in the afternoons  I will see you in a month - take your medicines that day  We need to check labs today  Call the [facility] office at [phone] if you have any questions, problems or concerns.

## 2013-01-16 ENCOUNTER — Telehealth: Payer: Self-pay | Admitting: *Deleted

## 2013-01-16 NOTE — Telephone Encounter (Signed)
S/w Shawna Orleans the pharmacist at [facility] to ask about pt's lasix's Lawson Fiscal increased medication to one and one half tablet ( 120 mg) in the am and one tablet ( 80 mg ) in the pm. The pharmacist stated pt hasn't taken but 5 lasix's in the last two weeks and stated do we need to increase the medication. Lawson Fiscal stated just keep pt on ( 80 mg ) of lasix bid. Pharmacist agreeable to plan.

## 2013-01-19 ENCOUNTER — Telehealth: Payer: Self-pay | Admitting: *Deleted

## 2013-01-19 NOTE — Telephone Encounter (Signed)
Harold [name], See last note on [employer]. Could you see if we can get him a home health RN (and if he would accept one)? ----- Message ----- Sent: 01/13/2013 3:30 PM  Laurey Morale, MD  Nurse to help with medication administration, weight, monitoring BP etc ----- Message ----- From: Jacqlyn Krauss, RN Sent: 01/14/2013 12:33 PM To: Laurey Morale, MD What home health services are you requesting? Thanks Harold [name]  01/19/13--spoke with patient and offered to make referral for Home Health services per Dr Shirlee Latch, pt declined. He said he would call me back if he decided he would like referral for Home Health Services.

## 2013-01-28 ENCOUNTER — Other Ambulatory Visit: Payer: Self-pay | Admitting: Cardiology

## 2013-02-10 ENCOUNTER — Ambulatory Visit: Payer: Medicare Other | Admitting: Nurse Practitioner

## 2013-02-16 ENCOUNTER — Ambulatory Visit: Payer: Medicare Other | Admitting: Nurse Practitioner

## 2013-05-20 ENCOUNTER — Telehealth: Payer: Self-pay | Admitting: Internal Medicine

## 2013-05-20 DIAGNOSIS — J329 Chronic sinusitis, unspecified: Secondary | ICD-10-CM

## 2013-05-20 NOTE — Telephone Encounter (Signed)
Pt would like a referral to ENT, he has sinus drainage.

## 2013-05-21 NOTE — Telephone Encounter (Signed)
Pt following up on referral request, pt is having issues w/ sinus and would like asap

## 2013-05-22 NOTE — Telephone Encounter (Signed)
Referral order placed.

## 2013-05-22 NOTE — Telephone Encounter (Signed)
Ok with me Have him call dr Pollie Friar office

## 2013-05-22 NOTE — Telephone Encounter (Signed)
Pt calling to fu on referral. Pt would like to go asap. Pt is going to call dr Pollie Friar office to sch his appt

## 2013-07-07 ENCOUNTER — Telehealth: Payer: Self-pay | Admitting: Nurse Practitioner

## 2013-07-07 NOTE — Telephone Encounter (Signed)
S/w pt requested to move appointment up due to swelling I moved pt appointment pt agreeable

## 2013-07-07 NOTE — Telephone Encounter (Signed)
New problem   Pt is having feet swelling really bad and want a sooner appt and need to speak to nurse concerning this matter. Please call pt.

## 2013-07-13 ENCOUNTER — Encounter: Payer: Self-pay | Admitting: Nurse Practitioner

## 2013-07-13 ENCOUNTER — Other Ambulatory Visit: Payer: Self-pay | Admitting: *Deleted

## 2013-07-13 ENCOUNTER — Ambulatory Visit (INDEPENDENT_AMBULATORY_CARE_PROVIDER_SITE_OTHER): Payer: Medicare Other | Admitting: Nurse Practitioner

## 2013-07-13 VITALS — BP 180/90 | HR 71 | Ht 70.5 in | Wt 193.4 lb

## 2013-07-13 DIAGNOSIS — I10 Essential (primary) hypertension: Secondary | ICD-10-CM

## 2013-07-13 DIAGNOSIS — E785 Hyperlipidemia, unspecified: Secondary | ICD-10-CM

## 2013-07-13 DIAGNOSIS — R609 Edema, unspecified: Secondary | ICD-10-CM

## 2013-07-13 LAB — BASIC METABOLIC PANEL
BUN: 31 mg/dL — ABNORMAL HIGH (ref 6–23)
CO2: 31 mEq/L (ref 19–32)
Calcium: 9.2 mg/dL (ref 8.4–10.5)
Chloride: 105 mEq/L (ref 96–112)
Creatinine, Ser: 2 mg/dL — ABNORMAL HIGH (ref 0.4–1.5)
GFR: 35.16 mL/min — ABNORMAL LOW (ref >60.00)
Glucose, Bld: 88 mg/dL (ref 70–99)
Potassium: 4.2 mEq/L (ref 3.5–5.1)
Sodium: 141 mEq/L (ref 135–145)

## 2013-07-13 LAB — LIPID PANEL
Cholesterol: 124 mg/dL (ref 0–200)
HDL: 36.9 mg/dL — ABNORMAL LOW (ref >39.00)
LDL Cholesterol: 67 mg/dL (ref 0–99)
Total CHOL/HDL Ratio: 3
Triglycerides: 99 mg/dL (ref 0.0–149.0)
VLDL: 19.8 mg/dL (ref 0.0–40.0)

## 2013-07-13 LAB — CBC
HCT: 39 % (ref 39.0–52.0)
Hemoglobin: 12.8 g/dL — ABNORMAL LOW (ref 13.0–17.0)
MCHC: 32.8 g/dL (ref 30.0–36.0)
MCV: 86.8 fl (ref 78.0–100.0)
Platelets: 154 10*3/uL (ref 150.0–400.0)
RBC: 4.49 Mil/uL (ref 4.22–5.81)
RDW: 16.3 % — ABNORMAL HIGH (ref 11.5–14.6)
WBC: 7.1 10*3/uL (ref 4.5–10.5)

## 2013-07-13 LAB — HEPATIC FUNCTION PANEL
ALT: 10 U/L (ref 0–53)
AST: 21 U/L (ref 0–37)
Albumin: 3.9 g/dL (ref 3.5–5.2)
Alkaline Phosphatase: 57 U/L (ref 39–117)
Bilirubin, Direct: 0.1 mg/dL (ref 0.0–0.3)
Total Bilirubin: 1 mg/dL (ref 0.3–1.2)
Total Protein: 6.4 g/dL (ref 6.0–8.3)

## 2013-07-13 MED ORDER — FLUTICASONE PROPIONATE 50 MCG/ACT NA SUSP: 2.0000 | Freq: Every day | NASAL | Status: DC

## 2013-07-13 MED ORDER — TRIAMCINOLONE ACETONIDE 0.1 % EX OINT: 1.0000 "application " | TOPICAL_OINTMENT | Freq: Two times a day (BID) | CUTANEOUS | Status: DC

## 2013-07-13 NOTE — Progress Notes (Addendum)
Harold [name] Date of Birth: April 07, 1929 Medical Record [id number]  History of Present Illness: Harold [name] is seen back today for a follow up check. Seen for Dr. Aundra [name]. He has multiple medical issues which include CAD, s/p CABG in 1998 and significant diastolic heart failure. Last cath was in 2010 showing his grafts to be occluded except for the LIMA to the LAD. Not a candidate for intervention or redo surgery. EF was 55% with moderate LVH per echo in 2013. 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 Other issues include prior AAA repair, CKD and advanced age.   Seen back in the fall of 2013 at the request of the [facility] pharmacist regarding his medicines. We got those issues worked out and he was doing well the last time he was here but his wife was no longer overseeing his medicines - just the pharmacist at [facility] [employer]. His wife had become "mentally challenged" per his report.   I saw him last September of 2014 with more swelling. Lots of stress with his wife - apparently she has dementia - getting up at all hours of the night. Has to have all meals delivered - probably getting too much salt. Very difficult social situation.   Comes back today. Did not take his medicines again today. Now getting his pill box filled every other week by the Pharmacist at [facility]. Just hired someone to [employer]. Very vague about who is helping him out at home. Wife remains demented - seems to be getting worse. Eating lots of take out or eating out. Admits that he is not taking his medicines as prescribed. Sometimes only taking once a day. More swelling - wants to know what we are going to do about it. BP quite high. Adamant that he does not need to be looking into an assisted living type arrangement.    Current Outpatient Prescriptions  Medication Sig Dispense Refill  . ALPRAZolam (XANAX) 0.5 MG tablet Take 1 tablet (0.5 mg total) by mouth 2 (two) times daily as needed.  60 tablet  2  .  amLODipine (NORVASC) 10 MG tablet TAKE 1 TABLET BY MOUTH ONCE DAILY  30 tablet  6  . buPROPion (WELLBUTRIN XL) 150 MG 24 hr tablet TAKE 1 TABLET BY MOUTH TWICE A DAY  60 tablet  10  . carvedilol (COREG) 12.5 MG tablet TAKE 1 AND 1/2 TABLETS BYMOUTH TWICE A DAY  90 tablet  6  . furosemide (LASIX) 80 MG tablet TAKE 1 1/2  TABLET BY MOUTH IN THE AM AND 1 TABLET IN THE AFTERNOONS  90 tablet  6  . isosorbide mononitrate (IMDUR) 30 MG 24 hr tablet TAKE 1 TABLET BY MOUTH ONCE DAILY  30 tablet  8  . losartan (COZAAR) 50 MG tablet TAKE 1 TABLET BY MOUTH ONCE A DAY  30 tablet  6  . multivitamin-lutein (OCUVITE-LUTEIN) CAPS Take 1 capsule by mouth daily.      . nitroGLYCERIN (NITROSTAT) 0.4 MG SL tablet Place 0.4 mg under the tongue every 5 (five) minutes as needed.        . potassium chloride (K-DUR) 10 MEQ tablet TAKE 2 TABLETS BY MOUTH ONCE A DAY  60 tablet  6  . potassium chloride SA (K-DUR,KLOR-CON) 20 MEQ tablet TAKE 1 TABLET BY MOUTH DAILY.  30 tablet  6  . simvastatin (ZOCOR) 20 MG tablet TAKE 1 TABLET BY MOUTH ONCE DAILY  30 tablet  10   No current facility-administered medications  for this visit.    No Known Allergies  Past Medical History  Diagnosis Date  . CAD (coronary artery disease)     no known MImultiple PCIsall vein grafts occluded, LIMA patent by cath 08/2008  . FUO (fever of unknown origin) 1994    resolved---unclear etiology  . Gout   . Macular degeneration     followed at [facility]  . PVD (peripheral vascular disease)   . OSA (obstructive sleep apnea)     using CPAP  . Diastolic heart failure     ef 55% echo 08/2008  . Hypertension   . Hyperlipidemia   . Renal insufficiency   . Depression     symptoms  . CKD (chronic kidney disease), stage II     with a glomerular fitration rate of 37 at discharge   . Renal adenoma     hx  . Nephrolithiasis     hx   . Hx of skin cancer, basal cell   . History of echocardiogram 5/10    EF 55%  . AAA (abdominal aortic aneurysm) 2002     3.2 cm, with repair  . History of tobacco use     Remote (same for alcohol use)    Past Surgical History  Procedure Laterality Date  . Abdominal aortic aneurysm repair    . Transurethral resection of prostate    . Coronary artery bypass graft  1990    s/p multiple PCIs with occlusion of al SVGs and only patnet LIMA at cath 5/10   . Cholecystectomy    . Lumbar disc surgery      x5  . Coronary stent placement      multiple; 12/02  . Back surgery    . Prostate surgery      History  Smoking status  . Former Smoker -- 2.00 packs/day for 52 years  . Quit date: 04/30/1988  Smokeless tobacco  . Not on file    Comment: smoked x52 years; up to 2 ppd     History  Alcohol Use No    Family History  Problem Relation Age of Onset  . Emphysema Father   . Heart disease Paternal Uncle     x2    Review of Systems: The review of systems is per the HPI.  All other systems were reviewed and are negative.  Physical Exam: BP 180/90  Pulse 71  Ht 5' 10.5" (1.791 m)  Wt 193 lb 6.4 oz (87.726 kg)  BMI 27.35 kg/m2  SpO2 95% Patient is alert and in no acute distress. Seems to be fairly alert and responsive. Skin is warm and dry. Color is normal.  HEENT is unremarkable. Normocephalic/atraumatic. PERRL. Sclera are nonicteric. Neck is supple. No masses. No JVD. Lungs are clear. Cardiac exam shows a regular rate and rhythm. Abdomen is soft. Extremities are with 1 to 2+ edema - up past his knees. Gait and ROM are intact. No gross neurologic deficits noted.  Wt Readings from Last 3 Encounters:  07/13/13 193 lb 6.4 oz (87.726 kg)  01/13/13 194 lb (87.998 kg)  09/26/12 187 lb 1.9 oz (84.877 kg)    LABORATORY DATA: PENDING  Lab Results  Component Value Date   WBC 7.3 06/24/2012   HGB 14.1 06/24/2012   HCT 42.5 06/24/2012   PLT 141.0* 06/24/2012   GLUCOSE 115* 01/13/2013   CHOL 159 06/24/2012   TRIG 174.0* 06/24/2012   HDL 36.90* 06/24/2012   LDLDIRECT 99.1 10/03/2007   LDLCALC 87 06/24/2012  ALT 9 06/24/2012   AST 19 06/24/2012   NA 139 01/13/2013   K 3.9 01/13/2013   CL 106 01/13/2013   CREATININE 1.9* 01/13/2013   BUN 32* 01/13/2013   CO2 27 01/13/2013   TSH 0.68 06/24/2012   INR 1.3 09/06/2008    Assessment / Plan: 1. Chronic diastolic heart failure - weight is not too bad - but more swelling on exam - not able to get his medicines changed until next Friday. Pharmacist that fills his box not back at work until Wednesday. We are really at a crossroads in how to take care of him. He cannot see. He does not take his medicines as prescribed. He is agreeable to seeing if [facility] can help. Refuses to think/consider assisted living options. I only see this situation getting worse.   2. Legally blind   3. CAD - no chest pain reported.   4. HTN - BP is up - he has not had medicines today. We called the pharmacist to clarify his potassium and even one of the fill in pharmacists noted that he does not take his medicines as prescribe.   5. History of noncompliance - still the primary issue - I do not see where this is going to change.   6. Poor social situation   We will check labs today.   Patient is agreeable to this plan and will call if any problems develop in the interim.   Burtis Junes, RN, [place] [address] [place], [place]  [id number] [phone]  Addendum: Glenetta Hew on 07/15/2013 - Pharmacist is off for the rest of the day - have left a message for her to call me Friday.  Received phone call back - message was taken and I did not get to speak to the pharmacist directly. Seems like all the pharmacy staff knows Mr. Harold [name]. They report that he continues to repeatedly bring his pill box in - continues to miss numerous pills - I do not think a change in medicines is warranted at this time when he is not able to take as directed. Hopefully [facility] will accept him but I still have my reservations as to whether this will impact  his care.   Addendum: 07/21/2013 - received fax from [facility] regarding his medicines. Pharmacist notes that compliance continues to be an issue. His medicines were suppose to be:  Lasix 80mg  BID  Norvasc 10 mg QPM  Coreg 12.5 mg  1 1/2 BID  KCL 10eq 2 pills QD  Bupropion 150mg  BID  Xanax 0.5 QPM  Zocor 20mg    Isosorbide 30mg  QD  Losartan 50 mg QD  I do not see a reason to change his medicines when he cannot take his medicines correctly. Overall prognosis tenuous at best.

## 2013-07-13 NOTE — Patient Instructions (Signed)
We will check labs today  I will call the pharmacist at [facility] about your medicines  I am going to try and refer you to the [facility]  Call the [place] office at [phone] if you have any questions, problems or concerns.

## 2013-07-21 NOTE — Telephone Encounter (Signed)
S/w [name] @ [phone] for Harold [name] pt was contacted on 3/23 couldn't speak at the time and stated would call [place] back, no call received yet, will route to [place]

## 2013-07-21 NOTE — Telephone Encounter (Signed)
S/W [name] @ [facility] will keep trying pt several times and send a letter, routing to [employer]

## 2013-07-21 NOTE — Telephone Encounter (Signed)
It may be fruitless, but can they keep trying?

## 2013-07-21 NOTE — Telephone Encounter (Signed)
Message copied by Tamsen Snider on Tue Jul 21, 2013 11:53 AM ------      Message from: Burtis Junes      Created: Fri Jul 17, 2013  7:43 AM       Can we find out if [facility] accepted him?            lori ------

## 2013-07-24 ENCOUNTER — Ambulatory Visit: Payer: Medicare Other | Admitting: Nurse Practitioner

## 2013-07-28 ENCOUNTER — Telehealth: Payer: Self-pay | Admitting: Nurse Practitioner

## 2013-07-28 NOTE — Telephone Encounter (Signed)
S/w Izora Gala, CNA that has been taking care of the pt for several months states has been reminding pt to take medications am and pm for several months.  Wanted to increase pt's lasix and wrap ankles with ace bandages.  S/w with Cecille Rubin and advised can wrap pt's ankles when gets there in the am and take off before leaves in the pm. Cecille Rubin was not agreeable on increasing lasix's till pt get ankles wrapped for a week, stated Izora Gala will call back to let know if swelling has decreased.   Also stated to call back before pt gets medication filled at [facility], one of the pharmacists fills pt's medication container every two weeks on Friday will let know if Cecille Rubin will increase lasix's. Requested a extra bottle to leave at the house but under the circumstances not a good idea, I explained that to the [place]. Will route to [place] for [employer]

## 2013-07-28 NOTE — Telephone Encounter (Signed)
New message     C/o feet swollen and pt is on lasix twice a day.  It is hard for the patient to walk.  EMS came last night because pt fell and could not get himself up.

## 2013-07-28 NOTE — Telephone Encounter (Signed)
Noted.  I think this is a very tenuous situation. He has no one actually administering his medicines. Numerous personnel at [facility] have said that multiple pills are in his box when he comes for refills. I do not see where trying to increase/change his medicine will help when he is not even able to take what is currently prescribed. His social situation needs to be addressed and unfortunately he is very resistant to this.

## 2013-07-30 ENCOUNTER — Other Ambulatory Visit: Payer: Self-pay | Admitting: Cardiology

## 2013-07-30 ENCOUNTER — Other Ambulatory Visit: Payer: Self-pay | Admitting: Internal Medicine

## 2013-08-04 ENCOUNTER — Other Ambulatory Visit: Payer: Self-pay | Admitting: Internal Medicine

## 2013-09-07 ENCOUNTER — Telehealth: Payer: Self-pay | Admitting: Internal Medicine

## 2013-09-07 NOTE — Telephone Encounter (Signed)
[facility] # [place], [place] is requesting re-fill on ALPRAZolam (XANAX) 0.5 MG tablet

## 2013-09-07 NOTE — Telephone Encounter (Signed)
LMOM for pt to schedule med check ov.  See below note from [place].

## 2013-09-07 NOTE — Telephone Encounter (Signed)
Pt will need an appt.

## 2013-09-25 ENCOUNTER — Observation Stay (HOSPITAL_COMMUNITY)
Admission: EM | Admit: 2013-09-25 | Discharge: 2013-09-28 | Disposition: A | Discharge status: Home / Self Care | Payer: Medicare Other | Attending: Cardiology | Admitting: Cardiology

## 2013-09-25 ENCOUNTER — Emergency Department (HOSPITAL_COMMUNITY): Payer: Medicare Other

## 2013-09-25 ENCOUNTER — Encounter (HOSPITAL_COMMUNITY): Payer: Self-pay | Admitting: Emergency Medicine

## 2013-09-25 ENCOUNTER — Telehealth: Payer: Self-pay | Admitting: Nurse Practitioner

## 2013-09-25 DIAGNOSIS — I739 Peripheral vascular disease, unspecified: Secondary | ICD-10-CM | POA: Insufficient documentation

## 2013-09-25 DIAGNOSIS — I5032 Chronic diastolic (congestive) heart failure: Secondary | ICD-10-CM

## 2013-09-25 DIAGNOSIS — G4730 Sleep apnea, unspecified: Secondary | ICD-10-CM

## 2013-09-25 DIAGNOSIS — E43 Unspecified severe protein-calorie malnutrition: Secondary | ICD-10-CM | POA: Insufficient documentation

## 2013-09-25 DIAGNOSIS — I5033 Acute on chronic diastolic (congestive) heart failure: Secondary | ICD-10-CM | POA: Diagnosis present

## 2013-09-25 DIAGNOSIS — I2510 Atherosclerotic heart disease of native coronary artery without angina pectoris: Secondary | ICD-10-CM | POA: Insufficient documentation

## 2013-09-25 DIAGNOSIS — N183 Chronic kidney disease, stage 3 unspecified: Secondary | ICD-10-CM | POA: Diagnosis present

## 2013-09-25 DIAGNOSIS — I10 Essential (primary) hypertension: Secondary | ICD-10-CM

## 2013-09-25 DIAGNOSIS — M109 Gout, unspecified: Secondary | ICD-10-CM | POA: Insufficient documentation

## 2013-09-25 DIAGNOSIS — G4733 Obstructive sleep apnea (adult) (pediatric): Secondary | ICD-10-CM | POA: Insufficient documentation

## 2013-09-25 DIAGNOSIS — I509 Heart failure, unspecified: Principal | ICD-10-CM | POA: Insufficient documentation

## 2013-09-25 DIAGNOSIS — H3530 Unspecified macular degeneration: Secondary | ICD-10-CM

## 2013-09-25 DIAGNOSIS — N289 Disorder of kidney and ureter, unspecified: Secondary | ICD-10-CM

## 2013-09-25 LAB — CBC
HCT: 37.3 % — ABNORMAL LOW (ref 39.0–52.0)
HEMOGLOBIN: 12.3 g/dL — AB (ref 13.0–17.0)
MCH: 28.4 pg (ref 26.0–34.0)
MCHC: 33 g/dL (ref 30.0–36.0)
MCV: 86.1 fL (ref 78.0–100.0)
PLATELETS: 195 10*3/uL (ref 150–400)
RBC: 4.33 MIL/uL (ref 4.22–5.81)
RDW: 15.8 % — ABNORMAL HIGH (ref 11.5–15.5)
WBC: 8.2 10*3/uL (ref 4.0–10.5)

## 2013-09-25 LAB — I-STAT TROPONIN, ED: Troponin i, poc: 0.01 ng/mL (ref 0.00–0.08)

## 2013-09-25 LAB — BASIC METABOLIC PANEL
BUN: 35 mg/dL — ABNORMAL HIGH (ref 6–23)
CALCIUM: 9.3 mg/dL (ref 8.4–10.5)
CHLORIDE: 99 meq/L (ref 96–112)
CO2: 26 mEq/L (ref 19–32)
CREATININE: 2.28 mg/dL — AB (ref 0.50–1.35)
GFR calc Af Amer: 29 mL/min — ABNORMAL LOW (ref >90)
GFR calc non Af Amer: 25 mL/min — ABNORMAL LOW (ref >90)
GLUCOSE: 91 mg/dL (ref 70–99)
Potassium: 4.4 mEq/L (ref 3.7–5.3)
SODIUM: 139 meq/L (ref 137–147)

## 2013-09-25 LAB — PRO B NATRIURETIC PEPTIDE: Pro B Natriuretic peptide (BNP): 3044 pg/mL — ABNORMAL HIGH (ref 0–450)

## 2013-09-25 LAB — I-STAT CG4 LACTIC ACID, ED: Lactic Acid, Venous: 1.62 mmol/L (ref 0.5–2.2)

## 2013-09-25 MED ORDER — SODIUM CHLORIDE 0.9 % IV SOLN: 250.0000 mL | INTRAVENOUS | Status: DC | PRN

## 2013-09-25 MED ORDER — SODIUM CHLORIDE 0.9 % IJ SOLN: 3.0000 mL | INTRAMUSCULAR | Status: DC | PRN

## 2013-09-25 MED ORDER — FUROSEMIDE 10 MG/ML IJ SOLN
80.0000 mg | Freq: Once | INTRAMUSCULAR | Status: AC
  Administered 2013-09-25: 80 mg via INTRAVENOUS
  Filled 2013-09-25: qty 8

## 2013-09-25 MED ORDER — FLUTICASONE PROPIONATE 50 MCG/ACT NA SUSP
2.0000 | Freq: Every day | NASAL | Status: DC
  Administered 2013-09-26 – 2013-09-28 (×3): 2 via NASAL
  Filled 2013-09-25: qty 16

## 2013-09-25 MED ORDER — HEPARIN SODIUM (PORCINE) 5000 UNIT/ML IJ SOLN
5000.0000 [IU] | Freq: Three times a day (TID) | INTRAMUSCULAR | Status: DC
  Administered 2013-09-26 – 2013-09-28 (×5): 5000 [IU] via SUBCUTANEOUS
  Filled 2013-09-25 (×10): qty 1

## 2013-09-25 MED ORDER — NITROGLYCERIN 0.4 MG SL SUBL: 0.4000 mg | SUBLINGUAL_TABLET | SUBLINGUAL | Status: DC | PRN

## 2013-09-25 MED ORDER — SODIUM CHLORIDE 0.9 % IJ SOLN
3.0000 mL | Freq: Two times a day (BID) | INTRAMUSCULAR | Status: DC
  Administered 2013-09-25 – 2013-09-27 (×5): 3 mL via INTRAVENOUS

## 2013-09-25 MED ORDER — BUPROPION HCL ER (XL) 150 MG PO TB24
150.0000 mg | ORAL_TABLET | Freq: Two times a day (BID) | ORAL | Status: DC
  Administered 2013-09-26 – 2013-09-28 (×5): 150 mg via ORAL
  Filled 2013-09-25 (×8): qty 1

## 2013-09-25 MED ORDER — ALPRAZOLAM 0.5 MG PO TABS
0.5000 mg | ORAL_TABLET | Freq: Two times a day (BID) | ORAL | Status: DC | PRN
  Filled 2013-09-25: qty 1

## 2013-09-25 MED ORDER — ISOSORBIDE MONONITRATE ER 30 MG PO TB24
30.0000 mg | ORAL_TABLET | Freq: Every day | ORAL | Status: DC
  Administered 2013-09-26 – 2013-09-28 (×3): 30 mg via ORAL
  Filled 2013-09-25 (×3): qty 1

## 2013-09-25 MED ORDER — SIMVASTATIN 20 MG PO TABS
20.0000 mg | ORAL_TABLET | Freq: Every day | ORAL | Status: DC
  Administered 2013-09-26 – 2013-09-27 (×2): 20 mg via ORAL
  Filled 2013-09-25 (×3): qty 1

## 2013-09-25 MED ORDER — ASPIRIN EC 81 MG PO TBEC
81.0000 mg | DELAYED_RELEASE_TABLET | Freq: Every day | ORAL | Status: DC
  Administered 2013-09-26 – 2013-09-28 (×3): 81 mg via ORAL
  Filled 2013-09-25 (×3): qty 1

## 2013-09-25 NOTE — Telephone Encounter (Signed)
Agree with recommendation to call EMS and go to the ER

## 2013-09-25 NOTE — Telephone Encounter (Signed)
Contacted pts Network engineer and she states pt has been very fatigued today, and she took his BP several times in 10 min increments, and noted it in low 80s.  Last bp reading was 80/48. HR- 112.  Secretary states pt has some sob, and noted peripheral edema.  Pt also feeling some heaviness in chest.  Pt feels very flushed but skin cool and clammy to touch.  Secretary states pt is alert and responsive, but very tired.  Pt has hx of chronic CHF, coronary disease, and many more cardiac issues.  Transport planner given the pts hx, she needs to call 911 now and have pt transported to [facility] ED for further eval.  Informed secretary that we will notify card master Trish at [facility] to further update her on pt being transported to [facility] ED for cardiac work-up.  Secretary verbalized understanding of instructions given and agrees with this plan.  Will forward this note to Truitt Merle NP for her review.

## 2013-09-25 NOTE — Progress Notes (Addendum)
CSW was requested to speak with the patient and family about assistant living facilities as an option.  CSW provided the family with a listing of [facility] and explained the process of payment.  CSW encouraged the patient and family to research the cost of chosen facilities and call department of social services about medicaid benefits.  Patient reports some potential veteran benefits that he will call and confirm.  No further CSW assistance is needed at this time.      Chesley Noon, MSW, [place], 09/25/2013 Evening Clinical Social Worker [phone]

## 2013-09-25 NOTE — ED Notes (Signed)
Attempted calling report to [facility]. Will attempt to call back.

## 2013-09-25 NOTE — ED Notes (Addendum)
Per patient-"has been under a lot of stress lately." Wife suffers from dementia and was taken unexpectedly by her daughter (NP) and son-in-law to [facility]. Pt hasn't been able to talk to her and says his family wants him to move to [facility]. 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 Feels depressed living by himself since wife left. Has thought about "hurting himself" but no SI right now. Pt also c/o fatigue recently. Suffers from sleep apnea and isn't able to sleep as well recently. Has a lady to help him but doesn't have mode of transportation. No other complaints at this time.

## 2013-09-25 NOTE — ED Notes (Signed)
Attempted calling report to [facility]. RN will return call shortly.

## 2013-09-25 NOTE — ED Notes (Signed)
Bed: RX45 Expected date:  Expected time:  Means of arrival:  Comments: EMS-tired

## 2013-09-25 NOTE — Telephone Encounter (Signed)
Pt's secretary called concerned because the patients BP was extremely low 80/48. He is burning up hot, but cold to the touch. Advised Izora Gala that Karlene Einstein would be calling her back.

## 2013-09-25 NOTE — ED Provider Notes (Signed)
CSN: [id number]     Arrival date & time 09/25/13  1608 History   First MD Initiated Contact with Patient 09/25/13 1610     Chief Complaint  Patient presents with  . Weakness     (Consider location/radiation/quality/duration/timing/severity/associated sxs/prior Treatment) HPI Comments: Hypotensive today - multiple rechecks at home with BPs in the 70s/80s.  With EMS - BPs stable, but bradycardic. Denies extra meds, CP, SOB, dizziness, N/V/D.  Patient is a 80 y.o. male presenting with weakness. The history is provided by the patient.  Weakness This is a new problem. The current episode started 3 to 5 hours ago. The problem occurs constantly. The problem has not changed since onset.Pertinent negatives include no chest pain, no abdominal pain and no shortness of breath. Nothing aggravates the symptoms. Nothing relieves the symptoms.    Past Medical History  Diagnosis Date  . CAD (coronary artery disease)     no known MImultiple PCIsall vein grafts occluded, LIMA patent by cath 08/2008  . FUO (fever of unknown origin) 1994    resolved---unclear etiology  . Gout   . Macular degeneration     followed at [facility]  . PVD (peripheral vascular disease)   . OSA (obstructive sleep apnea)     using CPAP  . Diastolic heart failure     ef 55% echo 08/2008  . Hypertension   . Hyperlipidemia   . Renal insufficiency   . Depression     symptoms  . CKD (chronic kidney disease), stage II     with a glomerular fitration rate of 37 at discharge   . Renal adenoma     hx  . Nephrolithiasis     hx   . Hx of skin cancer, basal cell   . History of echocardiogram 5/10    EF 55%  . AAA (abdominal aortic aneurysm) 2002    3.2 cm, with repair  . History of tobacco use     Remote (same for alcohol use)   Past Surgical History  Procedure Laterality Date  . Abdominal aortic aneurysm repair    . Transurethral resection of prostate    . Coronary artery bypass graft  1990    s/p multiple PCIs with  occlusion of al SVGs and only patnet LIMA at cath 5/10   . Cholecystectomy    . Lumbar disc surgery      x5  . Coronary stent placement      multiple; 12/02  . Back surgery    . Prostate surgery     Family History  Problem Relation Age of Onset  . Emphysema Father   . Heart disease Paternal Uncle     x2   History  Substance Use Topics  . Smoking status: Former Smoker -- 2.00 packs/day for 52 years    Quit date: 04/30/1988  . Smokeless tobacco: Not on file     Comment: smoked x52 years; up to 2 ppd   . Alcohol Use: No    Review of Systems  Constitutional: Positive for fatigue. Negative for fever and chills.  Respiratory: Negative for cough and shortness of breath.   Cardiovascular: Negative for chest pain and leg swelling.  Gastrointestinal: Negative for vomiting and abdominal pain.  Neurological: Positive for weakness.  All other systems reviewed and are negative.     Allergies  Review of patient's allergies indicates no known allergies.  Home Medications   Prior to Admission medications   Medication Sig Start Date End Date Taking? Authorizing Provider  ALPRAZolam (XANAX) 0.5 MG tablet Take 1 tablet (0.5 mg total) by mouth 2 (two) times daily as needed. 10/13/12   Lisabeth Pick, MD  fluticasone (FLONASE) 50 MCG/ACT nasal spray Place 2 sprays into both nostrils daily. 07/13/13   Burtis Junes, NP  nitroGLYCERIN (NITROSTAT) 0.4 MG SL tablet Place 0.4 mg under the tongue every 5 (five) minutes as needed.      Historical Provider, MD   BP 128/57  Pulse 45  Temp(Src) 98 F (36.7 C) (Oral)  Resp 18  SpO2 100% Physical Exam  Nursing note and vitals reviewed. Constitutional: He is oriented to person, place, and time. He appears well-developed and well-nourished. No distress.  HENT:  Head: Normocephalic and atraumatic.  Mouth/Throat: No oropharyngeal exudate.  Eyes: EOM are normal. Pupils are equal, round, and reactive to light.  Neck: Normal range of motion. Neck  supple.  Cardiovascular: Regular rhythm.  Bradycardia present.  Exam reveals no friction rub.   No murmur heard. Pulmonary/Chest: Effort normal and breath sounds normal. No respiratory distress. He has no wheezes. He has no rales.  Abdominal: He exhibits no distension. There is no tenderness. There is no rebound.  Musculoskeletal: Normal range of motion. He exhibits no edema.  Neurological: He is alert and oriented to person, place, and time.  Skin: He is not diaphoretic.    ED Course  Procedures (including critical care time) Labs Review Labs Reviewed  [place], ED    Imaging Review Dg Chest Port 1 View  09/25/2013   CLINICAL DATA:  Bradycardia. Current history of hypertension. Former smoker. Prior CABG.  EXAM: PORTABLE CHEST - 1 VIEW  COMPARISON:  Two-view chest x-ray 10/03/2010. One view chest x-ray 03/13/2010.  FINDINGS: Cardiac silhouette moderately enlarged but stable. Prior sternotomy for CABG. Thoracic aorta tortuous and mildly atherosclerotic, unchanged. Hilar and mediastinal contours otherwise unremarkable. Lungs clear. Bronchovascular markings normal. Pulmonary vascularity normal. No visible pleural effusions. No pneumothorax.  IMPRESSION: Stable cardiomegaly.  No acute cardiopulmonary disease.   Electronically Signed   By: Evangeline Dakin M.D.   On: 09/25/2013 16:43     EKG Interpretation   Date/Time:  Friday Sep 25 2013 16:19:04 EDT Ventricular Rate:  45 PR Interval:  225 QRS Duration: 103 QT Interval:  501 QTC Calculation: 433 R Axis:   76 Text Interpretation:  Sinus bradycardia Borderline prolonged PR interval  Borderline repolarization abnormality Bradycardia similar to prior  Confirmed by Mingo Amber  MD, [place] ([id number]) on 09/25/2013 4:25:33 PM      MDM   Final diagnoses:  Congestive heart failure    55M with hx of CAD, CABG, HTN, HLD presents with weakness. Patient was tired today - homehealth  nurse took his BP several times - noted to be low - 66Y/40H systolic. Patient's HR was noted to be elevated also - in the 110s. With EMS, BP ok, 474Q systolic, with bradycardia - HRs in the 40s. Patient denies dizziness, CP, SOB, N/V/D, recent illness, just complaining of fatigue.  EKG here with bradycardia, similar to prior - no evidence of complete heart block. States not taking excess meds, doubt beta blocker overdose. Labs show elevation of BNP. Lasix given. Dr. Fransico Him consulted, admitted patient to [facility] hospital.    Osvaldo Shipper, MD 09/25/13 2352

## 2013-09-25 NOTE — H&P (Addendum)
Admit date: 09/25/2013 Referring Physician Dr. Mingo Amber Primary Cardiologist:  Dr. Aundra Dubin Chief complaint/reason for admission: CHF  HPI: This is an 80 yo male with multiple medical issues which include CAD, s/p CABG in 1998 and significant diastolic heart failure. Last cath was in 2010 showing his grafts to be occluded except for the LIMA to the LAD. Not a candidate for intervention or redo surgery. EF was 55% with moderate LVH per echo in 2013. 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 Other issues include prior AAA repair, CKD and advanced age. There have been issues over the past few months with no one administering his medications.  His wife has dementia and there is a lot of stress over taking care of her.  Apparently [facility] has notified our office in the past noting that when he comes to get his med refilled there are multiple meds in his box.  Today he was noted to have a BP of 80/48 and  fatigued with increased  LE edema.  He denies any chest pain to me but apparently told someone else that He had also been having some chest heaviness.  Apparently his HR was in the 110's but when EMS got there his HR was in the 30-40's.  He denies taking any extra meds.  In ER BNP was elevated at 3044 and he is in worsening renal failure with Creat 2.28.  Chest xray shows no cardiopulmonary disease.Cardiology is now asked to admit.    PMH:    Past Medical History  Diagnosis Date  . CAD (coronary artery disease)     no known MImultiple PCIsall vein grafts occluded, LIMA patent by cath 08/2008  . FUO (fever of unknown origin) 1994    resolved---unclear etiology  . Gout   . Macular degeneration     followed at [facility]  . PVD (peripheral vascular disease)   . OSA (obstructive sleep apnea)     using CPAP  . Diastolic heart failure     ef 55% echo 08/2008  . Hypertension   . Hyperlipidemia   . Renal insufficiency   . Depression     symptoms  . CKD (chronic kidney  disease), stage II     with a glomerular fitration rate of 37 at discharge   . Renal adenoma     hx  . Nephrolithiasis     hx   . Hx of skin cancer, basal cell   . History of echocardiogram 5/10    EF 55%  . AAA (abdominal aortic aneurysm) 2002    3.2 cm, with repair  . History of tobacco use     Remote (same for alcohol use)    PSH:    Past Surgical History  Procedure Laterality Date  . Abdominal aortic aneurysm repair    . Transurethral resection of prostate    . Coronary artery bypass graft  1990    s/p multiple PCIs with occlusion of al SVGs and only patnet LIMA at cath 5/10   . Cholecystectomy    . Lumbar disc surgery      x5  . Coronary stent placement      multiple; 12/02  . Back surgery    . Prostate surgery      ALLERGIES:   Review of patient's allergies indicates no known allergies.  Prior to Admit Meds:   (Not in a hospital admission) Family HX:    Family History  Problem Relation Age of Onset  .  Emphysema Father   . Heart disease Paternal Uncle     x2   Social HX:    History   Social History  . Marital Status: Married    Spouse Name: N/A    Number of Children: N/A  . Years of Education: N/A   Occupational History  . Owner of used car lot     Retired   Social History Main Topics  . Smoking status: Former Smoker -- 2.00 packs/day for 52 years    Quit date: 04/30/1988  . Smokeless tobacco: Not on file     Comment: smoked x52 years; up to 2 ppd   . Alcohol Use: No  . Drug Use: Not on file  . Sexual Activity: Not Currently   Other Topics Concern  . 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   Normal cephalic and atramatic  Lungs:   Clear bilaterally to auscultation and percussion. Heart:   HRRR S1 S2 Pulses are 2+ & equal.            No carotid bruit. No JVD.  No abdominal bruits. No femoral bruits. Abdomen: Bowel sounds are positive, abdomen soft and non-tender without masses Extremities:  2+ pitting edema bilaterally Neuro: Alert and oriented X 3. Psych:  Good affect, responds appropriately   Labs:   Lab Results  Component Value Date   WBC 8.2 09/25/2013   HGB 12.3* 09/25/2013   HCT 37.3* 09/25/2013   MCV 86.1 09/25/2013   PLT 195 09/25/2013    Recent Labs Lab 09/25/13 1618  NA 139  K 4.4  CL 99  CO2 26  BUN 35*  CREATININE 2.28*  CALCIUM 9.3  GLUCOSE 91   Lab Results  Component Value Date   CKTOTAL 97 09/05/2008   CKMB 2.9 09/05/2008   TROPONINI  Value: 0.05        NO INDICATION OF MYOCARDIAL INJURY. 09/05/2008   No results found for this basename: PTT   Lab Results  Component Value Date   INR 1.3 09/06/2008   INR 1.0 RATIO 05/09/2006     Lab Results  Component Value Date   CHOL 124 07/13/2013   CHOL 159 06/24/2012   CHOL 130 04/02/2011   Lab Results  Component Value Date   HDL 36.90* 07/13/2013   HDL 36.90* 06/24/2012   HDL 32.50* 04/02/2011   Lab Results  Component Value Date   LDLCALC 67 07/13/2013   LDLCALC 87 06/24/2012   LDLCALC 66 04/02/2011   Lab Results  Component Value Date   TRIG 99.0 07/13/2013   TRIG 174.0* 06/24/2012   TRIG 156.0* 04/02/2011   Lab Results  Component Value Date   CHOLHDL 3 07/13/2013   CHOLHDL 4 06/24/2012   CHOLHDL 4 04/02/2011   Lab Results  Component Value Date   LDLDIRECT 99.1 10/03/2007   LDLDIRECT 85.5 06/05/2007   LDLDIRECT 108.6 04/03/2007      Radiology:  No results found.  EKG:  Sinus bradycardia with nonspecific  ST abnormality  ASSESSMENT:  1.  Acute on chronic diastolic CHF - BNP elevated more than his usual at 200-400.  His chest xray is clear and lungs are  clear.  I think he is mildly volume overloaded and has significant LE edema. 2.  Acute on CKD - this may be due to transient low BP.  He BP is fine now.  He renal function may improve with diuresis. 3.  Hypotension resolved 4.  ? Transient bradycardia - resolved 5.  ASCAD s/p CABG with only LIMA patent 6.  OSA on CPAP 7.  HTN stable  PLAN:   1.  Admit to tele bed 2.  Cycle cardiac enzymes 3.  Will hold Carvedilol/amlodipine and Losartan tonight and restart in am if HR and BP remain stable overnight. 4.  He was given Lasix 40mg  IV in ER - will reassess in am to determine if more IV diuretics are needed 5.  Continue nitrates/statin 6.  ASA daily- it is not on his home med list 7. Strict I&O's and daily weights 8.  Check TSH 9.  Would get Case Manager to evaluate home situation. He lives alone and there have been issues with compliance with meds.  He has no idea what his meds are and says the pharmacist at [facility] fills his pill boxes. 10.  2D echo in am to reassess LVF - no echo done since 2011  Sueanne Margarita, MD  09/25/2013  7:01 PM

## 2013-09-25 NOTE — Progress Notes (Signed)
  CARE MANAGEMENT ED NOTE 09/25/2013  Patient:  Harold [name], Harold [name]   Account Number:  [id number]  Date Initiated:  09/25/2013  Documentation initiated by:  Livia Snellen  Subjective/Objective Assessment:   Patient presents to Ed with hypotension, increased fatigue and increased lower extrmity edema.     Subjective/Objective Assessment Detail:     Action/Plan:   Action/Plan Detail:   Anticipated DC Date:       Status Recommendation to Physician:   Result of Recommendation:    Other ED Services  Consult Working Plan   In-house referral  Clinical Social Worker   DC Forensic scientist  Other    Choice offered to / List presented to:            Status of service:  Completed, signed off  ED Comments:   ED Comments Detail:  EDCM spoke to patient and his son at bedside. Patient reports he lives at home alone.  Patient's wife has dementia and is now living with her daughter in [place] as per patient.  The patient's wife left for [place] seventeen days ago.  Patient reports he is a patient with the [facility] and a nurse is coming to see him next Thursday at 2pm to speak with him regarding his medications.  Patient reports, "I'm supposed to take nine pills in the morning."  Patient reports he used to take his meications to [facility] to get his "trays filled"  but is now going to the [facility] to get his prescriptions filled.  Patient reports he is able to perform his ADL's without difficulty.  Patient has a cane and a walker at home.  Patient reports he cannot cook but, "I'll fix some grits for myself in the morning and maybe a sandwich for lunch.  I can't cook so I usually send out for my dinner," Patient reports he cannot see "straight out of his left eye but I have  good peripheral vision in my left eye. I have about 95% vision in my right eye. I can't see good enough to drive, I can't read my mail, and I can't read my checks but I can sign them."  Patient reports his  cardiologist is NP Gierhardt at [facility].  Patient confirms his pcp is Dr. Phoebe Sharps.  Patient reports he does not know the name of his medications or what they are for.  Patient reports he is very angry about the situation with his wife and would like to speak to someone regarding anger management. Patient also has questions regarding Assisted Living Facilities.  EDCM placed consult to EDSW.  [facility] provided patient with phone number to DSS to possibly apply for Medicaid.  EDCM provided patient with printed list of private duty  nursing agencies, list of home health agencies in [facility], printed information regarding [facility], and information on the [facility] [place].  EDCM explained with home health, the patient may receive a visiting RN, PT, OT aide and social worker if needed.  Also explained to patient that private duty nursing services may be an out ofpocket expense for patient.  Patient and patient's son thankful for resources and information.  No further EDCM needs at this time.

## 2013-09-26 DIAGNOSIS — I059 Rheumatic mitral valve disease, unspecified: Secondary | ICD-10-CM

## 2013-09-26 LAB — CBC WITH DIFFERENTIAL/PLATELET
Basophils Absolute: 0 10*3/uL (ref 0.0–0.1)
Basophils Relative: 0 % (ref 0–1)
EOS ABS: 0.2 10*3/uL (ref 0.0–0.7)
EOS PCT: 2 % (ref 0–5)
HCT: 35 % — ABNORMAL LOW (ref 39.0–52.0)
Hemoglobin: 11.9 g/dL — ABNORMAL LOW (ref 13.0–17.0)
LYMPHS ABS: 1.3 10*3/uL (ref 0.7–4.0)
Lymphocytes Relative: 18 % (ref 12–46)
MCH: 29.3 pg (ref 26.0–34.0)
MCHC: 34 g/dL (ref 30.0–36.0)
MCV: 86.2 fL (ref 78.0–100.0)
MONO ABS: 1 10*3/uL (ref 0.1–1.0)
Monocytes Relative: 14 % — ABNORMAL HIGH (ref 3–12)
Neutro Abs: 4.6 10*3/uL (ref 1.7–7.7)
Neutrophils Relative %: 66 % (ref 43–77)
Platelets: 184 10*3/uL (ref 150–400)
RBC: 4.06 MIL/uL — AB (ref 4.22–5.81)
RDW: 16.2 % — ABNORMAL HIGH (ref 11.5–15.5)
WBC: 7 10*3/uL (ref 4.0–10.5)

## 2013-09-26 LAB — BASIC METABOLIC PANEL
BUN: 41 mg/dL — ABNORMAL HIGH (ref 6–23)
CO2: 25 meq/L (ref 19–32)
CREATININE: 2.39 mg/dL — AB (ref 0.50–1.35)
Calcium: 9 mg/dL (ref 8.4–10.5)
Chloride: 102 mEq/L (ref 96–112)
GFR calc Af Amer: 27 mL/min — ABNORMAL LOW (ref >90)
GFR calc non Af Amer: 24 mL/min — ABNORMAL LOW (ref >90)
GLUCOSE: 181 mg/dL — AB (ref 70–99)
Potassium: 3.4 mEq/L — ABNORMAL LOW (ref 3.7–5.3)
SODIUM: 140 meq/L (ref 137–147)

## 2013-09-26 LAB — COMPREHENSIVE METABOLIC PANEL
ALT: 5 U/L (ref 0–53)
AST: 11 U/L (ref 0–37)
Albumin: 3.4 g/dL — ABNORMAL LOW (ref 3.5–5.2)
Alkaline Phosphatase: 63 U/L (ref 39–117)
BUN: 40 mg/dL — AB (ref 6–23)
CALCIUM: 9 mg/dL (ref 8.4–10.5)
CO2: 25 mEq/L (ref 19–32)
CREATININE: 2.4 mg/dL — AB (ref 0.50–1.35)
Chloride: 102 mEq/L (ref 96–112)
GFR calc non Af Amer: 24 mL/min — ABNORMAL LOW (ref >90)
GFR, EST AFRICAN AMERICAN: 27 mL/min — AB (ref >90)
GLUCOSE: 102 mg/dL — AB (ref 70–99)
Potassium: 3.6 mEq/L — ABNORMAL LOW (ref 3.7–5.3)
SODIUM: 140 meq/L (ref 137–147)
Total Bilirubin: 0.6 mg/dL (ref 0.3–1.2)
Total Protein: 6.4 g/dL (ref 6.0–8.3)

## 2013-09-26 LAB — MAGNESIUM: MAGNESIUM: 2.2 mg/dL (ref 1.5–2.5)

## 2013-09-26 LAB — TSH: TSH: 2.77 u[IU]/mL (ref 0.350–4.500)

## 2013-09-26 LAB — TROPONIN I
Troponin I: <0.3 ng/mL (ref <0.30)
Troponin I: <0.3 ng/mL (ref <0.30)

## 2013-09-26 MED ORDER — FUROSEMIDE 40 MG PO TABS
40.0000 mg | ORAL_TABLET | Freq: Two times a day (BID) | ORAL | Status: DC
  Administered 2013-09-26 – 2013-09-28 (×4): 40 mg via ORAL
  Filled 2013-09-26 (×6): qty 1

## 2013-09-26 MED ORDER — BOOST PLUS PO LIQD
237.0000 mL | Freq: Two times a day (BID) | ORAL | Status: DC
  Administered 2013-09-26 – 2013-09-28 (×3): 237 mL via ORAL
  Filled 2013-09-26 (×8): qty 237

## 2013-09-26 MED ORDER — CARVEDILOL 12.5 MG PO TABS
12.5000 mg | ORAL_TABLET | Freq: Two times a day (BID) | ORAL | Status: DC
  Administered 2013-09-26 – 2013-09-28 (×4): 12.5 mg via ORAL
  Filled 2013-09-26 (×6): qty 1

## 2013-09-26 NOTE — Progress Notes (Signed)
INITIAL NUTRITION ASSESSMENT  Pt meets criteria for severe MALNUTRITION in the context of social/environmental circumstances as evidenced by <50% estimated energy intake with 10% weight loss in the pat 2 months per pt report.  DOCUMENTATION CODES Per approved criteria  -Severe  malnutrition in the context of social or environmental circumstances   INTERVENTION: - Boost Plus BID - Encouraged continued excellent meal intake - RD to continue to monitor   NUTRITION DIAGNOSIS: Unintended weight loss related to stress, eating less as evidenced by pt report.   Goal: Pt to consume >90% of meals/supplements  Monitor:  Weights, labs, intake  Reason for Assessment: Malnutrition screening tool   80 y.o. male  Admitting Dx: CHF  ASSESSMENT: Pt with multiple medical issues which include CAD, s/p CABG in 1998 and significant diastolic heart failure. 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 Other issues include prior AAA repair, CKD and advanced age. There have been issues over the past few months with no one administering his medications. His wife has dementia and there is a lot of stress over taking care of her, now she is out of state in a facility in [place].   -Pt reports 20 pound unintended weight loss in the past 3 months from stress -Usually eats grits/strudel for breakfast, a small lunch, but a good dinner -Drinks Boost PRN -Eating well during admission, agreeable to getting Boost Plus in the hospital   Potassium low, no replacement ordered  BUN/Cr elevated with low GFR   Height: Ht Readings from Last 1 Encounters:  07/13/13 5' 10.5" (1.791 m)    Weight: Wt Readings from Last 1 Encounters:  09/26/13 179 lb 14.4 oz (81.602 kg)    Ideal Body Weight: 172 lbs  % Ideal Body Weight: 104%  Wt Readings from Last 10 Encounters:  09/26/13 179 lb 14.4 oz (81.602 kg)  07/13/13 193 lb 6.4 oz (87.726 kg)  01/13/13 194 lb (87.998 kg)   09/26/12 187 lb 1.9 oz (84.877 kg)  06/24/12 191 lb (86.637 kg)  06/03/12 192 lb (87.091 kg)  02/28/12 199 lb 12.8 oz (90.629 kg)  02/12/12 202 lb (91.627 kg)  12/24/11 200 lb 12.8 oz (91.082 kg)  08/13/11 205 lb 1.9 oz (93.042 kg)    Usual Body Weight: 199 lbs  % Usual Body Weight: 90%  BMI:  Body mass index is 25.44 kg/(m^2).  Estimated Nutritional Needs: Kcal: 2050-2250 Protein: 100-115g Fluid: 2-2.2L/day  Skin: Intact  Diet Order: Sodium Restricted  EDUCATION NEEDS: -No education needs identified at this time   Intake/Output Summary (Last 24 hours) at 09/26/13 1527 Last data filed at 09/26/13 1009  Gross per 24 hour  Intake      3 ml  Output      0 ml  Net      3 ml    Last BM: PTA  Labs:   Recent Labs Lab 09/25/13 1618 09/26/13 0020 09/26/13 0447  NA 139 140 140  K 4.4 3.6* 3.4*  CL 99 102 102  CO2 26 25 25   BUN 35* 40* 41*  CREATININE 2.28* 2.40* 2.39*  CALCIUM 9.3 9.0 9.0  MG  --  2.2  --   GLUCOSE 91 102* 181*    CBG (last 3)  No results found for this basename: GLUCAP,  in the last 72 hours  Scheduled Meds: . aspirin EC  81 mg Oral Daily  . buPROPion  150 mg Oral BID  . carvedilol  12.5  mg Oral BID WC  . fluticasone  2 spray Each Nare Daily  . furosemide  40 mg Oral BID  . heparin  5,000 Units Subcutaneous 3 times per day  . isosorbide mononitrate  30 mg Oral Daily  . simvastatin  20 mg Oral q1800  . sodium chloride  3 mL Intravenous Q12H    Continuous Infusions:   Past Medical History  Diagnosis Date  . CAD (coronary artery disease)     no known MImultiple PCIsall vein grafts occluded, LIMA patent by cath 08/2008  . FUO (fever of unknown origin) 1994    resolved---unclear etiology  . Gout   . Macular degeneration     followed at [facility]  . PVD (peripheral vascular disease)   . OSA (obstructive sleep apnea)     using CPAP  . Diastolic heart failure     ef 55% echo 08/2008  . Hypertension   . Hyperlipidemia   . Renal  insufficiency   . Depression     symptoms  . CKD (chronic kidney disease), stage II     with a glomerular fitration rate of 37 at discharge   . Renal adenoma     hx  . Nephrolithiasis     hx   . Hx of skin cancer, basal cell   . History of echocardiogram 5/10    EF 55%  . AAA (abdominal aortic aneurysm) 2002    3.2 cm, with repair  . History of tobacco use     Remote (same for alcohol use)    Past Surgical History  Procedure Laterality Date  . Abdominal aortic aneurysm repair    . Transurethral resection of prostate    . Coronary artery bypass graft  1990    s/p multiple PCIs with occlusion of al SVGs and only patnet LIMA at cath 5/10   . Cholecystectomy    . Lumbar disc surgery      x5  . Coronary stent placement      multiple; 12/02  . Back surgery    . Prostate surgery      Mikey College MS, RD, LDN [phone] Weekend/After Hours Pager

## 2013-09-26 NOTE — Progress Notes (Signed)
Echocardiogram 2D Echocardiogram has been performed.  Harold [name] Harold [name] 09/26/2013, 9:43 AM

## 2013-09-26 NOTE — Progress Notes (Addendum)
Patient Name: Harold [name] Date of Encounter: 09/26/2013     Active Problems:   Acute on chronic diastolic CHF (congestive heart failure)   Acute on chronic kidney disease, stage 3    SUBJECTIVE  The patient was admitted with increased peripheral edema.  He has a known LVH with diastolic heart failure.  He is status post CABG in 1998.  Last catheter in 2010 showed his grafts to be occluded except for the LIMA to the LAD.  He is not a candidate for intervention or redo surgery.  He has a history of chronic kidney disease.  Apparently he has had some confusion in the past over his medications.  He states that he takes 9 pills twice a day.  He is concerned about an ulcer on his left lower leg.  No history of trauma and he is not sure how long the ulcer has been present.  No intake or output data available but the patient has a full urinal at bedside this a.m.  CURRENT MEDS . aspirin EC  81 mg Oral Daily  . buPROPion  150 mg Oral BID  . fluticasone  2 spray Each Nare Daily  . heparin  5,000 Units Subcutaneous 3 times per day  . isosorbide mononitrate  30 mg Oral Daily  . simvastatin  20 mg Oral q1800  . sodium chloride  3 mL Intravenous Q12H    OBJECTIVE  Filed Vitals:   09/25/13 2143 09/25/13 2146 09/26/13 0500 09/26/13 0537  BP: 118/62   108/36  Pulse: 42   49  Temp:    98.1 F (36.7 C)  TempSrc:    Oral  Resp: 13   18  Weight:  179 lb 14.4 oz (81.602 kg) 179 lb 14.4 oz (81.602 kg)   SpO2: 92%   98%   No intake or output data in the 24 hours ending 09/26/13 0946 Filed Weights   09/25/13 2146 09/26/13 0500  Weight: 179 lb 14.4 oz (81.602 kg) 179 lb 14.4 oz (81.602 kg)    PHYSICAL EXAM  General: Pleasant, NAD. Neuro: Alert and oriented X 3. Moves all extremities spontaneously. Psych: Normal affect. HEENT:  Normal  Neck: Supple without bruits or JVD. Lungs:  Resp regular and unlabored, CTA. Heart: RRR no s3, s4, or murmurs. Abdomen: Soft, non-tender,  non-distended, BS + x 4.  Extremities: Mild pretibial edema. There is a superficial left lower leg ulcer overlying the previous saphenous vein harvesting site.  He has a weak dorsalis pedis pulse  Accessory Clinical Findings  CBC  Recent Labs  09/25/13 1618 09/26/13 0020  WBC 8.2 7.0  NEUTROABS  --  4.6  HGB 12.3* 11.9*  HCT 37.3* 35.0*  MCV 86.1 86.2  PLT 195 381   Basic Metabolic Panel  Recent Labs  09/25/13 1618 09/26/13 0020 09/26/13 0447  NA 139 140 140  K 4.4 3.6* 3.4*  CL 99 102 102  CO2 26 25 25   GLUCOSE 91 102* 181*  BUN 35* 40* 41*  CREATININE 2.28* 2.40* 2.39*  CALCIUM 9.3 9.0 9.0  MG  --  2.2  --    Liver Function Tests  Recent Labs  09/26/13 0020  AST 11  ALT 5  ALKPHOS 63  BILITOT 0.6  PROT 6.4  ALBUMIN 3.4*   No results found for this basename: LIPASE, AMYLASE,  in the last 72 hours Cardiac Enzymes  Recent Labs  09/26/13 0020 09/26/13 0448  TROPONINI <0.30 <0.30   BNP No components found  with this basename: POCBNP,  D-Dimer No results found for this basename: DDIMER,  in the last 72 hours Hemoglobin A1C No results found for this basename: HGBA1C,  in the last 72 hours Fasting Lipid Panel No results found for this basename: CHOL, HDL, LDLCALC, TRIG, CHOLHDL, LDLDIRECT,  in the last 72 hours Thyroid Function Tests  Recent Labs  09/26/13 0020  TSH 2.770    TELE  Normal sinus rhythm  ECG    Radiology/Studies  Dg Chest Port 1 View  09/25/2013   CLINICAL DATA:  Bradycardia. Current history of hypertension. Former smoker. Prior CABG.  EXAM: PORTABLE CHEST - 1 VIEW  COMPARISON:  Two-view chest x-ray 10/03/2010. One view chest x-ray 03/13/2010.  FINDINGS: Cardiac silhouette moderately enlarged but stable. Prior sternotomy for CABG. Thoracic aorta tortuous and mildly atherosclerotic, unchanged. Hilar and mediastinal contours otherwise unremarkable. Lungs clear. Bronchovascular markings normal. Pulmonary vascularity normal. No  visible pleural effusions. No pneumothorax.  IMPRESSION: Stable cardiomegaly.  No acute cardiopulmonary disease.   Electronically Signed   By: Evangeline Dakin M.D.   On: 09/25/2013 16:43    ASSESSMENT AND PLAN  1. Acute on chronic diastolic CHF - BNP elevated more than usual at 200-400. His chest xray is clear and lungs are clear. I think he is mildly volume overloaded and has significant LE edema.  2. Acute on CKD - this may be due to transient low BP. He BP is fine now. He renal function may improve with diuresis.  3. Hypotension resolved  4. ? Transient bradycardia - resolved  5. 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 OSA on CPAP  7. HTN stable 8. superficial ulcer left lower leg  Plan:  1. Admit to tele bed  2. Cycle cardiac enzymes  3. Will resume carvedilol at a lower dose.  We will resume losartan.  DC amlodipine. 4. He was given Lasix 40mg  IV in ER - will reassess in am to determine if more IV diuretics are needed  5. Continue nitrates/statin  6. ASA daily- it is not on his home med list  7. Strict I&O's and daily weights  8. Check TSH  9. Would get Case Manager to evaluate home situation. He lives alone and there have been issues with compliance with meds. He has no idea what his meds are and says the pharmacist at [facility] fills his pill boxes.  10. 2D echo in am to reassess LVF - no echo done since 2011 11.  Will ask for a wound care consult regarding his left leg ulcer.  Will request lower leg Dopplers  Signed, Darlin Coco MD

## 2013-09-27 DIAGNOSIS — L97909 Non-pressure chronic ulcer of unspecified part of unspecified lower leg with unspecified severity: Secondary | ICD-10-CM

## 2013-09-27 LAB — BASIC METABOLIC PANEL
BUN: 42 mg/dL — ABNORMAL HIGH (ref 6–23)
CHLORIDE: 102 meq/L (ref 96–112)
CO2: 26 mEq/L (ref 19–32)
CREATININE: 2.22 mg/dL — AB (ref 0.50–1.35)
Calcium: 8.8 mg/dL (ref 8.4–10.5)
GFR, EST AFRICAN AMERICAN: 30 mL/min — AB (ref >90)
GFR, EST NON AFRICAN AMERICAN: 26 mL/min — AB (ref >90)
Glucose, Bld: 85 mg/dL (ref 70–99)
Potassium: 3.7 mEq/L (ref 3.7–5.3)
Sodium: 141 mEq/L (ref 137–147)

## 2013-09-27 NOTE — Progress Notes (Addendum)
Patient Name: Harold [name] Date of Encounter: 09/27/2013     Active Problems:   Acute on chronic diastolic CHF (congestive heart failure)   Acute on chronic kidney disease, stage 3    SUBJECTIVE  The patient did not have any acute dyspnea or chest pain overnight.  He wakes up tired in the morning and he thinks it is from his obstructive sleep apnea.  He has not been in the habit of using his CPAP machine and he thinks he may be using it incorrectly.he will have someone bring his machine in so that respiratory therapy can go over the instructions with him . His ulcerated area on the left lower leg is unchanged.  Wound care consult is pending . Arterial Dopplers of his lower extremities are pending .  CURRENT MEDS . aspirin EC  81 mg Oral Daily  . buPROPion  150 mg Oral BID  . carvedilol  12.5 mg Oral BID WC  . fluticasone  2 spray Each Nare Daily  . furosemide  40 mg Oral BID  . heparin  5,000 Units Subcutaneous 3 times per day  . isosorbide mononitrate  30 mg Oral Daily  . lactose free nutrition  237 mL Oral BID BM  . simvastatin  20 mg Oral q1800  . sodium chloride  3 mL Intravenous Q12H    OBJECTIVE  Filed Vitals:   09/26/13 1814 09/26/13 2100 09/27/13 0634 09/27/13 0904  BP: 147/72 119/50 136/49 146/48  Pulse: 61 58 49 58  Temp:  98.6 F (37 C) 98.8 F (37.1 C)   TempSrc:  Oral    Resp:  16 16   Weight:      SpO2:  97% 95%     Intake/Output Summary (Last 24 hours) at 09/27/13 1056 Last data filed at 09/26/13 2100  Gross per 24 hour  Intake    240 ml  Output   1150 ml  Net   -910 ml   Filed Weights   09/25/13 2146 09/26/13 0500  Weight: 179 lb 14.4 oz (81.602 kg) 179 lb 14.4 oz (81.602 kg)    PHYSICAL EXAM  General: Pleasant, NAD.  Neuro: Alert and oriented X 3. Moves all extremities spontaneously.  Psych: Normal affect.  HEENT: Normal  Neck: Supple without bruits or JVD.  Lungs: Resp regular and unlabored, CTA.  Heart: RRR no s3, s4, or  murmurs.  Abdomen: Soft, non-tender, non-distended, BS + x 4.  Extremities: Mild pretibial edema. There is a superficial left lower leg ulcer overlying the previous saphenous vein harvesting site. He has a weak dorsalis pedis pulse    Accessory Clinical Findings  CBC  Recent Labs  09/25/13 1618 09/26/13 0020  WBC 8.2 7.0  NEUTROABS  --  4.6  HGB 12.3* 11.9*  HCT 37.3* 35.0*  MCV 86.1 86.2  PLT 195 409   Basic Metabolic Panel  Recent Labs  09/25/13 1618 09/26/13 0020 09/26/13 0447 09/27/13 0600  NA 139 140 140 141  K 4.4 3.6* 3.4* 3.7  CL 99 102 102 102  CO2 26 25 25 26   GLUCOSE 91 102* 181* 85  BUN 35* 40* 41* 42*  CREATININE 2.28* 2.40* 2.39* 2.22*  CALCIUM 9.3 9.0 9.0 8.8  MG  --  2.2  --   --    Liver Function Tests  Recent Labs  09/26/13 0020  AST 11  ALT 5  ALKPHOS 63  BILITOT 0.6  PROT 6.4  ALBUMIN 3.4*   No results found for  this basename: LIPASE, AMYLASE,  in the last 72 hours Cardiac Enzymes  Recent Labs  09/26/13 0020 09/26/13 0448 09/26/13 1100  TROPONINI <0.30 <0.30 <0.30   BNP No components found with this basename: POCBNP,  D-Dimer No results found for this basename: DDIMER,  in the last 72 hours Hemoglobin A1C No results found for this basename: HGBA1C,  in the last 72 hours Fasting Lipid Panel No results found for this basename: CHOL, HDL, LDLCALC, TRIG, CHOLHDL, LDLDIRECT,  in the last 72 hours Thyroid Function Tests  Recent Labs  09/26/13 0020  TSH 2.770    TELE  Normal sinus rhythm   ECG   2-D echocardiogram:  - Left ventricle: The cavity size was normal. Wall thickness was increased in a pattern of mild LVH. Systolic function was normal. The estimated ejection fraction was in the range of 60% to 65%. Features are consistent with a pseudonormal left ventricular filling pattern, with concomitant abnormal relaxation and increased filling pressure (grade 2 diastolic dysfunction). - Mitral valve: There was  moderate regurgitation.    Radiology/Studies  Dg Chest Port 1 View  09/25/2013   CLINICAL DATA:  Bradycardia. Current history of hypertension. Former smoker. Prior CABG.  EXAM: PORTABLE CHEST - 1 VIEW  COMPARISON:  Two-view chest x-ray 10/03/2010. One view chest x-ray 03/13/2010.  FINDINGS: Cardiac silhouette moderately enlarged but stable. Prior sternotomy for CABG. Thoracic aorta tortuous and mildly atherosclerotic, unchanged. Hilar and mediastinal contours otherwise unremarkable. Lungs clear. Bronchovascular markings normal. Pulmonary vascularity normal. No visible pleural effusions. No pneumothorax.  IMPRESSION: Stable cardiomegaly.  No acute cardiopulmonary disease.   Electronically Signed   By: Evangeline Dakin M.D.   On: 09/25/2013 16:43    ASSESSMENT AND PLAN  1. Acute on chronic diastolic CHF - BNP elevated more than usual at 200-400. His chest xray is clear and lungs are clear. I think he is mildly volume overloaded and has significant LE edema.  2. Acute on CKD - this may be due to transient low BP. He BP is fine now. He renal function may improve with diuresis.  3. Hypotension resolved  4. ? Transient bradycardia - resolved  5. 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 OSA on CPAP  7. HTN stable  8. superficial ulcer left lower leg   Plan: Continue Lasix 40 mg twice a day. His dyspnea is improved although his weight has not decreased.  Renal function stable. Respiratory therapy work with him on his obstructive sleep apnea and CPAP machine. We are awaiting wound care consult and arterial Dopplers regarding his left lower extremity ulcer. Recheck labs in the a.m. possibly home in a.m. if stable  Signed, Darlin Coco MD

## 2013-09-27 NOTE — Progress Notes (Signed)
VASCULAR LAB PRELIMINARY  ARTERIAL  ABI completed: Bilateral moderate arterial insufficiency.     RIGHT    LEFT    PRESSURE WAVEFORM  PRESSURE WAVEFORM  BRACHIAL 119 Tri BRACHIAL 119 Tri  DP   DP    AT 65 Mono  AT 65 Mono   PT 67 Mono  PT 71 Mono   PER   PER    GREAT TOE  NA GREAT TOE  NA    RIGHT LEFT  ABI 0.56 0.6     Landry Mellow, RDMS, RVT  09/27/2013, 11:15 AM

## 2013-09-27 NOTE — Progress Notes (Signed)
RT spoke to patient about wearing CPAP.  Patient stated that he would like to try to not wear tonight as he doesn't wear his CPAP at home currently.  Patient stated that he would get caregiver tomorrow if he isn't discharged to bring home CPAP.  Patient states that he isn't sure if he is working the CPAP at home correctly.  RT advised the patient to contact the homehealth provider and possibly get a chin strap due to the patient stating that he sleeps with his mouth open.  RN was present and aware that patient will call if he changes his mind about wearing CPAP. RT will continue to monitor.

## 2013-09-27 NOTE — Progress Notes (Signed)
Utilization Review Completed.  

## 2013-09-27 NOTE — Consult Note (Signed)
WOC wound consult note Reason for Consult:Left medial LE chronic ulceration, full thickness. Wound is located over the previous saphenous vein harvesting site. Dorsalis pedis pulse is weak, but palpable. Wound type: suspected vascular insufficiency Pressure Ulcer POA: No Measurement: 3cm x 1cm with undetermined depth due to the presence of dried serosanguinous exudate over the wound Wound bed: Obscured by the presence of dried serosanguinous exudate  Drainage (amount, consistency, odor) None at this time.  Edema is subsiding with LE elevation; patient has been in bed today. Periwound:Mild pretibial edema, some evidence of hemosiderin staining, but it is not in a classic pattern. Dressing procedure/placement/frequency: I will initiate a conservative topical plan of care for the ulceration and while I suspect venous insufficiency as the etiology, will suggest an ABI for definitive diagnosis.  If the ABI is equal to or greater than 0.15mmHg, might consider compression therapy by way of an Unna's Boot or other multi-layer compression wrap for the immediate management of this condition and the ulceration-applied either by the outpatient wound center of the patient's choosing or by a [place].  When ulceration has healed, patient could be measured for compression hosiery for long-term management of the condition.  Should the ABI be less than 0.16mmHg, might consider consulting Vascular Surgery (VVS).  If you agree, please order. [facility] nursing team will not follow, but will remain available to this patient, the nursing and medical teams.  Please re-consult if needed. Thanks, Maudie Flakes, MSN, RN, [place], [place], [place] [phone])

## 2013-09-28 ENCOUNTER — Other Ambulatory Visit: Payer: Self-pay | Admitting: Physician Assistant

## 2013-09-28 DIAGNOSIS — I5032 Chronic diastolic (congestive) heart failure: Secondary | ICD-10-CM

## 2013-09-28 DIAGNOSIS — I739 Peripheral vascular disease, unspecified: Secondary | ICD-10-CM

## 2013-09-28 DIAGNOSIS — N189 Chronic kidney disease, unspecified: Secondary | ICD-10-CM

## 2013-09-28 DIAGNOSIS — H3530 Unspecified macular degeneration: Secondary | ICD-10-CM

## 2013-09-28 DIAGNOSIS — E43 Unspecified severe protein-calorie malnutrition: Secondary | ICD-10-CM | POA: Insufficient documentation

## 2013-09-28 LAB — BASIC METABOLIC PANEL
BUN: 38 mg/dL — ABNORMAL HIGH (ref 6–23)
CO2: 28 meq/L (ref 19–32)
Calcium: 8.7 mg/dL (ref 8.4–10.5)
Chloride: 100 mEq/L (ref 96–112)
Creatinine, Ser: 2.04 mg/dL — ABNORMAL HIGH (ref 0.50–1.35)
GFR calc Af Amer: 33 mL/min — ABNORMAL LOW (ref >90)
GFR, EST NON AFRICAN AMERICAN: 29 mL/min — AB (ref >90)
Glucose, Bld: 93 mg/dL (ref 70–99)
Potassium: 3.9 mEq/L (ref 3.7–5.3)
Sodium: 140 mEq/L (ref 137–147)

## 2013-09-28 MED ORDER — FUROSEMIDE 40 MG PO TABS: 40.0000 mg | ORAL_TABLET | Freq: Two times a day (BID) | ORAL | Status: DC

## 2013-09-28 MED ORDER — ASPIRIN 81 MG PO TBEC: 81.0000 mg | DELAYED_RELEASE_TABLET | Freq: Every day | ORAL | Status: DC

## 2013-09-28 MED ORDER — CARVEDILOL 12.5 MG PO TABS: 12.5000 mg | ORAL_TABLET | Freq: Two times a day (BID) | ORAL | Status: DC

## 2013-09-28 NOTE — Progress Notes (Signed)
Reviewed discharge instructions with patient verbally, due to his macular degeneration; he stated he would have his friend/caregiver review instructions at home also.  Patient states his pharmacy helps him fill his pill bottles.  Instructed patient to pick up prescriptions and have pharmacist review discharge medication list to assist with filling pill cups, patient stated his understanding.  Discharged to home via wheelchair with friend.  Sanda Linger

## 2013-09-28 NOTE — Care Management Note (Signed)
    Page 1 of 1   09/28/2013     11:30:28 AM CARE MANAGEMENT NOTE 09/28/2013  Patient:  Harold [name], Harold [name]   Account Number:  [id number]  Date Initiated:  09/28/2013  Documentation initiated by:  GRAVES-BIGELOW,Shontavia Mickel  Subjective/Objective Assessment:   Pt admitted for CHF. Pt has support of son that lives in [place]. Pt states his wife was removed from the house via her daughter to [place]. Pt states he is angry d/t this.     Action/Plan:   CM did make referral for [facility] via [facility]. Pt is agreeable to services. [facility] will f/u with pt once d/c. PT to evaluate.   Anticipated DC Date:  09/28/2013   Anticipated DC Plan:  [place]  CM consult      [facility] Choice  HOME HEALTH   Choice offered to / List presented to:  C-1 Patient        [place] arranged  HH-1 RN  [place]      [place] agency  [place]   Status of service:  Completed, signed off Medicare Important Message given?  NO (If response is "NO", the following Medicare IM given date fields will be blank) Date Medicare IM given:   Date Additional Medicare IM given:    Discharge Disposition:  [place]  Per UR Regulation:  Reviewed for med. necessity/level of care/duration of stay  If discussed at [place] of Stay Meetings, dates discussed:    Comments:

## 2013-09-28 NOTE — Discharge Summary (Addendum)
Physician Discharge Summary     Patient ID: Harold [name] MRN: [id number] DOB/AGE: 08/14/1928 80 y.o.  Admit date: 09/25/2013 Discharge date: 09/28/2013  Admission Diagnoses:   Acute on chronic diastolic CHF (congestive heart failure)  Discharge Diagnoses:  Active Problems: 1. Acute on chronic diastolic CHF  2. Acute on CKD 3. Hypotension resolved  4. Transient bradycardia 5. 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 OSA on CPAP  7. HTN stable  8. Superficial ulcer left lower leg  9. PAD  Discharged Condition: Stable  Hospital Course:   This is an 80 yo male with multiple medical issues which include CAD, s/p CABG in 1998 and significant diastolic heart failure. Last cath was in 2010 showing his grafts to be occluded except for the LIMA to the LAD. Not a candidate for intervention or redo surgery. EF was 55% with moderate LVH per echo in 2013. 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 Other issues include prior AAA repair, CKD and advanced age. There have been issues over the past few months with no one administering his medications. His wife has dementia and there is a lot of stress over taking care of her. Apparently [employer] has notified our office in the past noting that when he comes to get his med refilled there are multiple meds in his box. Today he was noted to have a BP of 80/48 and fatigued with increased LE edema. He denies any chest pain to me but apparently told someone else that He had also been having some chest heaviness. Apparently his HR was in the 110's but when EMS got there his HR was in the 30-40's. He denies taking any extra meds. In ER BNP was elevated at 3044 and he is in worsening renal failure with Creat 2.28. Chest xray shows no cardiopulmonary disease.  The patient was admitted.  He ruled out for MI.    He was given 40mg  of IV lasix in the ER and then changed to 40mg  PO twice daily.  Coreg was decreased to 12.5mg  BID.   CCb was discontinued.  Will hold ARB until follow-up as BP was labile.  SCr improved slightly.  Echo revealed normal EF and grade two diastolic dysfunction.  The patient also has a slow healing ulcer in the medial left LE.  ABIs were completed and the right is 0.56 and left 0.60.  The patient will have a more thorough vascular study in the [facility] clinic and see Dr. Gwenlyn Found for further evaluation on June 11th.  The patient had resolution of the lower extremity edema.  The patient was seen by Dr. Ellyn Hack who felt he was stable for DC home.  Home Health PT and CHF RN follow up is being arranged prior to discharge.   A BMET was ordered for Thursday, June    Out Patient Needs:   BMET-Thursday    Lower Extremity Arterial dopplers    CHF RN/PT  Consults: none  Significant Diagnostic Studies:  Echocardiogram Study Conclusions  - Left ventricle: The cavity size was normal. Wall thickness was increased in a pattern of mild LVH. Systolic function was normal.  The estimated ejection fraction was in the range of 60% to 65%. Features are consistent with a pseudonormal left ventricular filling pattern, with concomitant abnormal relaxation and increased filling pressure (grade 2 diastolic dysfunction). - Mitral valve: There was moderate regurgitation.  Treatments: See above  Discharge Exam: Blood pressure 142/76, pulse 51, temperature  98.7 F (37.1 C), temperature source Oral, resp. rate 16, height 5\' 10"  (1.778 m), weight 181 lb 8 oz (82.328 kg), SpO2 96.00%. General appearance: alert, cooperative and no distress  Lungs: clear to auscultation bilaterally  Heart: regular rate and rhythm and 1/6 sys MM in the RSB  Extremities: No LEE  Pulses: 2+ and symmetric  Skin: Warm and dry. 2-3 Cm dry ulcer in the medial LLE  Neurologic: Grossly normal  Disposition: 01-Home or Self Care  Discharge Instructions   Diet - low sodium heart healthy    Complete by:  As directed      Discharge instructions     Complete by:  As directed   Monitor weight daily.  If you gain 2 pounds in 24 hours, or 5 pounds in a week, you are likely retaining fluid.  Call our office for instructions on taking extra medication to help.  We are also arranging a Home Health nurse to come and help.     Increase activity slowly    Complete by:  As directed             Medication List    STOP taking these medications       amLODipine 10 MG tablet  Commonly known as:  NORVASC     losartan 50 MG tablet  Commonly known as:  COZAAR      TAKE these medications       ALPRAZolam 0.5 MG tablet  Commonly known as:  XANAX  Take 1 tablet (0.5 mg total) by mouth 2 (two) times daily as needed.     aspirin 81 MG EC tablet  Take 1 tablet (81 mg total) by mouth daily.     buPROPion 150 MG 24 hr tablet  Commonly known as:  WELLBUTRIN XL  Take 150 mg by mouth 2 (two) times daily.     carvedilol 12.5 MG tablet  Commonly known as:  COREG  Take 1 tablet (12.5 mg total) by mouth 2 (two) times daily with a meal.     fluticasone 50 MCG/ACT nasal spray  Commonly known as:  FLONASE  Place 2 sprays into both nostrils daily.     furosemide 40 MG tablet  Commonly known as:  LASIX  Take 1 tablet (40 mg total) by mouth 2 (two) times daily.     isosorbide mononitrate 30 MG 24 hr tablet  Commonly known as:  IMDUR  Take 30 mg by mouth daily.     nitroGLYCERIN 0.4 MG SL tablet  Commonly known as:  NITROSTAT  Place 0.4 mg under the tongue every 5 (five) minutes as needed.     potassium chloride 10 MEQ tablet  Commonly known as:  K-DUR  Take 20 mEq by mouth daily.     simvastatin 20 MG tablet  Commonly known as:  ZOCOR  Take 20 mg by mouth daily.           Follow-up Information   Follow up with Richardson Dopp, PA-C On 10/16/2013. (12:10 PM)    Specialty:  Physician Assistant   Contact information:   [address] [place] [address] [phone]       Follow up with Lorretta Harp, MD On  10/07/2013. (10:00AM.  This appointment is for ultrasound of your legs.  you will then see Dr. Gwenlyn Found the following day.)    Specialty:  Cardiology   Contact information:   [address] [place] [address] [phone]  Follow up with Lorretta Harp, MD On 10/08/2013. (1:30 PM)    Specialty:  Cardiology   Contact information:   [address] [place] [address] [phone]       Follow up with Lab On 10/01/2013. (Have blood drawn on Tursday.  You can do this at our lab on [address] or one that is more convienent. )    Contact information:   [address] [place] [address] [phone]     Greater than 30 minutes was spent completing the patient's discharge.   Signed: Tarri Fuller, PA-C 09/28/2013, 10:14 AM  I saw and evaluated the patient on the day of discharge along with Tarri Fuller, PA. I agree summary of the hospital stay.    Labile BP, will not add additional CHF medications for afterload reduction -- in setting of A on CKD, the best option if BP remains elevated would be adding hydralazine to Nitrate vs. Resarting CCB -- ARB & CCB currently being held due to hypotension on initial evaluation.  Close to euvolemic on exam - changed to PO lasix @ BID -- would continue as OP until f/u (or labs determine need to change). Recheck BMP by weeks end.   PAD on Dopplers - read as moderate despite ABIs suggesting severe; Will schedule with Dr. Gwenlyn Found for [facility] Consultation; no compression devices for now to avoid exacerbating wound healing; likely does have baseilne venous insufficiency as well though. Topical Wound Care per [facility].     Unfortunately no close f/u appt available (is pt of Dr. Aundra Dubin - also sees L. Servando Snare) - will be seen on 6/19. Needs Labs this Thurs-Friday to determine duration of increased lasix.     Leonie Man, M.D., M.S. Interventional Cardiologist   Pager # [phone]

## 2013-09-28 NOTE — Discharge Instructions (Signed)
Heart Failure Heart failure means your heart has trouble pumping blood. This makes it hard for your body to work well. Heart failure is usually a long-term (chronic) condition. You must take good care of yourself and follow your doctor's treatment plan. HOME CARE  Take your heart medicine as told by your doctor.  Do not stop taking medicine unless your doctor tells you to.  Do not skip any dose of medicine.  Refill your medicines before they run out.  Take other medicines only as told by your doctor or pharmacist.  Stay active if told by your doctor. The elderly and people with severe heart failure should talk with a doctor about physical activity.  Eat heart healthy foods. Choose foods that are without trans fat and are low in saturated fat, cholesterol, and salt (sodium). This includes fresh or frozen fruits and vegetables, fish, lean meats, fat-free or low-fat dairy foods, whole grains, and high-fiber foods. Lentils and dried peas and beans (legumes) are also good choices.  Limit salt if told by your doctor.  Cook in a healthy way. Roast, grill, broil, bake, poach, steam, or stir-fry foods.  Limit fluids as told by your doctor.  Weigh yourself every morning. Do this after you pee (urinate) and before you eat breakfast. Write down your weight to give to your doctor.  Take your blood pressure and write it down if your doctor tell you to.  Ask your doctor how to check your pulse. Check your pulse as told.  Lose weight if told by your doctor.  Stop smoking or chewing tobacco. Do not use gum or patches that help you quit without your doctor's approval.  Schedule and go to doctor visits as told.  Nonpregnant women should have no more than 1 drink a day. Men should have no more than 2 drinks a day. Talk to your doctor about drinking alcohol.  Stop illegal drug use.  Stay current with shots (immunizations).  Manage your health conditions as told by your doctor.  Learn to manage  your stress.  Rest when you are tired.  If it is really hot outside:  Avoid intense activities.  Use air conditioning or fans, or get in a cooler place.  Avoid caffeine and alcohol.  Wear loose-fitting, lightweight, and light-colored clothing.  If it is really cold outside:  Avoid intense activities.  Layer your clothing.  Wear mittens or gloves, a hat, and a scarf when going outside.  Avoid alcohol.  Learn about heart failure and get support as needed.  Get help to maintain or improve your quality of life and your ability to care for yourself as needed. GET HELP IF:   You gain 03 lb/1.4 kg or more in 1 day or 05 lb/2.3 kg in a week.  You are more short of breath than usual.  You cannot do your normal activities.  You tire easily.  You cough more than normal, especially with activity.  You have any or more puffiness (swelling) in areas such as your hands, feet, ankles, or belly (abdomen).  You cannot sleep because it is hard to breathe.  You feel like your heart is beating fast (palpitations).  You get dizzy or lightheaded when you stand up. GET HELP RIGHT AWAY IF:   You have trouble breathing.  There is a change in mental status, such as becoming less alert or not being able to focus.  You have chest pain or discomfort.  You faint. MAKE SURE YOU:   Understand these  instructions.  Will watch your condition.  Will get help right away if you are not doing well or get worse. Document Released: 01/24/2008 Document Revised: 08/11/2012 Document Reviewed: 11/15/2011 [facility] Patient Information 2014 [place], [place].  Peripheral Vascular Disease Peripheral Vascular Disease (PVD), also called Peripheral Arterial Disease (PAD), is a circulation problem caused by cholesterol (atherosclerotic plaque) deposits in the arteries. PVD commonly occurs in the lower extremities (legs) but it can occur in other areas of the body, such as your arms. The cholesterol buildup  in the arteries reduces blood flow which can cause pain and other serious problems. The presence of PVD can place a person at risk for Coronary Artery Disease (CAD).  CAUSES  Causes of PVD can be many. It is usually associated with more than one risk factor such as:   High Cholesterol.  Smoking.  Diabetes.  Lack of exercise or inactivity.  High blood pressure (hypertension).  Obesity.  Family history. SYMPTOMS   When the lower extremities are affected, patients with PVD may experience:  Leg pain with exertion or physical activity. This is called INTERMITTENT CLAUDICATION. This may present as cramping or numbness with physical activity. The location of the pain is associated with the level of blockage. For example, blockage at the abdominal level (distal abdominal aorta) may result in buttock or hip pain. Lower leg arterial blockage may result in calf pain.  As PVD becomes more severe, pain can develop with less physical activity.  In people with severe PVD, leg pain may occur at rest.  Other PVD signs and symptoms:  Leg numbness or weakness.  Coldness in the affected leg or foot, especially when compared to the other leg.  A change in leg color.  Patients with significant PVD are more prone to ulcers or sores on toes, feet or legs. These may take longer to heal or may reoccur. The ulcers or sores can become infected.  If signs and symptoms of PVD are ignored, gangrene may occur. This can result in the loss of toes or loss of an entire limb.  Not all leg pain is related to PVD. Other medical conditions can cause leg pain such as:  Blood clots (embolism) or Deep Vein Thrombosis.  Inflammation of the blood vessels (vasculitis).  Spinal stenosis. DIAGNOSIS  Diagnosis of PVD can involve several different types of tests. These can include:  Pulse Volume Recording Method (PVR). This test is simple, painless and does not involve the use of X-rays. PVR involves measuring and  comparing the blood pressure in the arms and legs. An ABI (Ankle-Brachial Index) is calculated. The normal ratio of blood pressures is 1. As this number becomes smaller, it indicates more severe disease.  < 0.95  indicates significant narrowing in one or more leg vessels.  <0.8 there will usually be pain in the foot, leg or buttock with exercise.  <0.4 will usually have pain in the legs at rest.  <0.25  usually indicates limb threatening PVD.  Doppler detection of pulses in the legs. This test is painless and checks to see if you have a pulses in your legs/feet.  A dye or contrast material (a substance that highlights the blood vessels so they show up on x-ray) may be given to help your caregiver better see the arteries for the following tests. The dye is eliminated from your body by the kidney's. Your caregiver may order blood work to check your kidney function and other laboratory values before the following tests are performed:  Magnetic  Resonance Angiography (MRA). An MRA is a picture study of the blood vessels and arteries. The MRA machine uses a large magnet to produce images of the blood vessels.  Computed Tomography Angiography (CTA). A CTA is a specialized x-ray that looks at how the blood flows in your blood vessels. An IV may be inserted into your arm so contrast dye can be injected.  Angiogram. Is a procedure that uses x-rays to look at your blood vessels. This procedure is minimally invasive, meaning a small incision (cut) is made in your groin. A small tube (catheter) is then inserted into the artery of your groin. The catheter is guided to the blood vessel or artery your caregiver wants to examine. Contrast dye is injected into the catheter. X-rays are then taken of the blood vessel or artery. After the images are obtained, the catheter is taken out. TREATMENT  Treatment of PVD involves many interventions which may include:  Lifestyle changes:  Quitting  smoking.  Exercise.  Following a low fat, low cholesterol diet.  Control of diabetes.  Foot care is very important to the PVD patient. Good foot care can help prevent infection.  Medication:  Cholesterol-lowering medicine.  Blood pressure medicine.  Anti-platelet drugs.  Certain medicines may reduce symptoms of Intermittent Claudication.  Interventional/Surgical options:  Angioplasty. An Angioplasty is a procedure that inflates a balloon in the blocked artery. This opens the blocked artery to improve blood flow.  Stent Implant. A wire mesh tube (stent) is placed in the artery. The stent expands and stays in place, allowing the artery to remain open.  Peripheral Bypass Surgery. This is a surgical procedure that reroutes the blood around a blocked artery to help improve blood flow. This type of procedure may be performed if Angioplasty or stent implants are not an option. SEEK IMMEDIATE MEDICAL CARE IF:   You develop pain or numbness in your arms or legs.  Your arm or leg turns cold, becomes blue in color.  You develop redness, warmth, swelling and pain in your arms or legs. MAKE SURE YOU:   Understand these instructions.  Will watch your condition.  Will get help right away if you are not doing well or get worse. Document Released: 05/24/2004 Document Revised: 07/09/2011 Document Reviewed: 04/20/2008 [facility] Patient Information 2014 [place], [place].

## 2013-09-28 NOTE — Progress Notes (Signed)
Patient didn't want to wear CPAP.  RT will continue to monitor.

## 2013-09-28 NOTE — Progress Notes (Addendum)
Subjective: No SOB  Objective: Vital signs in last 24 hours: Temp:  [98.7 F (37.1 C)-98.9 F (37.2 C)] 98.7 F (37.1 C) (06/01 0605) Pulse Rate:  [51-58] 51 (06/01 0605) Resp:  [16-17] 16 (06/01 0605) BP: (94-146)/(38-76) 142/76 mmHg (06/01 0616) SpO2:  [95 %-98 %] 96 % (06/01 0605) Weight:  [181 lb 8 oz (82.328 kg)-184 lb 8 oz (83.689 kg)] 181 lb 8 oz (82.328 kg) (06/01 0616) Last BM Date: 09/27/13  Intake/Output from previous day: 05/31 0701 - 06/01 0700 In: 540 [P.O.:540] Out: 450 [Urine:450] Intake/Output this shift:    Medications Current Facility-Administered Medications  Medication Dose Route Frequency Provider Last Rate Last Dose  . 0.9 %  sodium chloride infusion  250 mL Intravenous PRN Sueanne Margarita, MD      . ALPRAZolam Duanne Moron) tablet 0.5 mg  0.5 mg Oral BID PRN Sueanne Margarita, MD      . aspirin EC tablet 81 mg  81 mg Oral Daily Sueanne Margarita, MD   81 mg at 09/27/13 0911  . buPROPion (WELLBUTRIN XL) 24 hr tablet 150 mg  150 mg Oral BID Sueanne Margarita, MD   150 mg at 09/27/13 2213  . carvedilol (COREG) tablet 12.5 mg  12.5 mg Oral BID WC Darlin Coco, MD   12.5 mg at 09/27/13 1751  . fluticasone (FLONASE) 50 MCG/ACT nasal spray 2 spray  2 spray Each Nare Daily Sueanne Margarita, MD   2 spray at 09/27/13 0913  . furosemide (LASIX) tablet 40 mg  40 mg Oral BID Darlin Coco, MD   40 mg at 09/27/13 1751  . heparin injection 5,000 Units  5,000 Units Subcutaneous 3 times per day Sueanne Margarita, MD   5,000 Units at 09/28/13 [phone]  . isosorbide mononitrate (IMDUR) 24 hr tablet 30 mg  30 mg Oral Daily Sueanne Margarita, MD   30 mg at 09/27/13 0912  . lactose free nutrition (BOOST PLUS) liquid 237 mL  237 mL Oral BID BM Christie Beckers, RD   237 mL at 09/27/13 1507  . nitroGLYCERIN (NITROSTAT) SL tablet 0.4 mg  0.4 mg Sublingual Q5 min PRN Sueanne Margarita, MD      . simvastatin (ZOCOR) tablet 20 mg  20 mg Oral q1800 Sueanne Margarita, MD   20 mg at 09/27/13 1750  . sodium  chloride 0.9 % injection 3 mL  3 mL Intravenous Q12H Sueanne Margarita, MD   3 mL at 09/27/13 2213  . sodium chloride 0.9 % injection 3 mL  3 mL Intravenous PRN Sueanne Margarita, MD        PE: General appearance: alert, cooperative and no distress Lungs: clear to auscultation bilaterally Heart: regular rate and rhythm and 1/6 sys MM in the RSB Extremities: No LEE Pulses: 2+ and symmetric Skin: Warm and dry.  2-3 Cm dry ulcer in the medial LLE Neurologic: Grossly normal  Lab Results:   Recent Labs  09/25/13 1618 09/26/13 0020  WBC 8.2 7.0  HGB 12.3* 11.9*  HCT 37.3* 35.0*  PLT 195 184   BMET  Recent Labs  09/26/13 0447 09/27/13 0600 09/28/13 0600  NA 140 141 140  K 3.4* 3.7 3.9  CL 102 102 100  CO2 25 26 28   GLUCOSE 181* 85 93  BUN 41* 42* 38*  CREATININE 2.39* 2.22* 2.04*  CALCIUM 9.0 8.8 8.7   Echo Study Conclusions  - Left ventricle: The cavity size was normal. Wall thickness  was increased in a pattern of mild LVH. Systolic function was normal. The estimated ejection fraction was in the range of 60% to 65%. Features are consistent with a pseudonormal left ventricular filling pattern, with concomitant abnormal relaxation and increased filling pressure (grade 2 diastolic dysfunction). - Mitral valve: There was moderate regurgitation.    Assessment/Plan 1. Acute on chronic diastolic CHF -  Net fluids: +54ml/-817ml.   Plus unmeasured urine.  He looks euvolemic.  Discussed daily weight monitoring.  BP [phone].  Follow up in the office.   Continue lasix 40mg  BID.  Closest follow up is on the 19th.  2. Acute on CKD -   Mild improvement in SCr. 3. Hypotension resolved  4. Transient bradycardia -  Does slow into the 50's 5. 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 OSA on CPAP  7. HTN stable  8. Superficial ulcer left lower leg  9.  PAD ABIs Right 0.56 Left 0.60.  He does have claudication.  Legs feel tired and it improves when he stops walking.  I spoke to  Dr. Gwenlyn Found and he will see in the office after LEA dopplers in our clinic on October 08, 2013.  The order was written.     LOS: 3 days    Tarri Fuller PA-C 09/28/2013 7:41 AM  I have seen and evaluated the patient this AM along with Tarri Fuller, PA. I agree with his findings, examination as well as impression recommendations.  With labile BP, will not add additional CHF medications for afterload reduction -- in setting of A on CKD, the best option if BP remains elevated would be adding hydralazine to Nitrate vs. Resarting CCB -- ARB & CCB currently being held due to hypotension on initial evaluation.    Close to euvolemic on exam - changed to PO lasix @ BID -- would continue as OP until f/u (or labs determine need to change).  Recheck BMP by weeks end.  With PAD on Dopplers - read as moderate despite ABIs suggesting severe; Will schedule with Dr. Gwenlyn Found for [facility] Consultation; no compression devices for now to avoid exacerbating wound healing; likely does have baseilne venous insufficiency as well though.  Topical Wound Care per [facility].  Anticipate d/c today on current Rx. Needs to walk with RN to ensure stability.    Unfortunately no close f/u appt available (is pt of Dr. Aundra Dubin - also sees L. Servando Snare) - will be seen on 6/19.  Needs Labs this Thurs-Friday to determine duration of increased lasix.  Leonie Man, M.D., M.S. Interventional Cardiologist   Pager # [phone] 09/28/2013

## 2013-09-28 NOTE — Consult Note (Signed)
WOC follow up on ABIs, both at 0.5.  Will not recommend compression for this reason.  Topical care updated for discharge to home. Dr. Ellyn Hack at bedside and discussed with [place] nurse. They will follow up with patient for PAD on the outpatient basis to determine extent of disease and need for further wound care.   Discussed POC with patient and bedside nurse.  Re consult if needed, will not follow at this time. Thanks  Marciel Offenberger [place], [place] [phone])

## 2013-09-29 ENCOUNTER — Telehealth: Payer: Self-pay | Admitting: Internal Medicine

## 2013-09-29 DIAGNOSIS — F419 Anxiety disorder, unspecified: Secondary | ICD-10-CM

## 2013-09-29 NOTE — Telephone Encounter (Signed)
Pt request rx on ALPRAZolam (XANAX) 0.5 MG tablet

## 2013-09-29 NOTE — Telephone Encounter (Signed)
Pt is going to need an OV, schedule with any provider.  [facility] or [employer]

## 2013-09-30 ENCOUNTER — Telehealth: Payer: Self-pay | Admitting: Cardiology

## 2013-09-30 MED ORDER — ALPRAZOLAM 0.5 MG PO TABS: 0.5000 mg | ORAL_TABLET | Freq: Two times a day (BID) | ORAL | Status: DC | PRN

## 2013-09-30 NOTE — Telephone Encounter (Signed)
Discussed with Patty.

## 2013-09-30 NOTE — Telephone Encounter (Signed)
Patty HHN at home seeing patient. Patients weight is up 4.6 pounds in 24 hours. HR 48 reg, blood pressure 118/56. Patient just d/c from the hospital Monday. Per nurse patient only feeling sleepy but has sleep apnea. Patients current dose of Lasix is 40 mg twice a day and K+20 meq daily. Will discuss with Dr Ron Parker and call nurse back

## 2013-09-30 NOTE — Telephone Encounter (Signed)
Follow up     Pt is not taking medication like she thought he was.  Please call

## 2013-09-30 NOTE — Telephone Encounter (Signed)
Pt [facility] phar is [facility]. Pt is out

## 2013-09-30 NOTE — Telephone Encounter (Signed)
rx faxed for 1 month supply, please schedule

## 2013-09-30 NOTE — Telephone Encounter (Signed)
Follow up     Pt has gained 5 lbs since yesterday and has a heart rate of 48.  Please advise

## 2013-09-30 NOTE — Telephone Encounter (Signed)
Patient is having swelling in feet per HHN. Did discuss with Dr Ron Parker and he was recommending increasing the Lasix and have HHN follow up tomorrow. Advised Patty and when she went to fix his medication pill box (filled at pharmacy) she realized medications were not correct from hospital d/c. Discussed again with Dr Ron Parker and he recommended getting on d/c medications as recommended and educating patient on decreased sodium and fluid intake. Patty did say patient finally admitted to not taking evening medications. She confirmed educating patient on sodium intake, fluid restriction, and importance in taking medications as directed. She will call back tomorrow with update. Will forward to [place] and Dr Aundra Dubin so they will be aware

## 2013-09-30 NOTE — Telephone Encounter (Signed)
Patient was just released from the hospital a couple days ago. He has gained 5lbs in 1 day. Please call and advise.

## 2013-10-01 ENCOUNTER — Other Ambulatory Visit: Payer: Self-pay | Admitting: *Deleted

## 2013-10-01 ENCOUNTER — Encounter: Payer: Self-pay | Admitting: Physician Assistant

## 2013-10-01 ENCOUNTER — Telehealth: Payer: Self-pay | Admitting: Internal Medicine

## 2013-10-01 MED ORDER — POTASSIUM CHLORIDE ER 10 MEQ PO TBCR: 20.0000 meq | EXTENDED_RELEASE_TABLET | Freq: Every day | ORAL | Status: DC

## 2013-10-01 MED ORDER — ISOSORBIDE MONONITRATE ER 30 MG PO TB24: 30.0000 mg | ORAL_TABLET | Freq: Every day | ORAL | Status: DC

## 2013-10-01 MED ORDER — NITROGLYCERIN 0.4 MG SL SUBL: 0.4000 mg | SUBLINGUAL_TABLET | SUBLINGUAL | Status: DC | PRN

## 2013-10-01 NOTE — Telephone Encounter (Signed)
Home health nurse from [place] called to clarify medication sent to pharmacy, she states patient has been non compliant with his medications, I sent scripts in for 3 medications that were not in the home, imdur, nitroglycerin, potassium. She states PCP is managing the statin. [place] and confirmed they did have scripts for lasix and carvedilol. Home health nurse is going to draw BMET, I informed her to fax to this office and [facility] for Dr Gwenlyn Found.

## 2013-10-01 NOTE — Telephone Encounter (Addendum)
Nurse w/ [facility] would like you to know the reading of pt's BP  today  92/50  Repeat / 98/48  No reading in rt arm  Pulse 46  W/ machine   50 manual 97% RA Pt is asymptomatic states he feels fine.   Pt will be resting if you wantmto cb Refused to speak w/ triage, b/c nurse is going to leave in the next few minutes. Pt will be home to speak w/ someone.

## 2013-10-02 ENCOUNTER — Observation Stay (HOSPITAL_COMMUNITY)
Admission: EM | Admit: 2013-10-02 | Discharge: 2013-10-04 | Disposition: A | Discharge status: Home / Self Care | Payer: Medicare Other | Attending: Cardiology | Admitting: Cardiology

## 2013-10-02 ENCOUNTER — Telehealth: Payer: Self-pay | Admitting: Cardiovascular Disease

## 2013-10-02 ENCOUNTER — Encounter (HOSPITAL_COMMUNITY): Payer: Self-pay | Admitting: Emergency Medicine

## 2013-10-02 ENCOUNTER — Emergency Department (HOSPITAL_COMMUNITY): Payer: Medicare Other

## 2013-10-02 DIAGNOSIS — I129 Hypertensive chronic kidney disease with stage 1 through stage 4 chronic kidney disease, or unspecified chronic kidney disease: Secondary | ICD-10-CM | POA: Insufficient documentation

## 2013-10-02 DIAGNOSIS — R1310 Dysphagia, unspecified: Secondary | ICD-10-CM | POA: Insufficient documentation

## 2013-10-02 DIAGNOSIS — I5032 Chronic diastolic (congestive) heart failure: Secondary | ICD-10-CM

## 2013-10-02 DIAGNOSIS — I5033 Acute on chronic diastolic (congestive) heart failure: Secondary | ICD-10-CM | POA: Diagnosis not present

## 2013-10-02 DIAGNOSIS — E43 Unspecified severe protein-calorie malnutrition: Secondary | ICD-10-CM

## 2013-10-02 DIAGNOSIS — I959 Hypotension, unspecified: Secondary | ICD-10-CM | POA: Insufficient documentation

## 2013-10-02 DIAGNOSIS — N183 Chronic kidney disease, stage 3 unspecified: Secondary | ICD-10-CM | POA: Diagnosis present

## 2013-10-02 DIAGNOSIS — E785 Hyperlipidemia, unspecified: Secondary | ICD-10-CM | POA: Insufficient documentation

## 2013-10-02 DIAGNOSIS — F3289 Other specified depressive episodes: Secondary | ICD-10-CM | POA: Insufficient documentation

## 2013-10-02 DIAGNOSIS — R262 Difficulty in walking, not elsewhere classified: Secondary | ICD-10-CM | POA: Diagnosis not present

## 2013-10-02 DIAGNOSIS — I739 Peripheral vascular disease, unspecified: Secondary | ICD-10-CM | POA: Diagnosis not present

## 2013-10-02 DIAGNOSIS — Z85828 Personal history of other malignant neoplasm of skin: Secondary | ICD-10-CM | POA: Insufficient documentation

## 2013-10-02 DIAGNOSIS — I2582 Chronic total occlusion of coronary artery: Secondary | ICD-10-CM | POA: Insufficient documentation

## 2013-10-02 DIAGNOSIS — N184 Chronic kidney disease, stage 4 (severe): Secondary | ICD-10-CM | POA: Insufficient documentation

## 2013-10-02 DIAGNOSIS — I2510 Atherosclerotic heart disease of native coronary artery without angina pectoris: Secondary | ICD-10-CM | POA: Insufficient documentation

## 2013-10-02 DIAGNOSIS — Z9989 Dependence on other enabling machines and devices: Secondary | ICD-10-CM

## 2013-10-02 DIAGNOSIS — Z951 Presence of aortocoronary bypass graft: Secondary | ICD-10-CM | POA: Diagnosis present

## 2013-10-02 DIAGNOSIS — F329 Major depressive disorder, single episode, unspecified: Secondary | ICD-10-CM | POA: Insufficient documentation

## 2013-10-02 DIAGNOSIS — I498 Other specified cardiac arrhythmias: Principal | ICD-10-CM | POA: Insufficient documentation

## 2013-10-02 DIAGNOSIS — I509 Heart failure, unspecified: Secondary | ICD-10-CM | POA: Insufficient documentation

## 2013-10-02 DIAGNOSIS — I10 Essential (primary) hypertension: Secondary | ICD-10-CM

## 2013-10-02 DIAGNOSIS — G4730 Sleep apnea, unspecified: Secondary | ICD-10-CM

## 2013-10-02 DIAGNOSIS — N179 Acute kidney failure, unspecified: Secondary | ICD-10-CM | POA: Diagnosis not present

## 2013-10-02 DIAGNOSIS — Z7982 Long term (current) use of aspirin: Secondary | ICD-10-CM | POA: Diagnosis not present

## 2013-10-02 DIAGNOSIS — G4733 Obstructive sleep apnea (adult) (pediatric): Secondary | ICD-10-CM | POA: Insufficient documentation

## 2013-10-02 DIAGNOSIS — R001 Bradycardia, unspecified: Secondary | ICD-10-CM

## 2013-10-02 HISTORY — DX: Chronic kidney disease, stage 4 (severe): N18.4

## 2013-10-02 HISTORY — DX: Bradycardia, unspecified: R00.1

## 2013-10-02 HISTORY — DX: Peripheral vascular disease, unspecified: I73.9

## 2013-10-02 HISTORY — DX: Dependence on other enabling machines and devices: Z99.89

## 2013-10-02 HISTORY — DX: Hypotension, unspecified: I95.9

## 2013-10-02 HISTORY — DX: Obstructive sleep apnea (adult) (pediatric): G47.33

## 2013-10-02 LAB — BASIC METABOLIC PANEL
BUN: 65 mg/dL — AB (ref 6–23)
CALCIUM: 9.2 mg/dL (ref 8.4–10.5)
CHLORIDE: 99 meq/L (ref 96–112)
CO2: 25 mEq/L (ref 19–32)
CREATININE: 3.1 mg/dL — AB (ref 0.50–1.35)
GFR calc non Af Amer: 17 mL/min — ABNORMAL LOW (ref >90)
GFR, EST AFRICAN AMERICAN: 20 mL/min — AB (ref >90)
Glucose, Bld: 83 mg/dL (ref 70–99)
Potassium: 4.6 mEq/L (ref 3.7–5.3)
Sodium: 139 mEq/L (ref 137–147)

## 2013-10-02 LAB — CBC WITH DIFFERENTIAL/PLATELET
Basophils Absolute: 0 10*3/uL (ref 0.0–0.1)
Basophils Relative: 0 % (ref 0–1)
EOS PCT: 1 % (ref 0–5)
Eosinophils Absolute: 0.1 10*3/uL (ref 0.0–0.7)
HCT: 35.3 % — ABNORMAL LOW (ref 39.0–52.0)
HEMOGLOBIN: 11.6 g/dL — AB (ref 13.0–17.0)
Lymphocytes Relative: 13 % (ref 12–46)
Lymphs Abs: 1.2 10*3/uL (ref 0.7–4.0)
MCH: 28.5 pg (ref 26.0–34.0)
MCHC: 32.9 g/dL (ref 30.0–36.0)
MCV: 86.7 fL (ref 78.0–100.0)
MONOS PCT: 13 % — AB (ref 3–12)
Monocytes Absolute: 1.2 10*3/uL — ABNORMAL HIGH (ref 0.1–1.0)
Neutro Abs: 6.5 10*3/uL (ref 1.7–7.7)
Neutrophils Relative %: 73 % (ref 43–77)
Platelets: 176 10*3/uL (ref 150–400)
RBC: 4.07 MIL/uL — ABNORMAL LOW (ref 4.22–5.81)
RDW: 16.2 % — ABNORMAL HIGH (ref 11.5–15.5)
WBC: 9 10*3/uL (ref 4.0–10.5)

## 2013-10-02 LAB — CBC
HCT: 35.2 % — ABNORMAL LOW (ref 39.0–52.0)
HEMOGLOBIN: 11.6 g/dL — AB (ref 13.0–17.0)
MCH: 28.5 pg (ref 26.0–34.0)
MCHC: 33 g/dL (ref 30.0–36.0)
MCV: 86.5 fL (ref 78.0–100.0)
Platelets: 163 10*3/uL (ref 150–400)
RBC: 4.07 MIL/uL — ABNORMAL LOW (ref 4.22–5.81)
RDW: 16.1 % — ABNORMAL HIGH (ref 11.5–15.5)
WBC: 8 10*3/uL (ref 4.0–10.5)

## 2013-10-02 LAB — CREATININE, SERUM
Creatinine, Ser: 3.07 mg/dL — ABNORMAL HIGH (ref 0.50–1.35)
GFR calc Af Amer: 20 mL/min — ABNORMAL LOW (ref >90)
GFR, EST NON AFRICAN AMERICAN: 17 mL/min — AB (ref >90)

## 2013-10-02 LAB — I-STAT TROPONIN, ED: Troponin i, poc: 0.01 ng/mL (ref 0.00–0.08)

## 2013-10-02 LAB — PRO B NATRIURETIC PEPTIDE: Pro B Natriuretic peptide (BNP): 1868 pg/mL — ABNORMAL HIGH (ref 0–450)

## 2013-10-02 MED ORDER — BUPROPION HCL ER (XL) 150 MG PO TB24
150.0000 mg | ORAL_TABLET | Freq: Two times a day (BID) | ORAL | Status: DC
  Administered 2013-10-02 – 2013-10-04 (×4): 150 mg via ORAL
  Filled 2013-10-02 (×5): qty 1

## 2013-10-02 MED ORDER — ACETAMINOPHEN 325 MG PO TABS
650.0000 mg | ORAL_TABLET | Freq: Four times a day (QID) | ORAL | Status: DC | PRN
  Administered 2013-10-02: 650 mg via ORAL
  Filled 2013-10-02: qty 2

## 2013-10-02 MED ORDER — NITROGLYCERIN 0.4 MG SL SUBL: 0.4000 mg | SUBLINGUAL_TABLET | SUBLINGUAL | Status: DC | PRN

## 2013-10-02 MED ORDER — ASPIRIN EC 81 MG PO TBEC
81.0000 mg | DELAYED_RELEASE_TABLET | Freq: Every day | ORAL | Status: DC
  Administered 2013-10-02 – 2013-10-04 (×3): 81 mg via ORAL
  Filled 2013-10-02 (×3): qty 1

## 2013-10-02 MED ORDER — ALPRAZOLAM 0.5 MG PO TABS
0.5000 mg | ORAL_TABLET | Freq: Two times a day (BID) | ORAL | Status: DC | PRN
  Administered 2013-10-02 – 2013-10-03 (×2): 0.5 mg via ORAL
  Filled 2013-10-02 (×2): qty 1

## 2013-10-02 MED ORDER — FLUTICASONE PROPIONATE 50 MCG/ACT NA SUSP
2.0000 | Freq: Every day | NASAL | Status: DC
  Filled 2013-10-02: qty 16

## 2013-10-02 MED ORDER — HEPARIN SODIUM (PORCINE) 5000 UNIT/ML IJ SOLN
5000.0000 [IU] | Freq: Three times a day (TID) | INTRAMUSCULAR | Status: DC
  Administered 2013-10-02 – 2013-10-04 (×5): 5000 [IU] via SUBCUTANEOUS
  Filled 2013-10-02 (×7): qty 1

## 2013-10-02 MED ORDER — SODIUM CHLORIDE 0.9 % IV SOLN
INTRAVENOUS | Status: DC
  Administered 2013-10-02: 23:00:00 via INTRAVENOUS

## 2013-10-02 MED ORDER — SIMVASTATIN 20 MG PO TABS
20.0000 mg | ORAL_TABLET | Freq: Every day | ORAL | Status: DC
  Administered 2013-10-03: 20 mg via ORAL
  Filled 2013-10-02 (×2): qty 1

## 2013-10-02 NOTE — Telephone Encounter (Signed)
Closed encounter °

## 2013-10-02 NOTE — Telephone Encounter (Signed)
Call service called back in regards to waiting for a call back due to patients low blood pressure, spoke with montrice (only cma in building), advised patient to go to urgent care.

## 2013-10-02 NOTE — ED Provider Notes (Signed)
CSN: [id number]     Arrival date & time 10/02/13  1711 History   First MD Initiated Contact with Patient 10/02/13 1721     Chief Complaint  Patient presents with  . Bradycardia     (Consider location/radiation/quality/duration/timing/severity/associated sxs/prior Treatment) Patient is a 80 y.o. male presenting with general illness. The history is provided by the patient and medical records.  Illness Severity:  Moderate Onset quality:  Sudden Timing:  Constant Progression:  Unchanged Chronicity:  Recurrent Associated symptoms: fatigue   Associated symptoms: no abdominal pain, no chest pain, no congestion, no cough, no diarrhea, no fever, no headaches, no nausea, no rhinorrhea, no shortness of breath and no vomiting     80 yo male pw bradycardia. Recent presentation for same. Patient endorses generalized fatigue for past few weeks credited to stress at home. Denies any other complaints. Says he is feeling well. Home health nurse reportedly found patient to be hypotensive to 16'R systolic. Upon EMS arrival, patient with BP in 678'L systolic en route. HR's 40-50's. A&Ox4 with them. Denies any medication changes or taking more than prescribed.  No fevers or recent illness.   Past Medical History  Diagnosis Date  . CAD (coronary artery disease)     a. no known MImultiple PCIs;  b. S/P CABG;  c. all vein grafts occluded, LIMA patent by cath 08/2008.  Marland Kitchen Gout   . Macular degeneration     followed at [facility]  . PVD (peripheral vascular disease)   . OSA (obstructive sleep apnea)     using CPAP  . Diastolic heart failure     a. 08/2013 Echo: EF 60-65%, mild LVH, Gr 2 DD, mod MR.  Marland Kitchen Hypertension   . Hyperlipidemia   . Depression     symptoms  . CKD (chronic kidney disease), stage IV   . Renal adenoma     hx  . Nephrolithiasis     hx   . Hx of skin cancer, basal cell   . AAA (abdominal aortic aneurysm) 2002    3.2 cm, with repair  . History of tobacco use     Remote (same for alcohol  use)  . Claudication     a. 08/2013 ABI's R: 0.56, L 0.60 - f/u with Dr. Gwenlyn Found scheduled.  . Bradycardia     a. 08/2013  . Hypotension     a. 08/2013   Past Surgical History  Procedure Laterality Date  . Abdominal aortic aneurysm repair    . Transurethral resection of prostate    . Coronary artery bypass graft  1990    s/p multiple PCIs with occlusion of al SVGs and only patnet LIMA at cath 5/10   . Cholecystectomy    . Lumbar disc surgery      x5  . Coronary stent placement      multiple; 12/02  . Back surgery    . Prostate surgery     Family History  Problem Relation Age of Onset  . Emphysema Father   . Heart disease Paternal Uncle     x2   History  Substance Use Topics  . Smoking status: Former Smoker -- 2.00 packs/day for 52 years    Quit date: 04/30/1988  . Smokeless tobacco: Not on file     Comment: smoked x52 years; up to 2 ppd   . Alcohol Use: 1.2 oz/week    2 Shots of liquor per week     Comment: 2 drink a day    Review of  Systems  Constitutional: Positive for fatigue. Negative for fever and chills.  HENT: Negative for congestion and rhinorrhea.   Respiratory: Negative for cough and shortness of breath.   Cardiovascular: Negative for chest pain and leg swelling.  Gastrointestinal: Negative for nausea, vomiting, abdominal pain and diarrhea.  Genitourinary: Negative for flank pain and difficulty urinating.  Musculoskeletal: Negative for back pain.  Neurological: Negative for dizziness and headaches.  All other systems reviewed and are negative.     Allergies  Review of patient's allergies indicates no known allergies.  Home Medications   Prior to Admission medications   Medication Sig Start Date End Date Taking? Authorizing Provider  ALPRAZolam Duanne Moron) 0.5 MG tablet Take 0.5 mg by mouth 2 (two) times daily as needed for anxiety.   Yes Historical Provider, MD  aspirin EC 81 MG EC tablet Take 1 tablet (81 mg total) by mouth daily. 09/28/13  Yes Tarri Fuller, PA-C  buPROPion (WELLBUTRIN XL) 150 MG 24 hr tablet Take 150 mg by mouth 2 (two) times daily.   Yes Historical Provider, MD  carvedilol (COREG) 12.5 MG tablet Take 1 tablet (12.5 mg total) by mouth 2 (two) times daily with a meal. 09/28/13  Yes Tarri Fuller, PA-C  fluticasone (FLONASE) 50 MCG/ACT nasal spray Place 2 sprays into both nostrils daily. 07/13/13  Yes Burtis Junes, NP  furosemide (LASIX) 40 MG tablet Take 1 tablet (40 mg total) by mouth 2 (two) times daily. 09/28/13  Yes Tarri Fuller, PA-C  isosorbide mononitrate (IMDUR) 30 MG 24 hr tablet Take 1 tablet (30 mg total) by mouth daily. 10/01/13  Yes Larey Dresser, MD  nitroGLYCERIN (NITROSTAT) 0.4 MG SL tablet Place 0.4 mg under the tongue every 5 (five) minutes as needed for chest pain.   Yes Historical Provider, MD  potassium chloride (K-DUR) 10 MEQ tablet Take 20 mEq by mouth daily.   Yes Historical Provider, MD  simvastatin (ZOCOR) 20 MG tablet Take 20 mg by mouth at bedtime.    Yes Historical Provider, MD   BP 121/52  Pulse 46  Temp(Src) 98.7 F (37.1 C) (Oral)  Resp 18  SpO2 98% Physical Exam  Nursing note and vitals reviewed. Constitutional: He is oriented to person, place, and time. He appears well-developed and well-nourished. No distress.  HENT:  Head: Normocephalic and atraumatic.  Eyes: Conjunctivae are normal. Right eye exhibits no discharge. Left eye exhibits no discharge.  Neck: No tracheal deviation present.  Cardiovascular: Regular rhythm, normal heart sounds and intact distal pulses.   Sinus bradycardia.  Pulmonary/Chest: Effort normal and breath sounds normal. No stridor. No respiratory distress. He has no wheezes. He has no rales.  Abdominal: Soft. He exhibits no distension. There is no tenderness. There is no guarding.  Musculoskeletal: He exhibits edema (mild BLE). He exhibits no tenderness.  Neurological: He is alert and oriented to person, place, and time.  Skin: Skin is warm and dry.  Psychiatric: He  has a normal mood and affect. His behavior is normal.    ED Course  Procedures (including critical care time) Labs Review Labs Reviewed  PRO B NATRIURETIC PEPTIDE - Abnormal; Notable for the following:    Pro B Natriuretic peptide (BNP) 1868.0 (*)    All other components within normal limits  CBC WITH DIFFERENTIAL - Abnormal; Notable for the following:    RBC 4.07 (*)    Hemoglobin 11.6 (*)    HCT 35.3 (*)    RDW 16.2 (*)    Monocytes Relative 13 (*)  Monocytes Absolute 1.2 (*)    All other components within normal limits  BASIC METABOLIC PANEL - Abnormal; Notable for the following:    BUN 65 (*)    Creatinine, Ser 3.10 (*)    GFR calc non Af Amer 17 (*)    GFR calc Af Amer 20 (*)    All other components within normal limits  CBC - Abnormal; Notable for the following:    RBC 4.07 (*)    Hemoglobin 11.6 (*)    HCT 35.2 (*)    RDW 16.1 (*)    All other components within normal limits  CREATININE, SERUM - Abnormal; Notable for the following:    Creatinine, Ser 3.07 (*)    GFR calc non Af Amer 17 (*)    GFR calc Af Amer 20 (*)    All other components within normal limits  COMPREHENSIVE METABOLIC PANEL  Randolm Idol, ED    Imaging Review Dg Chest Port 1 View  10/02/2013   CLINICAL DATA:  Bradycardia.  EXAM: PORTABLE CHEST - 1 VIEW  COMPARISON:  09/25/2013  FINDINGS: Sternotomy wires are unchanged. Lungs are adequately inflated without focal consolidation or effusion. There is mild stable cardiomegaly. Remainder the exam is unchanged.  IMPRESSION: No acute cardiopulmonary disease.  Mild stable cardiomegaly.   Electronically Signed   By: Marin Olp M.D.   On: 10/02/2013 18:02     EKG Interpretation   Date/Time:  Friday October 02 2013 17:19:06 EDT Ventricular Rate:  47 PR Interval:    QRS Duration: 103 QT Interval:  515 QTC Calculation: 455 R Axis:   79 Text Interpretation:  Sinus Bradycardia Repol abnrm suggests ischemia,  lateral leads with 1st degree A-V block  Confirmed by Kathrynn Humble, MD, Thelma Comp  [phone]) on 10/02/2013 5:27:23 PM      MDM   Final diagnoses:  Bradycardia  CHF exacerbation  AKI (acute kidney injury)    Bradycardic. Fatigue. Otherwise asymptomatic. 1st degree AV block.  No CP or SOB. Troponin negative.  HDS here. Reportedly hypotensive prior to EMS arrival.  AKI on labs.  BNP elevated to 1868.  Cards consulted.  Admit to medicine vs cards.     Bonnita Hollow, MD 10/03/13 [phone]

## 2013-10-02 NOTE — H&P (Signed)
Patient ID: Harold [name] MRN: [id number], DOB/AGE: 1928/10/20    Admit date: 10/02/2013     Primary Physician: Chancy Hurter, MD Primary Cardiologist: Einar Crow, MD / Rise MuAdora Fridge, MD    Pt. Profile:   80 y/o male with h/o CAD and diast CHF s/p recent admission for volume overload, bradycardia, and hypotn who presents back to the ED today 2/2 ongoing hypotn and bradycardia.   Problem List    Past Medical History   Diagnosis  Date   .  CAD (coronary artery disease)         a. no known MImultiple PCIs;  b. S/P CABG;  c. all vein grafts occluded, LIMA patent by cath 08/2008.   Marland Kitchen  Gout     .  Macular degeneration         followed at [facility]   .  PVD (peripheral vascular disease)     .  OSA (obstructive sleep apnea)         using CPAP   .  Diastolic heart failure         a. 08/2013 Echo: EF 60-65%, mild LVH, Gr 2 DD, mod MR.   Marland Kitchen  Hypertension     .  Hyperlipidemia     .  Depression         symptoms   .  CKD (chronic kidney disease), stage IV     .  Renal adenoma         hx   .  Nephrolithiasis         hx    .  Hx of skin cancer, basal cell     .  AAA (abdominal aortic aneurysm)  2002       3.2 cm, with repair   .  History of tobacco use         Remote (same for alcohol use)   .  Claudication         a. 08/2013 ABI's R: 0.56, L 0.60 - f/u with Dr. Gwenlyn Found scheduled.   .  Bradycardia         a. 08/2013   .  Hypotension         a. 08/2013      Past Surgical History   Procedure  Laterality  Date   .  Abdominal aortic aneurysm repair       .  Transurethral resection of prostate       .  Coronary artery bypass graft    1990       s/p multiple PCIs with occlusion of al SVGs and only patnet LIMA at cath 5/10    .  Cholecystectomy       .  Lumbar disc surgery           x5   .  Coronary stent placement           multiple; 12/02   .  Back surgery       .  Prostate surgery          Allergies   No Known Allergies   HPI   80 y/o male with the above  complex problem list.  He was recently admitted to [facility] with hypotension and bradycardia along with mild volume overload and acute on chronic renal failure.  He was treated with IV lasix x 1 and then PO lasix was resumed. His BB was reduced to 12.5mg  bid while home doses of amlodipine and losartan were  discontinued.  Echo showed nl LV fxn with diast dysfxn.  During admission, he was noted to have a slow healing ulcer on the medial left lower leg.  ABI's were done and were reduced with R of 0.56 and L of 0.60.  He was discharged home on 6/1 with plan for outpt PV f/u.  Since d/c, his BP and HR have continued to trend low (90's and 40's respectively).  There are several phone notes recording this.  Unfortunately, he never stopped taking his losartan on amlodipine.  His caretaker did not see that he was supposed to stop taking these meds on the d/c AVS and he has his meds filled weekly at his pharmacy, so he just went home and continued to take what was in his prefilled weekly med box.  Yesterday and today, he has been feeling more weak.  BP's have been running in the 70's to 90's with HR's pretty steadily in the 40's.  His caretaker called EMS today to have him taken back to the ED.  Here, HR/BP stable.  He currently offers no complaints r/t to either.  He has not been having c/p or dyspnea and his wt has been stable @ home.  He is depressed as his wife, who has dementia, traveled to [place] recently to visit her dtr and her dtr apparently has kept her down there and is looking to place her in ALF.   Home Medications    Prior to Admission medications    Medication  Sig  Start Date  End Date  Taking?  Authorizing Provider   ALPRAZolam Duanne Moron) 0.5 MG tablet  Take 1 tablet (0.5 mg total) by mouth 2 (two) times daily as needed. NEEDS OV  09/30/13      Lisabeth Pick, MD   aspirin EC 81 MG EC tablet  Take 1 tablet (81 mg total) by mouth daily.  09/28/13      Tarri Fuller, PA-C   buPROPion (WELLBUTRIN XL) 150 MG 24 hr  tablet  Take 150 mg by mouth 2 (two) times daily.        Historical Provider, MD   carvedilol (COREG) 12.5 MG tablet  Take 1 tablet (12.5 mg total) by mouth 2 (two) times daily with a meal.  09/28/13      Tarri Fuller, PA-C   fluticasone (FLONASE) 50 MCG/ACT nasal spray  Place 2 sprays into both nostrils daily.  07/13/13      Burtis Junes, NP   furosemide (LASIX) 40 MG tablet  Take 1 tablet (40 mg total) by mouth 2 (two) times daily.  09/28/13      Tarri Fuller, PA-C   isosorbide mononitrate (IMDUR) 30 MG 24 hr tablet  Take 1 tablet (30 mg total) by mouth daily.  10/01/13      Larey Dresser, MD   nitroGLYCERIN (NITROSTAT) 0.4 MG SL tablet  Place 1 tablet (0.4 mg total) under the tongue every 5 (five) minutes as needed.  10/01/13      Larey Dresser, MD   potassium chloride (K-DUR) 10 MEQ tablet  Take 2 tablets (20 mEq total) by mouth daily.  10/01/13      Larey Dresser, MD   simvastatin (ZOCOR) 20 MG tablet  Take 20 mg by mouth daily.        Historical Provider, MD      Family History    Family History   Problem  Relation  Age of Onset   .  Emphysema  Father     .  Heart disease  Paternal Uncle         x2    Social History    History       Social History   .  Marital Status:  Married       Spouse Name:  N/A       Number of Children:  N/A   .  Years of Education:  N/A       Occupational History   .  Owner of used car lot         Retired       Social History Main Topics   .  Smoking status:  Former Smoker -- 2.00 packs/day for 52 years       Quit date:  04/30/1988   .  Smokeless tobacco:  Not on file         Comment: smoked x52 years; up to 2 ppd    .  Alcohol Use:  No   .  Drug Use:  Not on file   .  Sexual Activity:  Not Currently       Other Topics  Concern   .  Not on file       Social History Narrative     Retired used [employer] officer     Does not get regular exercise     Married with children     Wife has dementia and was recently taken to [facility] to move into assisted  living by her dtr, against pts wishes.    Review of Systems General:  Wkns, malaise.  No chills, fever, night sweats or weight changes.   Cardiovascular:  No chest pain, dyspnea on exertion, edema, orthopnea, palpitations, paroxysmal nocturnal dyspnea. Dermatological: No rash, lesions/masses Respiratory: No cough, dyspnea Urologic: No hematuria, dysuria Abdominal:   He has been having difficulty swallowing and occasionally painful swallowing.  No nausea, vomiting, diarrhea, bright red blood per rectum, melena, or hematemesis Neurologic:  He is legally blind.  +++ wkns, no changes in mental status. Psych: depressed r/t situation with wife. All other systems reviewed and are otherwise negative except as noted above.   Physical Exam   Blood pressure 143/57, pulse 58, temperature 98.7 F (37.1 C), temperature source Oral, resp. rate 14, SpO2 97.00%.  General: Pleasant, NAD Psych: Normal affect. Neuro: Alert and oriented X 3. Moves all extremities spontaneously. HEENT: Legally blind.  Poor dentition.        Neck: Supple without bruits or JVD. Lungs:  Resp regular and unlabored, CTA. Heart: RRR no s3, s4, or murmurs. Abdomen: Soft, non-tender, non-distended, BS + x 4.   Extremities: No clubbing, cyanosis.  Trace bilat LE edema with dsg to left medial lower leg - D/I. DP/PT/Radials 2+ and equal bilaterally.   Labs   Troponin [facility] of Care Test) Recent Labs   10/02/13 1737   TROPIPOC  0.01    Lab Results   Component  Value  Date     WBC  9.0  10/02/2013     HGB  11.6*  10/02/2013     HCT  35.3*  10/02/2013     MCV  86.7  10/02/2013     PLT  176  10/02/2013    Recent Labs Lab  09/26/13 0020    10/02/13 1725   NA  140   < >  139   K  3.6*   < >  4.6   CL  102   < >  99   CO2  25   < >  25   BUN  40*   < >  65*   CREATININE  2.40*   < >  3.10*   CALCIUM  9.0   < >  9.2   PROT  6.4   --    --    BILITOT  0.6   --    --    ALKPHOS  63   --    --    ALT  5   --    --    AST  11    --    --    GLUCOSE  102*   < >  83    < > = values in this interval not displayed.    Radiology/Studies   Dg Chest Port 1 View   2013/10/17   CLINICAL DATA:  Bradycardia.  EXAM: PORTABLE CHEST - 1 VIEW  COMPARISON:  09/25/2013  FINDINGS: Sternotomy wires are unchanged. Lungs are adequately inflated without focal consolidation or effusion. There is mild stable cardiomegaly. Remainder the exam is unchanged.  IMPRESSION: No acute cardiopulmonary disease.  Mild stable cardiomegaly.   Electronically Signed   By: Marin Olp M.D.   On: 10/17/2013 18:02    ECG   Sb, 47, first degree AVB.   ASSESSMENT AND PLAN   1.  Bradycardia and Hypotn:  Recent admission with the same.  On last admission, coreg reduced to 12.5 bid and losartan and amlodipine were discontinued.  Pt continued to take all of his meds as previously prescribed and did not make the suggested changes.  He now presents with recurrent bradycardia, hypotn, and acute on chronic renal failure.  Will observe, d/c amlodipine/losartan.  Hold lasix for now.  BP and HR currently stable.  Will hold coreg tonight and likely plan to resume @ lower dose in AM - will reassess @ that time.   2.  Acute on chronic stage IV kidney dzs:  In setting of above.  D/C losartan.  Hold lasix.  Gently hydrate tonight.  Med changes as above.   3.  CAD: No c/p.  Known patent LIMA->LAD.  Cont med Rx with modifications based on HR/BP.   4.  Chronic diastolic CHF:  euvolemic on exam.  Holding lasix.  Follow HR/BP closely as above.     5.  Dysphagia:  Reports a several mos h/o difficulty swallowing.  He is missing several teeth on the left side of his mouth and thus chewing has not been as effective and is likely contributing.  Will ask speech to see.  Would also benefit from outpt dental/GI evals.   6.  Claudication/L LE ulceration:  Pending PV f/u with Dr. Gwenlyn Found.   7.  OSA:  CPAP.     Signed, Rogelia Mire, NP 10/17/2013, 7:35 PM p    Revision History...     Date/Time User Action   10/17/13 8:08 PM Rogelia Mire, NP Incomplete Revision   10/17/13 7:51 PM Rogelia Mire, NP Sign  View Details Report         Rogelia Mire, NP Nurse Practitioner [facility] Consult Note Service date: 10-17-2013 7:11 PM       Patient ID: Harold [name] MRN: [id number], DOB/AGE: Apr 23, 1929    Admit date: 10/17/13     Primary Physician: Chancy Hurter, MD Primary Cardiologist: Einar Crow, MD / Rise MuAdora Fridge, MD    Pt. Profile:   80 y/o male  with h/o CAD and diast CHF s/p recent admission for volume overload, bradycardia, and hypotn who presents back to the ED today 2/2 ongoing hypotn and bradycardia.   Problem List    Past Medical History   Diagnosis  Date   .  CAD (coronary artery disease)         a. no known MImultiple PCIs;  b. S/P CABG;  c. all vein grafts occluded, LIMA patent by cath 08/2008.   Marland Kitchen  Gout     .  Macular degeneration         followed at [facility]   .  PVD (peripheral vascular disease)     .  OSA (obstructive sleep apnea)         using CPAP   .  Diastolic heart failure         a. 08/2013 Echo: EF 60-65%, mild LVH, Gr 2 DD, mod MR.   Marland Kitchen  Hypertension     .  Hyperlipidemia     .  Depression         symptoms   .  CKD (chronic kidney disease), stage IV     .  Renal adenoma         hx   .  Nephrolithiasis         hx    .  Hx of skin cancer, basal cell     .  AAA (abdominal aortic aneurysm)  2002       3.2 cm, with repair   .  History of tobacco use         Remote (same for alcohol use)   .  Claudication         a. 08/2013 ABI's R: 0.56, L 0.60 - f/u with Dr. Gwenlyn Found scheduled.   .  Bradycardia         a. 08/2013   .  Hypotension         a. 08/2013      Past Surgical History   Procedure  Laterality  Date   .  Abdominal aortic aneurysm repair       .  Transurethral resection of prostate       .  Coronary artery bypass graft    1990       s/p multiple PCIs  with occlusion of al SVGs and only patnet LIMA at cath 5/10    .  Cholecystectomy       .  Lumbar disc surgery           x5   .  Coronary stent placement           multiple; 12/02   .  Back surgery       .  Prostate surgery          Allergies   No Known Allergies   HPI   80 y/o male with the above complex problem list.  He was recently admitted to [facility] with hypotension and bradycardia along with mild volume overload and acute on chronic renal failure.  He was treated with IV lasix x 1 and then PO lasix was resumed. His BB was reduced to 12.5mg  bid while home doses of amlodipine and losartan were discontinued.  Echo showed nl LV fxn with diast dysfxn.  During admission, he was noted to have a slow healing ulcer on the medial left lower leg.  ABI's were done and were reduced with R of 0.56 and L of 0.60.  He was discharged  home on 6/1 with plan for outpt PV f/u.  Since d/c, his BP and HR have continued to trend low (90's and 40's respectively).  There are several phone notes recording this.  Unfortunately, he never stopped taking his losartan on amlodipine.  His caretaker did not see that he was supposed to stop taking these meds on the d/c AVS and he has his meds filled weekly at his pharmacy, so he just went home and continued to take what was in his prefilled weekly med box.  Yesterday and today, he has been feeling more weak.  BP's have been running in the 70's to 90's with HR's pretty steadily in the 40's.  His caretaker called EMS today to have him taken back to the ED.  Here, HR/BP stable.  He currently offers no complaints r/t to either.  He has not been having c/p or dyspnea and his wt has been stable @ home.  He is depressed as his wife, who has dementia, traveled to [place] recently to visit her dtr and her dtr apparently has kept her down there and is looking to place her in ALF.   Home Medications    Prior to Admission medications    Medication  Sig  Start Date  End Date  Taking?   Authorizing Provider   ALPRAZolam Duanne Moron) 0.5 MG tablet  Take 1 tablet (0.5 mg total) by mouth 2 (two) times daily as needed. NEEDS OV  09/30/13      Lisabeth Pick, MD   aspirin EC 81 MG EC tablet  Take 1 tablet (81 mg total) by mouth daily.  09/28/13      Tarri Fuller, PA-C   buPROPion (WELLBUTRIN XL) 150 MG 24 hr tablet  Take 150 mg by mouth 2 (two) times daily.        Historical Provider, MD   carvedilol (COREG) 12.5 MG tablet  Take 1 tablet (12.5 mg total) by mouth 2 (two) times daily with a meal.  09/28/13      Tarri Fuller, PA-C   fluticasone (FLONASE) 50 MCG/ACT nasal spray  Place 2 sprays into both nostrils daily.  07/13/13      Burtis Junes, NP   furosemide (LASIX) 40 MG tablet  Take 1 tablet (40 mg total) by mouth 2 (two) times daily.  09/28/13      Tarri Fuller, PA-C   isosorbide mononitrate (IMDUR) 30 MG 24 hr tablet  Take 1 tablet (30 mg total) by mouth daily.  10/01/13      Larey Dresser, MD   nitroGLYCERIN (NITROSTAT) 0.4 MG SL tablet  Place 1 tablet (0.4 mg total) under the tongue every 5 (five) minutes as needed.  10/01/13      Larey Dresser, MD   potassium chloride (K-DUR) 10 MEQ tablet  Take 2 tablets (20 mEq total) by mouth daily.  10/01/13      Larey Dresser, MD   simvastatin (ZOCOR) 20 MG tablet  Take 20 mg by mouth daily.        Historical Provider, MD      Family History    Family History   Problem  Relation  Age of Onset   .  Emphysema  Father     .  Heart disease  Paternal Uncle         x2    Social History    History       Social History   .  Marital Status:  Married  Spouse Name:  N/A       Number of Children:  N/A   .  Years of Education:  N/A       Occupational History   .  Owner of used car lot         Retired       Social History Main Topics   .  Smoking status:  Former Smoker -- 2.00 packs/day for 52 years       Quit date:  04/30/1988   .  Smokeless tobacco:  Not on file         Comment: smoked x52 years; up to 2 ppd    .  Alcohol Use:  No    .  Drug Use:  Not on file   .  Sexual Activity:  Not Currently       Other Topics  Concern   .  Not on file       Social History Narrative     Retired used [employer] officer     Does not get regular exercise     Married with children     Wife has dementia and was recently taken to [facility] to move into assisted living by her dtr, against pts wishes.    Review of Systems General:  Wkns, malaise.  No chills, fever, night sweats or weight changes.   Cardiovascular:  No chest pain, dyspnea on exertion, edema, orthopnea, palpitations, paroxysmal nocturnal dyspnea. Dermatological: No rash, lesions/masses Respiratory: No cough, dyspnea Urologic: No hematuria, dysuria Abdominal:   He has been having difficulty swallowing and occasionally painful swallowing.  No nausea, vomiting, diarrhea, bright red blood per rectum, melena, or hematemesis Neurologic:  He is legally blind.  +++ wkns, no changes in mental status. Psych: depressed r/t situation with wife. All other systems reviewed and are otherwise negative except as noted above.   Physical Exam   Blood pressure 143/57, pulse 58, temperature 98.7 F (37.1 C), temperature source Oral, resp. rate 14, SpO2 97.00%.  General: Pleasant, NAD Psych: Normal affect. Neuro: Alert and oriented X 3. Moves all extremities spontaneously. HEENT: Legally blind.  Poor dentition.        Neck: Supple without bruits or JVD. Lungs:  Resp regular and unlabored, CTA. Heart: RRR no s3, s4, or murmurs. Abdomen: Soft, non-tender, non-distended, BS + x 4.   Extremities: No clubbing, cyanosis.  Trace bilat LE edema with dsg to left medial lower leg - D/I. DP/PT/Radials 2+ and equal bilaterally.   Labs   Troponin [facility] of Care Test) Recent Labs   10/02/13 1737   TROPIPOC  0.01    Lab Results   Component  Value  Date     WBC  9.0  10/02/2013     HGB  11.6*  10/02/2013     HCT  35.3*  10/02/2013     MCV  86.7  10/02/2013     PLT  176  10/02/2013    Recent Labs Lab   09/26/13 0020    10/02/13 1725   NA  140   < >  139   K  3.6*   < >  4.6   CL  102   < >  99   CO2  25   < >  25   BUN  40*   < >  65*   CREATININE  2.40*   < >  3.10*   CALCIUM  9.0   < >  9.2   PROT  6.4   --    --    BILITOT  0.6   --    --    ALKPHOS  63   --    --    ALT  5   --    --    AST  11   --    --    GLUCOSE  102*   < >  83    < > = values in this interval not displayed.    Radiology/Studies   Dg Chest Port 1 View   10/09/2013   CLINICAL DATA:  Bradycardia.  EXAM: PORTABLE CHEST - 1 VIEW  COMPARISON:  09/25/2013  FINDINGS: Sternotomy wires are unchanged. Lungs are adequately inflated without focal consolidation or effusion. There is mild stable cardiomegaly. Remainder the exam is unchanged.  IMPRESSION: No acute cardiopulmonary disease.  Mild stable cardiomegaly.   Electronically Signed   By: Marin Olp M.D.   On: 2013/10/09 18:02    ECG   Sb, 47, first degree AVB.   ASSESSMENT AND PLAN   1.  Bradycardia and Hypotn:  Recent admission with the same.  On last admission, coreg reduced to 12.5 bid and losartan and amlodipine were discontinued.  Pt continued to take all of his meds as previously prescribed and did not make the suggested changes.  He now presents with recurrent bradycardia, hypotn, and acute on chronic renal failure.  Will observe, d/c amlodipine/losartan.  Hold lasix for now.  BP and HR currently stable.  Will hold coreg tonight and likely plan to resume @ lower dose in AM - will reassess @ that time.   2.  Acute on chronic stage IV kidney dzs:  In setting of above.  D/C losartan.  Hold lasix.  Gently hydrate tonight.  Med changes as above.   3.  CAD: No c/p.  Known patent LIMA->LAD.  Cont med Rx with modifications based on HR/BP.   4.  Chronic diastolic CHF:  euvolemic on exam.  Holding lasix.  Follow HR/BP closely as above.     5.  Dysphagia:  Reports a several mos h/o difficulty swallowing.  He is missing several teeth on the left side  of his mouth and thus chewing has not been as effective and is likely contributing.  Will ask speech to see.  Would also benefit from outpt dental/GI evals.   6.  Claudication/L LE ulceration:  Pending PV f/u with Dr. Gwenlyn Found.   7.  OSA:  CPAP.     Signed, Rogelia Mire, NP 10-09-2013, 7:35 PM p    Patient seen and examined. I agree with the assessment and plan as detailed above. See also my additional thoughts below.   Unfortunately the patient has been taking medications at home that had been discontinued in the hospital. We are trying to be sure that this does not happen again. He is admitted now to stabilize his situation.  Carlena Bjornstad, MD, [facility] October 09, 2013 8:10 PM

## 2013-10-02 NOTE — ED Notes (Signed)
Report given to [address] [place] unit nurse , transported in stable condition with teletransport NT , denies chest pain / respirations unlabored , personal belongings ( t- shirt / shoes / keys ) bagged and labelled.

## 2013-10-02 NOTE — ED Notes (Signed)
Attempted to give report to [place] unit nurse.

## 2013-10-02 NOTE — Consult Note (Deleted)
Patient ID: Harold [name] MRN: [id number], DOB/AGE: August 10, 1928   Admit date: 10/02/2013   Primary Physician: Chancy Hurter, MD Primary Cardiologist: Einar Crow, MD / Rise MuAdora Fridge, MD   Pt. Profile:  80 y/o male with h/o CAD and diast CHF s/p recent admission for volume overload, bradycardia, and hypotn who presents back to the ED today 2/2 ongoing hypotn and bradycardia.  Problem List  Past Medical History  Diagnosis Date  . CAD (coronary artery disease)     a. no known MImultiple PCIs;  b. S/P CABG;  c. all vein grafts occluded, LIMA patent by cath 08/2008.  Marland Kitchen Gout   . Macular degeneration     followed at [facility]  . PVD (peripheral vascular disease)   . OSA (obstructive sleep apnea)     using CPAP  . Diastolic heart failure     a. 08/2013 Echo: EF 60-65%, mild LVH, Gr 2 DD, mod MR.  Marland Kitchen Hypertension   . Hyperlipidemia   . Depression     symptoms  . CKD (chronic kidney disease), stage IV   . Renal adenoma     hx  . Nephrolithiasis     hx   . Hx of skin cancer, basal cell   . AAA (abdominal aortic aneurysm) 2002    3.2 cm, with repair  . History of tobacco use     Remote (same for alcohol use)  . Claudication     a. 08/2013 ABI's R: 0.56, L 0.60 - f/u with Dr. Gwenlyn Found scheduled.  . Bradycardia     a. 08/2013  . Hypotension     a. 08/2013    Past Surgical History  Procedure Laterality Date  . Abdominal aortic aneurysm repair    . Transurethral resection of prostate    . Coronary artery bypass graft  1990    s/p multiple PCIs with occlusion of al SVGs and only patnet LIMA at cath 5/10   . Cholecystectomy    . Lumbar disc surgery      x5  . Coronary stent placement      multiple; 12/02  . Back surgery    . Prostate surgery       Allergies  No Known Allergies  HPI  80 y/o male with the above complex problem list.  He was recently admitted to [facility] with hypotension and bradycardia along with mild volume overload and acute on chronic renal failure.  He was  treated with IV lasix x 1 and then PO lasix was resumed.  His BB was reduced to 12.5mg  bid while home doses of amlodipine and losartan were discontinued.  Echo showed nl LV fxn with diast dysfxn.  During admission, he was noted to have a slow healing ulcer on the medial left lower leg.  ABI's were done and were reduced with R of 0.56 and L of 0.60.  He was discharged home on 6/1 with plan for outpt PV f/u.  Since d/c, his BP and HR have continued to trend low (90's and 40's respectively).  There are several phone notes recording this.  Unfortunately, he never stopped taking his losartan on amlodipine.  His caretaker did not see that he was supposed to stop taking these meds on the d/c AVS and he has his meds filled weekly at his pharmacy, so he just went home and continued to take what was in his prefilled weekly med box.  Yesterday and today, he has been feeling more weak.  BP's have been  running in the 70's to 90's with HR's pretty steadily in the 40's.  His caretaker called EMS today to have him taken back to the ED.  Here, HR/BP stable.  He currently offers no complaints r/t to either.  He has not been having c/p or dyspnea and his wt has been stable @ home.  He is depressed as his wife, who has dementia, traveled to [place] recently to visit her dtr and her dtr apparently has kept her down there and is looking to place her in ALF.  Home Medications  Prior to Admission medications   Medication Sig Start Date End Date Taking? Authorizing Provider  ALPRAZolam Duanne Moron) 0.5 MG tablet Take 1 tablet (0.5 mg total) by mouth 2 (two) times daily as needed. NEEDS OV 09/30/13   Lisabeth Pick, MD  aspirin EC 81 MG EC tablet Take 1 tablet (81 mg total) by mouth daily. 09/28/13   Tarri Fuller, PA-C  buPROPion (WELLBUTRIN XL) 150 MG 24 hr tablet Take 150 mg by mouth 2 (two) times daily.    Historical Provider, MD  carvedilol (COREG) 12.5 MG tablet Take 1 tablet (12.5 mg total) by mouth 2 (two) times daily with a meal.  09/28/13   Tarri Fuller, PA-C  fluticasone (FLONASE) 50 MCG/ACT nasal spray Place 2 sprays into both nostrils daily. 07/13/13   Burtis Junes, NP  furosemide (LASIX) 40 MG tablet Take 1 tablet (40 mg total) by mouth 2 (two) times daily. 09/28/13   Tarri Fuller, PA-C  isosorbide mononitrate (IMDUR) 30 MG 24 hr tablet Take 1 tablet (30 mg total) by mouth daily. 10/01/13   Larey Dresser, MD  nitroGLYCERIN (NITROSTAT) 0.4 MG SL tablet Place 1 tablet (0.4 mg total) under the tongue every 5 (five) minutes as needed. 10/01/13   Larey Dresser, MD  potassium chloride (K-DUR) 10 MEQ tablet Take 2 tablets (20 mEq total) by mouth daily. 10/01/13   Larey Dresser, MD  simvastatin (ZOCOR) 20 MG tablet Take 20 mg by mouth daily.    Historical Provider, MD   Family History  Family History  Problem Relation Age of Onset  . Emphysema Father   . Heart disease Paternal Uncle     x2   Social History  History   Social History  . Marital Status: Married    Spouse Name: N/A    Number of Children: N/A  . Years of Education: N/A   Occupational History  . Owner of used car lot     Retired   Social History Main Topics  . Smoking status: Former Smoker -- 2.00 packs/day for 52 years    Quit date: 04/30/1988  . Smokeless tobacco: Not on file     Comment: smoked x52 years; up to 2 ppd   . Alcohol Use: No  . Drug Use: Not on file  . Sexual Activity: Not Currently   Other Topics Concern  . Not on file   Social History Narrative   Retired used [employer] officer   Does not get regular exercise   Married with children   Wife has dementia and was recently taken to [facility] to move into assisted living by her dtr, against pts wishes.    Review of Systems General:  Wkns, malaise.  No chills, fever, night sweats or weight changes.  Cardiovascular:  No chest pain, dyspnea on exertion, edema, orthopnea, palpitations, paroxysmal nocturnal dyspnea. Dermatological: No rash, lesions/masses Respiratory: No cough, dyspnea Urologic:  No hematuria, dysuria Abdominal:  He has been having difficulty swallowing and occasionally painful swallowing.  No nausea, vomiting, diarrhea, bright red blood per rectum, melena, or hematemesis Neurologic:  He is legally blind.  +++ wkns, no changes in mental status. Psych: depressed r/t situation with wife. All other systems reviewed and are otherwise negative except as noted above.  Physical Exam  Blood pressure 143/57, pulse 58, temperature 98.7 F (37.1 C), temperature source Oral, resp. rate 14, SpO2 97.00%.  General: Pleasant, NAD Psych: Normal affect. Neuro: Alert and oriented X 3. Moves all extremities spontaneously. HEENT: Legally blind.  Poor dentition.  Neck: Supple without bruits or JVD. Lungs:  Resp regular and unlabored, CTA. Heart: RRR no s3, s4, or murmurs. Abdomen: Soft, non-tender, non-distended, BS + x 4.  Extremities: No clubbing, cyanosis.  Trace bilat LE edema with dsg to left medial lower leg - D/I. DP/PT/Radials 2+ and equal bilaterally.  Labs  Troponin [facility] of Care Test)  Recent Labs  10/02/13 1737  TROPIPOC 0.01   Lab Results  Component Value Date   WBC 9.0 10/02/2013   HGB 11.6* 10/02/2013   HCT 35.3* 10/02/2013   MCV 86.7 10/02/2013   PLT 176 10/02/2013    Recent Labs Lab 09/26/13 0020  10/02/13 1725  NA 140  < > 139  K 3.6*  < > 4.6  CL 102  < > 99  CO2 25  < > 25  BUN 40*  < > 65*  CREATININE 2.40*  < > 3.10*  CALCIUM 9.0  < > 9.2  PROT 6.4  --   --   BILITOT 0.6  --   --   ALKPHOS 63  --   --   ALT 5  --   --   AST 11  --   --   GLUCOSE 102*  < > 83  < > = values in this interval not displayed.   Radiology/Studies  Dg Chest Port 1 View  10/02/2013   CLINICAL DATA:  Bradycardia.  EXAM: PORTABLE CHEST - 1 VIEW  COMPARISON:  09/25/2013  FINDINGS: Sternotomy wires are unchanged. Lungs are adequately inflated without focal consolidation or effusion. There is mild stable cardiomegaly. Remainder the exam is unchanged.  IMPRESSION: No acute  cardiopulmonary disease.  Mild stable cardiomegaly.   Electronically Signed   By: Marin Olp M.D.   On: 10/02/2013 18:02   ECG  Sb, 47, first degree AVB.  ASSESSMENT AND PLAN  1.  Bradycardia and Hypotn:  Recent admission with the same.  On last admission, coreg reduced to 12.5 bid and losartan and amlodipine were discontinued.  Pt continued to take all of his meds as previously prescribed and did not make the suggested changes.  He now presents with recurrent bradycardia, hypotn, and acute on chronic renal failure.  Will observe, d/c amlodipine/losartan.  Hold lasix for now.  BP and HR currently stable.  Will hold coreg tonight and likely plan to resume @ lower dose in AM - will reassess @ that time.  2.  Acute on chronic stage IV kidney dzs:  In setting of above.  D/C losartan.  Hold lasix.  Gently hydrate tonight.  Med changes as above.  3.  CAD: No c/p.  Known patent LIMA->LAD.  Cont med Rx with modifications based on HR/BP.  4.  Chronic diastolic CHF:  euvolemic on exam.  Holding lasix.  Follow HR/BP closely as above.    5.  Dysphagia:  Reports a several mos h/o difficulty swallowing.  He is  missing several teeth on the left side of his mouth and thus chewing has not been as effective and is likely contributing.  Will ask speech to see.  Would also benefit from outpt dental/GI evals.  6.  Claudication/L LE ulceration:  Pending PV f/u with Dr. Gwenlyn Found.  7.  OSA:  CPAP.   Signed, Rogelia Mire, NP 10/02/2013, 7:35 PM p

## 2013-10-02 NOTE — ED Notes (Signed)
Per EMS-Pt comes from home, where home health called EMS for labile blood pressures also HR 46. Denies any pain but reports weakness. Pt is a x 4. BP 112/50, HR 46.

## 2013-10-03 DIAGNOSIS — N179 Acute kidney failure, unspecified: Secondary | ICD-10-CM

## 2013-10-03 DIAGNOSIS — R001 Bradycardia, unspecified: Secondary | ICD-10-CM | POA: Diagnosis present

## 2013-10-03 DIAGNOSIS — I5032 Chronic diastolic (congestive) heart failure: Secondary | ICD-10-CM

## 2013-10-03 DIAGNOSIS — I498 Other specified cardiac arrhythmias: Secondary | ICD-10-CM | POA: Diagnosis not present

## 2013-10-03 LAB — COMPREHENSIVE METABOLIC PANEL
ALBUMIN: 3.2 g/dL — AB (ref 3.5–5.2)
ALT: 6 U/L (ref 0–53)
AST: 11 U/L (ref 0–37)
Alkaline Phosphatase: 60 U/L (ref 39–117)
BUN: 66 mg/dL — ABNORMAL HIGH (ref 6–23)
CALCIUM: 9 mg/dL (ref 8.4–10.5)
CO2: 25 mEq/L (ref 19–32)
Chloride: 99 mEq/L (ref 96–112)
Creatinine, Ser: 3 mg/dL — ABNORMAL HIGH (ref 0.50–1.35)
GFR calc non Af Amer: 18 mL/min — ABNORMAL LOW (ref >90)
GFR, EST AFRICAN AMERICAN: 21 mL/min — AB (ref >90)
GLUCOSE: 93 mg/dL (ref 70–99)
POTASSIUM: 3.8 meq/L (ref 3.7–5.3)
SODIUM: 139 meq/L (ref 137–147)
TOTAL PROTEIN: 6.2 g/dL (ref 6.0–8.3)
Total Bilirubin: 0.7 mg/dL (ref 0.3–1.2)

## 2013-10-03 NOTE — Progress Notes (Signed)
DAILY PROGRESS NOTE  Subjective:  No issues overnight. Creatinine appears to be improving somewhat with gentle hydration. BNP is improved compared to most recent admission. HR now improved with holding b-blocker overnight - bp improved as well.  Objective:  Temp:  [97.5 F (36.4 C)-98.7 F (37.1 C)] 97.5 F (36.4 C) (06/06 0405) Pulse Rate:  [46-99] 99 (06/06 0405) Resp:  [13-21] 18 (06/06 0405) BP: (108-144)/(46-78) 114/69 mmHg (06/06 0405) SpO2:  [93 %-99 %] 94 % (06/06 0405) Weight:  [181 lb 10.5 oz (82.4 kg)-182 lb 11.2 oz (82.872 kg)] 181 lb 10.5 oz (82.4 kg) (06/06 0405) Weight change:   Intake/Output from previous day: 06/05 0701 - 06/06 0700 In: 127.5 [I.V.:127.5] Out: 875 [Urine:875]  Intake/Output from this shift:    Medications: Current Facility-Administered Medications  Medication Dose Route Frequency Provider Last Rate Last Dose  . 0.9 %  sodium chloride infusion   Intravenous Continuous Rogelia Mire, NP 50 mL/hr at 10/03/13 0526    . acetaminophen (TYLENOL) tablet 650 mg  650 mg Oral Q6H PRN Hetty Blend, MD   650 mg at 10/02/13 2348  . ALPRAZolam Duanne Moron) tablet 0.5 mg  0.5 mg Oral BID PRN Rogelia Mire, NP   0.5 mg at 10/02/13 2327  . aspirin EC tablet 81 mg  81 mg Oral Daily Rogelia Mire, NP   81 mg at 10/02/13 2254  . buPROPion (WELLBUTRIN XL) 24 hr tablet 150 mg  150 mg Oral BID Rogelia Mire, NP   150 mg at 10/02/13 2254  . fluticasone (FLONASE) 50 MCG/ACT nasal spray 2 spray  2 spray Each Nare Daily Rogelia Mire, NP      . heparin injection 5,000 Units  5,000 Units Subcutaneous 3 times per day Rogelia Mire, NP   5,000 Units at 10/03/13 0526  . nitroGLYCERIN (NITROSTAT) SL tablet 0.4 mg  0.4 mg Sublingual Q5 Min x 3 PRN Rogelia Mire, NP      . simvastatin (ZOCOR) tablet 20 mg  20 mg Oral q1800 Rogelia Mire, NP        Physical Exam: General appearance: alert and no distress Neck: no carotid  bruit and no JVD Lungs: clear to auscultation bilaterally Heart: regular bradycardia Abdomen: soft, non-tender; bowel sounds normal; no masses,  no organomegaly Extremities: extremities normal, atraumatic, no cyanosis or edema Pulses: 2+ and symmetric Skin: Skin color, texture, turgor normal. No rashes or lesions Neurologic: Grossly normal Psych: Normal mood, affect  Lab Results: Results for orders placed during the hospital encounter of 10/02/13 (from the past 48 hour(s))  PRO B NATRIURETIC PEPTIDE     Status: Abnormal   Collection Time    10/02/13  5:25 PM      Result Value Ref Range   Pro B Natriuretic peptide (BNP) 1868.0 (*) 0 - 450 pg/mL  CBC WITH DIFFERENTIAL     Status: Abnormal   Collection Time    10/02/13  5:25 PM      Result Value Ref Range   WBC 9.0  4.0 - 10.5 K/uL   RBC 4.07 (*) 4.22 - 5.81 MIL/uL   Hemoglobin 11.6 (*) 13.0 - 17.0 g/dL   HCT 35.3 (*) 39.0 - 52.0 %   MCV 86.7  78.0 - 100.0 fL   MCH 28.5  26.0 - 34.0 pg   MCHC 32.9  30.0 - 36.0 g/dL   RDW 16.2 (*) 11.5 - 15.5 %   Platelets 176  150 -  400 K/uL   Neutrophils Relative % 73  43 - 77 %   Neutro Abs 6.5  1.7 - 7.7 K/uL   Lymphocytes Relative 13  12 - 46 %   Lymphs Abs 1.2  0.7 - 4.0 K/uL   Monocytes Relative 13 (*) 3 - 12 %   Monocytes Absolute 1.2 (*) 0.1 - 1.0 K/uL   Eosinophils Relative 1  0 - 5 %   Eosinophils Absolute 0.1  0.0 - 0.7 K/uL   Basophils Relative 0  0 - 1 %   Basophils Absolute 0.0  0.0 - 0.1 K/uL  BASIC METABOLIC PANEL     Status: Abnormal   Collection Time    10/02/13  5:25 PM      Result Value Ref Range   Sodium 139  137 - 147 mEq/L   Potassium 4.6  3.7 - 5.3 mEq/L   Chloride 99  96 - 112 mEq/L   CO2 25  19 - 32 mEq/L   Glucose, Bld 83  70 - 99 mg/dL   BUN 65 (*) 6 - 23 mg/dL   Creatinine, Ser 3.10 (*) 0.50 - 1.35 mg/dL   Calcium 9.2  8.4 - 10.5 mg/dL   GFR calc non Af Amer 17 (*) >90 mL/min   GFR calc Af Amer 20 (*) >90 mL/min   Comment: (NOTE)     The eGFR has been  calculated using the CKD EPI equation.     This calculation has not been validated in all clinical situations.     eGFR's persistently <90 mL/min signify possible Chronic Kidney     Disease.  Randolm Idol, ED     Status: None   Collection Time    10/02/13  5:37 PM      Result Value Ref Range   Troponin i, poc 0.01  0.00 - 0.08 ng/mL   Comment 3            Comment: Due to the release kinetics of cTnI,     a negative result within the first hours     of the onset of symptoms does not rule out     myocardial infarction with certainty.     If myocardial infarction is still suspected,     repeat the test at appropriate intervals.  CBC     Status: Abnormal   Collection Time    10/02/13 10:27 PM      Result Value Ref Range   WBC 8.0  4.0 - 10.5 K/uL   RBC 4.07 (*) 4.22 - 5.81 MIL/uL   Hemoglobin 11.6 (*) 13.0 - 17.0 g/dL   HCT 35.2 (*) 39.0 - 52.0 %   MCV 86.5  78.0 - 100.0 fL   MCH 28.5  26.0 - 34.0 pg   MCHC 33.0  30.0 - 36.0 g/dL   RDW 16.1 (*) 11.5 - 15.5 %   Platelets 163  150 - 400 K/uL  CREATININE, SERUM     Status: Abnormal   Collection Time    10/02/13 10:27 PM      Result Value Ref Range   Creatinine, Ser 3.07 (*) 0.50 - 1.35 mg/dL   GFR calc non Af Amer 17 (*) >90 mL/min   GFR calc Af Amer 20 (*) >90 mL/min   Comment: (NOTE)     The eGFR has been calculated using the CKD EPI equation.     This calculation has not been validated in all clinical situations.     eGFR's persistently <90  mL/min signify possible Chronic Kidney     Disease.  COMPREHENSIVE METABOLIC PANEL     Status: Abnormal   Collection Time    10/03/13  4:14 AM      Result Value Ref Range   Sodium 139  137 - 147 mEq/L   Potassium 3.8  3.7 - 5.3 mEq/L   Comment: DELTA CHECK NOTED   Chloride 99  96 - 112 mEq/L   CO2 25  19 - 32 mEq/L   Glucose, Bld 93  70 - 99 mg/dL   BUN 66 (*) 6 - 23 mg/dL   Creatinine, Ser 3.00 (*) 0.50 - 1.35 mg/dL   Calcium 9.0  8.4 - 10.5 mg/dL   Total Protein 6.2  6.0 -  8.3 g/dL   Albumin 3.2 (*) 3.5 - 5.2 g/dL   AST 11  0 - 37 U/L   ALT 6  0 - 53 U/L   Alkaline Phosphatase 60  39 - 117 U/L   Total Bilirubin 0.7  0.3 - 1.2 mg/dL   GFR calc non Af Amer 18 (*) >90 mL/min   GFR calc Af Amer 21 (*) >90 mL/min   Comment: (NOTE)     The eGFR has been calculated using the CKD EPI equation.     This calculation has not been validated in all clinical situations.     eGFR's persistently <90 mL/min signify possible Chronic Kidney     Disease.    Imaging: Dg Chest Port 1 View  10/02/2013   CLINICAL DATA:  Bradycardia.  EXAM: PORTABLE CHEST - 1 VIEW  COMPARISON:  09/25/2013  FINDINGS: Sternotomy wires are unchanged. Lungs are adequately inflated without focal consolidation or effusion. There is mild stable cardiomegaly. Remainder the exam is unchanged.  IMPRESSION: No acute cardiopulmonary disease.  Mild stable cardiomegaly.   Electronically Signed   By: Marin Olp M.D.   On: 10/02/2013 18:02    Assessment:  Principal Problem:   Bradycardia Active Problems:   HYPERLIPIDEMIA   HYPERTENSION   DIASTOLIC HEART FAILURE, CHRONIC   CAD (coronary artery disease)   Acute on chronic diastolic CHF (congestive heart failure)   AKI (acute kidney injury)   Hypotension   Plan:  1. Feels fine today - was sleeping when I came in the room and HR was in the 40's. HR noted to increase overnight. Holding b-blocker - getting gentle hydration. Will d/c IVF's today, hold lasix until discharge and like resume at a lower dose.  He may have sinus node dysfunction - no clear indication for pacing at this point. Probable d/c home tomorrow morning.  Time Spent Directly with Patient:  15 minutes  Length of Stay:  LOS: 1 day   Pixie Casino, MD, [facility] Attending Cardiologist [place] 10/03/2013, 7:48 AM

## 2013-10-03 NOTE — ED Provider Notes (Signed)
I performed a history and physical examination of  Harold [name] and discussed his management with Dr. Hyman Hopes. I agree with the history, physical, assessment, and plan of care, with the following exceptions: None I was present for the following procedures: None  Time Spent in Critical Care of the patient: None  Time spent in discussions with the patient and family: 10 min  Harold [name]  Pt comes in with cc of low BP and low HR. BP normal here. HR in the 40s. With this patient is noted to have AKI. P's exam is not concerning otherwise. PT was admitted to hospital recently with the same. He had some changes to his meds bu the Cards. Cards consulted for asymptomatic bradycardia, since patient has some renal insufficiency (prerenal?), and they admitted the patient.    Harold Biles, MD 10/03/13 [phone]

## 2013-10-03 NOTE — Progress Notes (Signed)
Pt states he tried CPAP last night and was not able to get used to it. He stated he did not want to wear it tonight. RT encouraged to try again tonight but pt refused. RT will continue to monitor.

## 2013-10-03 NOTE — Progress Notes (Signed)
Nutrition Brief Note  Patient identified on the Malnutrition Screening Tool (MST) Report. Patient with some weight fluctuations recently due to recent fluid overload and now dehydration on this admission. PO intake is adequate.   Wt Readings from Last 15 Encounters:  10/03/13 181 lb 10.5 oz (82.4 kg)  09/28/13 181 lb 8 oz (82.328 kg)  07/13/13 193 lb 6.4 oz (87.726 kg)  01/13/13 194 lb (87.998 kg)  09/26/12 187 lb 1.9 oz (84.877 kg)  06/24/12 191 lb (86.637 kg)  06/03/12 192 lb (87.091 kg)  02/28/12 199 lb 12.8 oz (90.629 kg)  02/12/12 202 lb (91.627 kg)  12/24/11 200 lb 12.8 oz (91.082 kg)  08/13/11 205 lb 1.9 oz (93.042 kg)  06/04/11 210 lb 6.4 oz (95.437 kg)  04/02/11 215 lb (97.523 kg)  03/20/11 215 lb (97.523 kg)  02/22/11 209 lb 12.8 oz (95.165 kg)    Body mass index is 25.35 kg/(m^2). Patient meets criteria for overweight based on current BMI.   Current diet order is heart healthy, patient is consuming approximately 100% of meals at this time. Labs and medications reviewed.   No nutrition interventions warranted at this time. If nutrition issues arise, please consult RD.   Molli Barrows, RD, LDN, [place] Pager [phone] After Hours Pager [phone]

## 2013-10-03 NOTE — Progress Notes (Signed)
RT arrived to floor to give scheduled treatment. Checked on pt at this time and pt was off CPAP. Spoke with RN and RN stated pt felt like he couldn't tolerate CPAP and was going to see if family could bring his home machine in. RT unaware of what time pt was taken off. RT will continue to monitor.

## 2013-10-03 NOTE — Plan of Care (Signed)
Problem: Phase I Progression Outcomes Goal: Hemodynamically stable Outcome: Progressing Patient admitted to floor from [facility] ED for further management of decreased BP and HR.  Patient is alert and oriented x4, in no apparent distress.  Placed on continuous telemetry and oriented to room and floor procedures.  NS at 50 ml/hr.  Resting quietly at present.  Will continue to monitor.

## 2013-10-04 ENCOUNTER — Encounter (HOSPITAL_COMMUNITY): Payer: Self-pay | Admitting: Cardiology

## 2013-10-04 DIAGNOSIS — E43 Unspecified severe protein-calorie malnutrition: Secondary | ICD-10-CM

## 2013-10-04 DIAGNOSIS — I509 Heart failure, unspecified: Secondary | ICD-10-CM

## 2013-10-04 DIAGNOSIS — N183 Chronic kidney disease, stage 3 unspecified: Secondary | ICD-10-CM | POA: Diagnosis present

## 2013-10-04 DIAGNOSIS — I498 Other specified cardiac arrhythmias: Secondary | ICD-10-CM | POA: Diagnosis not present

## 2013-10-04 DIAGNOSIS — I2510 Atherosclerotic heart disease of native coronary artery without angina pectoris: Secondary | ICD-10-CM

## 2013-10-04 DIAGNOSIS — I10 Essential (primary) hypertension: Secondary | ICD-10-CM

## 2013-10-04 DIAGNOSIS — G4730 Sleep apnea, unspecified: Secondary | ICD-10-CM

## 2013-10-04 DIAGNOSIS — G4733 Obstructive sleep apnea (adult) (pediatric): Secondary | ICD-10-CM | POA: Diagnosis present

## 2013-10-04 DIAGNOSIS — N184 Chronic kidney disease, stage 4 (severe): Secondary | ICD-10-CM

## 2013-10-04 DIAGNOSIS — I5033 Acute on chronic diastolic (congestive) heart failure: Secondary | ICD-10-CM

## 2013-10-04 DIAGNOSIS — Z9989 Dependence on other enabling machines and devices: Secondary | ICD-10-CM

## 2013-10-04 HISTORY — DX: Chronic kidney disease, stage 4 (severe): N18.4

## 2013-10-04 HISTORY — DX: Obstructive sleep apnea (adult) (pediatric): G47.33

## 2013-10-04 LAB — BASIC METABOLIC PANEL
BUN: 55 mg/dL — ABNORMAL HIGH (ref 6–23)
CO2: 25 meq/L (ref 19–32)
Calcium: 9.3 mg/dL (ref 8.4–10.5)
Chloride: 103 mEq/L (ref 96–112)
Creatinine, Ser: 2.33 mg/dL — ABNORMAL HIGH (ref 0.50–1.35)
GFR calc non Af Amer: 24 mL/min — ABNORMAL LOW (ref >90)
GFR, EST AFRICAN AMERICAN: 28 mL/min — AB (ref >90)
Glucose, Bld: 94 mg/dL (ref 70–99)
POTASSIUM: 4.4 meq/L (ref 3.7–5.3)
Sodium: 143 mEq/L (ref 137–147)

## 2013-10-04 MED ORDER — POTASSIUM CHLORIDE ER 10 MEQ PO TBCR: 10.0000 meq | EXTENDED_RELEASE_TABLET | Freq: Every day | ORAL | Status: DC

## 2013-10-04 MED ORDER — FUROSEMIDE 40 MG PO TABS: 40.0000 mg | ORAL_TABLET | Freq: Every day | ORAL | Status: DC

## 2013-10-04 MED ORDER — DSS 100 MG PO CAPS: 100.0000 mg | ORAL_CAPSULE | Freq: Two times a day (BID) | ORAL | Status: DC

## 2013-10-04 MED ORDER — DOCUSATE SODIUM 100 MG PO CAPS
100.0000 mg | ORAL_CAPSULE | Freq: Two times a day (BID) | ORAL | Status: DC
  Filled 2013-10-04 (×2): qty 1

## 2013-10-04 NOTE — Progress Notes (Signed)
Pt. Seen and examined. Agree with the NP/PA-C note as written.  HR is slow, but he is hemodynamically stable. Seems to be tolerating it well. He is -2.2L negative. Weight down to 179 from 181. Restart lasix tomorrow at home - but at 40 mg once daily. Creatinine is down to 2.33 which is about baseline. Hold additional b-blocker. Add stool softener. Okay to d/c home today. Follow-up with Dr. Aundra Dubin as an outpatient.  Pixie Casino, MD, [facility] Attending Cardiologist [place]

## 2013-10-04 NOTE — Progress Notes (Signed)
Subjective: No chest pain, no SOB, no BM in 2 days  Objective: Vital signs in last 24 hours: Temp:  [98 F (36.7 C)-98.5 F (36.9 C)] 98.1 F (36.7 C) (06/07 0550) Pulse Rate:  [52-63] 52 (06/07 0550) Resp:  [16-18] 16 (06/07 0550) BP: (123-164)/(46-67) 131/47 mmHg (06/07 0550) SpO2:  [94 %-100 %] 98 % (06/07 0550) Weight:  [179 lb 8 oz (81.421 kg)] 179 lb 8 oz (81.421 kg) (06/07 0550) Weight change: -3 lb 3.2 oz (-1.452 kg) Last BM Date: 10/01/13 Intake/Output from previous day:-1459  (total since admit -2206)  Wt 179 down from 182.11 on admit 06/06 0701 - 06/07 0700 In: 1040.8 [P.O.:960; I.V.:80.8] Out: 2500 [Urine:2500] Intake/Output this shift:    PE: General:Pleasant affect, NAD Skin:Warm and dry, brisk capillary refill HEENT:normocephalic, sclera clear, mucus membranes moist Neck:supple, no JVD Heart:S1S2 RRR without murmur, gallup, rub or click Lungs:clear without rales, rhonchi, or wheezes KCM:KLKJ, non tender, + BS, do not palpate liver spleen or masses Ext:no lower ext edema, 2+ pedal pulses, 2+ radial pulses Neuro:alert and oriented, MAE, follows commands, + facial symmetry   Tele:  Glade Stanford mostly in the 40s  Lab Results:  Recent Labs  10/02/13 1725 10/02/13 2227  WBC 9.0 8.0  HGB 11.6* 11.6*  HCT 35.3* 35.2*  PLT 176 163   BMET  Recent Labs  10/03/13 0414 10/04/13 0422  NA 139 143  K 3.8 4.4  CL 99 103  CO2 25 25  GLUCOSE 93 94  BUN 66* 55*  CREATININE 3.00* 2.33*  CALCIUM 9.0 9.3   No results found for this basename: TROPONINI, CK, MB,  in the last 72 hours  Lab Results  Component Value Date   CHOL 124 07/13/2013   HDL 36.90* 07/13/2013   LDLCALC 67 07/13/2013   LDLDIRECT 99.1 10/03/2007   TRIG 99.0 07/13/2013   CHOLHDL 3 07/13/2013   No results found for this basename: HGBA1C     Lab Results  Component Value Date   TSH 2.770 09/26/2013    Hepatic Function Panel  Recent Labs  10/03/13 0414  PROT 6.2  ALBUMIN 3.2*   AST 11  ALT 6  ALKPHOS 60  BILITOT 0.7   BNP (last 3 results)  Recent Labs  09/25/13 1618 10/02/13 1725  PROBNP 3044.0* 1868.0*     Studies/Results: Dg Chest Port 1 View  10/02/2013   CLINICAL DATA:  Bradycardia.  EXAM: PORTABLE CHEST - 1 VIEW  COMPARISON:  09/25/2013  FINDINGS: Sternotomy wires are unchanged. Lungs are adequately inflated without focal consolidation or effusion. There is mild stable cardiomegaly. Remainder the exam is unchanged.  IMPRESSION: No acute cardiopulmonary disease.  Mild stable cardiomegaly.   Electronically Signed   By: Marin Olp M.D.   On: 10/02/2013 18:02    Medications: I have reviewed the patient's current medications. Scheduled Meds: . aspirin EC  81 mg Oral Daily  . buPROPion  150 mg Oral BID  . fluticasone  2 spray Each Nare Daily  . heparin  5,000 Units Subcutaneous 3 times per day  . simvastatin  20 mg Oral q1800   Continuous Infusions:  PRN Meds:.acetaminophen, ALPRAZolam, nitroGLYCERIN  Assessment/Plan: Principal Problem:   Bradycardia- continues to be in the 40s, ambulate, pt lives alone. Active Problems:   HYPERLIPIDEMIA   HYPERTENSION episodic   DIASTOLIC HEART FAILURE, CHRONIC   CAD (coronary artery disease)   Acute on chronic diastolic CHF (congestive heart failure)- pro BNP improved  AKI (acute kidney injury)- slowly improving   Hypotension  Ambulate, add stool softener for mild constipation.   LOS: 2 days   Time spent with pt. : 15 minutes. Cecilie Kicks  Nurse Practitioner Certified Pager [phone] or after 5pm and on weekends call [phone] 10/04/2013, 7:18 AM

## 2013-10-04 NOTE — Discharge Summary (Addendum)
Physician Discharge Summary       Patient ID: LAZLO TUNNEY MRN: [id number] DOB/AGE: 1928/09/06 80 y.o.  Admit date: 10/02/2013 Discharge date: 10/04/2013  Discharge Diagnoses:  Principal Problem:   Bradycardia, improved with d/c of coreg Active Problems:   Acute on chronic diastolic CHF (congestive heart failure)   AKI (acute kidney injury), back to baseline at discharge   [place], CHRONIC   PERIPHERAL VASCULAR DISEASE   CAD (coronary artery disease)   Hypotension   CKD (chronic kidney disease) stage 4, GFR 15-29 ml/min   OSA on CPAP   Discharged Condition: good  Primary Cardiologist: Dr. Einar Crow  Procedures: none  Hospital Course: 80 y/o male with h/o CAD and diast CHF s/p recent admission for volume overload, bradycardia, and hypotn who presented back to the ED 10/02/13 2/2 ongoing hypotension and bradycardia.  He was recently admitted to [facility] 09/25/13 with hypotension and bradycardia along with mild volume overload and acute on chronic renal failure. He was treated with IV lasix x 1 and then PO lasix was resumed. His BB was reduced to 12.5mg  bid while home doses of amlodipine and losartan were discontinued. Echo showed nl LV fxn with diast dysfxn. During that admission, he was noted to have a slow healing ulcer on the medial left lower leg. ABI's were done and were reduced with R of 0.56 and L of 0.60. He was discharged home on 6/1 with plan for outpt PV f/u. Since d/c, his BP and HR have continued to trend low (90's and 40's respectively). There are several phone notes recording this. Unfortunately, he never stopped taking his losartan on amlodipine. His caretaker did not see that he was supposed to stop taking these meds on the d/c AVS and he has his meds filled weekly at his pharmacy, so he just went home and continued to take what was in his prefilled weekly med box. On admit 10/02/13, he has been feeling more weak. BP's have  been running in the 70's to 90's with HR's pretty steadily in the 40's. His caretaker called EMS to have him taken back to the ED. Here, HR/BP stable. He currently offers no complaints r/t to either. He has not been having c/p or dyspnea and his wt has been stable @ home. He is depressed as his wife, who has dementia, traveled to [place] recently to visit her dtr and her dtr apparently has kept her down there and is looking to place her in ALF.     Pt was admitted and hydrated and his amlodipine and losartan were stopped.  He had no chest pain.  His Cr. Has improved and HR is improving.  Asymptomatic bradycardia.  Will resume lasix at 40 mg daily and K+ at 10 meq daily.  He will ambulate in hall prior to discharge.  We have stopped the coreg as well.   Pt ambulates in room with some help, he does have a caregiver at home.    I called [facility] about his med changes.  He was seen and evaluated By Dr. Debara Pickett and found stable for discharge.  He has follow up appts. This week with Dr. Gwenlyn Found for PVD.    Consults: None  Significant Diagnostic Studies:  BMET    Component Value Date/Time   NA 143 10/04/2013 0422   K 4.4 10/04/2013 0422   CL 103 10/04/2013 0422   CO2 25 10/04/2013 0422   GLUCOSE 94 10/04/2013 0422  GLUCOSE 105* 05/13/2006 1610   BUN 55* 10/04/2013 0422   CREATININE 2.33* 10/04/2013 0422   CALCIUM 9.3 10/04/2013 0422   GFRNONAA 24* 10/04/2013 0422   GFRAA 28* 10/04/2013 0422    CBC    Component Value Date/Time   WBC 8.0 10/02/2013 2227   RBC 4.07* 10/02/2013 2227   HGB 11.6* 10/02/2013 2227   HCT 35.2* 10/02/2013 2227   PLT 163 10/02/2013 2227   MCV 86.5 10/02/2013 2227   MCH 28.5 10/02/2013 2227   MCHC 33.0 10/02/2013 2227   RDW 16.1* 10/02/2013 2227   LYMPHSABS 1.2 10/02/2013 1725   MONOABS 1.2* 10/02/2013 1725   EOSABS 0.1 10/02/2013 1725   BASOSABS 0.0 10/02/2013 1725   BNP (last 3 results)  Recent Labs  09/25/13 1618 10/02/13 1725  PROBNP 3044.0* 1868.0*   PORTABLE CHEST - 1  VIEW COMPARISON: 09/25/2013 FINDINGS: Sternotomy wires are unchanged. Lungs are adequately inflated without focal consolidation or effusion. There is mild stable cardiomegaly. Remainder the exam is unchanged. IMPRESSION: No acute cardiopulmonary disease. Mild stable cardiomegaly     Discharge Exam: Blood pressure 131/47, pulse 52, temperature 98.1 F (36.7 C), temperature source Oral, resp. rate 16, height 5\' 11"  (1.803 m), weight 179 lb 8 oz (81.421 kg), SpO2 98.00%.   Disposition: 01-Home or Self Care     Medication List    STOP taking these medications       carvedilol 12.5 MG tablet  Commonly known as:  COREG      TAKE these medications       ALPRAZolam 0.5 MG tablet  Commonly known as:  XANAX  Take 0.5 mg by mouth 2 (two) times daily as needed for anxiety.     aspirin 81 MG EC tablet  Take 1 tablet (81 mg total) by mouth daily.     buPROPion 150 MG 24 hr tablet  Commonly known as:  WELLBUTRIN XL  Take 150 mg by mouth 2 (two) times daily.     DSS 100 MG Caps  Take 100 mg by mouth 2 (two) times daily.     fluticasone 50 MCG/ACT nasal spray  Commonly known as:  FLONASE  Place 2 sprays into both nostrils daily.     furosemide 40 MG tablet  Commonly known as:  LASIX  Take 1 tablet (40 mg total) by mouth daily.     isosorbide mononitrate 30 MG 24 hr tablet  Commonly known as:  IMDUR  Take 1 tablet (30 mg total) by mouth daily.     nitroGLYCERIN 0.4 MG SL tablet  Commonly known as:  NITROSTAT  Place 0.4 mg under the tongue every 5 (five) minutes as needed for chest pain.     potassium chloride 10 MEQ tablet  Commonly known as:  K-DUR  Take 1 tablet (10 mEq total) by mouth daily.     simvastatin 20 MG tablet  Commonly known as:  ZOCOR  Take 20 mg by mouth at bedtime.       Follow-up Information   Follow up with Loralie Champagne, MD. (keep appt with Richardson Dopp on the 19th, also keep appts with Dr. Kennon Holter office.)    Specialty:  Cardiology   Contact  information:   [address] [place] [address] [phone]        Discharge Instructions: Call if you are lightheaded or dizzy, or with increasing weakness.    Weigh daily Call [phone] if weight climbs more than 3 pounds in a day or  5 pounds in a week. No salt to very little salt in your diet.  No more than 2000 mg in a day. Call if increased shortness of breath or increased swelling.   we have stopped your coreg.  This was making your Heart Rate slow.  We have also decreased your lasix (furosemide)  And Potassium.  These may need to be adjusted again on your next visit.   Heart Healthy low salt diet   We added a stool softener if you need it -- it is over the counter at pharmacy Keep appointments with Dr. Gwenlyn Found for ultrasound and his appt.  Then keep appt with Richardson Dopp on the 19th.  Do not take Coreg, losartan or amlodipine.    I left a message at [facility] that the above meds were stopped and that Lasix and potassium doses were adjusted.  Signed: Cecilie Kicks Nurse Practitioner-Certified [facility]: [facility] 10/04/2013, 8:57 AM  Time spent on discharge : >35 minutes.

## 2013-10-04 NOTE — Care Management Note (Signed)
    Page 1 of 2   10/04/2013     4:08:34 PM CARE MANAGEMENT NOTE 10/04/2013  Patient:  SUBHAN, HOOPES   Account Number:  [id number]  Date Initiated:  10/04/2013  Documentation initiated by:  [facility]  Subjective/Objective Assessment:   adm: ongoing hypotn and bradycardia; Bradycardia, improved with d/c of coreg     Action/Plan:   discharge planning   Anticipated DC Date:  10/04/2013   Anticipated DC Plan:  [place]  CM consult      [facility] Choice  HOME HEALTH   Choice offered to / List presented to:  C-1 Patient   DME arranged  3-N-1  Vassie Moselle      DME agency  [place] arranged  [place].   Status of service:  Completed, signed off Medicare Important Message given?   (If response is "NO", the following Medicare IM given date fields will be blank) Date Medicare IM given:   Date Additional Medicare IM given:    Discharge Disposition:  [place]  Per UR Regulation:    If discussed at Long Length of Stay Meetings, dates discussed:    Comments:  10/04/13 14:00 CM met with pt and pt's son in room.  Pt states he has 24 hour care and son confirms.  Pt chooses [facility] to render HHPT/OT.  Address and contact information verified with pt.  Referral called to [facility] rep, [place].  RW and 3n1 to be brought to room prior to discharge by DME delivery rep.  No other CM needs were communicated.  Mariane Masters, BSN, CM [phone].

## 2013-10-04 NOTE — Evaluation (Signed)
Physical Therapy Evaluation Patient Details Name: Harold [name] MRN: [id number] DOB: 1928-11-10 Today's Date: 10/04/2013   History of Present Illness  80 y/o male with h/o CAD and diast CHF s/p recent admission for volume overload, bradycardia, and hypotn who presents back to the ED today 2/2 ongoing hypotn and bradycardia.  Clinical Impression  Patient evaluated by Physical Therapy with no further acute PT needs identified. All education has been completed and the patient has no further questions.  See below for any follow-up Physical Therapy or equipment needs. PT is signing off. Thank you for this referral.  PT states he has hired help who assists him with all his needs, and drives him for errands; He also indicated she stays at the house and sleeps there --   Pt asking very appropriate questions re: managing at home, especially ADLs; I feel that these questions can be addressed by [facility] therapies; Noted somewhat dependent on UE support in standing; also noted slower gait indicative of fall risk; this definitely justifies need for HHPT/OT for balance and safety with mobility and ADLs     Follow Up Recommendations Home health PT;Supervision for mobility/OOB;Other (comment) (HHOT/Aide/RN)    Equipment Recommendations  Rolling walker with 5" wheels;3in1 (PT) (Spoke with Case Mgr)    Recommendations for Other Services Other (comment) (PT is asking about having a psyciatry referral; he voiced sadness that his wife is in [place]; I referred him back to his primary care physician to obtain a referral to a psychiatrist)     Precautions / Restrictions Precautions Precautions: Fall Precaution Comments: Fall risk reduced with amb using RW      Mobility  Bed Mobility Overal bed mobility: Modified Independent                Transfers Overall transfer level: Needs assistance Equipment used: Rolling walker (2 wheeled) Transfers: Sit to/from Stand Sit to Stand: Mod assist         General transfer comment: Light mod antigravity assist to come to stand; dependent on momentum; noted good control of descent to sit with cueing  Ambulation/Gait Ambulation/Gait assistance: Min guard;Supervision Ambulation Distance (Feet): 120 Feet Assistive device: Rolling walker (2 wheeled) Gait Pattern/deviations: Step-through pattern;Trunk flexed Gait velocity: slowed   General Gait Details: Minguard assist progressing to supervision; Used RW relatively well with occasional cues for RW proximity; Cues also to self-monitor for activity tolerance  Stairs            Wheelchair Mobility    Modified Rankin (Stroke Patients Only)       Balance Overall balance assessment: Needs assistance   Sitting balance-Leahy Scale: Good     Standing balance support: Single extremity supported;Bilateral upper extremity supported Standing balance-Leahy Scale: Fair Standing balance comment: Noted somewhat dependent on UE support in standing; also noted slower gait indicative of fall risk; this definitely justifies need for HHPT/OT for balance and safety with mobility and ADLs                             Pertinent Vitals/Pain VSS HR 68 post amb O2 sat 99-100% post amb BP 148/68 post amb    Home Living Family/patient expects to be discharged to:: Private residence Living Arrangements: Alone;Non-relatives/Friends Available Help at Discharge: Friend(s);Available PRN/intermittently (Pt describes a friend who helps him near 24 hours) Type of Home: House Home Access: Stairs to enter Entrance Stairs-Rails: None Entrance Stairs-Number of Steps: 1 Home Layout: One level Home Equipment:  Other (comment) Risk manager; pt states is old and he's not sure if it is a RW)      Prior Function Level of Independence: Needs assistance   Gait / Transfers Assistance Needed: with assistive device; Having trouble getting to stand recently  ADL's / Homemaking Assistance Needed: Reports has  someone helping hime near 24 hour who helps with home manageement        Hand Dominance        Extremity/Trunk Assessment   Upper Extremity Assessment: Overall WFL for tasks assessed           Lower Extremity Assessment: Generalized weakness (with noted R knee sore and a bit swollen; pt states is chron)         Communication   Communication: No difficulties  Cognition Arousal/Alertness: Awake/alert Behavior During Therapy: WFL for tasks assessed/performed Overall Cognitive Status: Within Functional Limits for tasks assessed                      General Comments      Exercises        Assessment/Plan    PT Assessment All further PT needs can be met in the next venue of care  PT Diagnosis Difficulty walking;Generalized weakness   PT Problem List Decreased strength;Decreased activity tolerance;Decreased balance;Decreased mobility;Decreased coordination;Decreased knowledge of use of DME;Cardiopulmonary status limiting activity  PT Treatment Interventions     PT Goals (Current goals can be found in the Care Plan section) Acute Rehab PT Goals Patient Stated Goal: REALLY wants to go home PT Goal Formulation: No goals set, d/c therapy (DC acute PT)    Frequency     Barriers to discharge        Co-evaluation               End of Session Equipment Utilized During Treatment: Gait belt Activity Tolerance: Patient tolerated treatment well Patient left: in bed;with call bell/phone within reach;with family/visitor present Nurse Communication: Mobility status    Functional Assessment Tool Used: Clinical Judgement Functional Limitation: Mobility: Walking and moving around Mobility: Walking and Moving Around Current Status (T0626): At least 20 percent but less than 40 percent impaired, limited or restricted Mobility: Walking and Moving Around Goal Status [phone]): 0 percent impaired, limited or restricted Mobility: Walking and Moving Around Discharge  Status [phone]): At least 20 percent but less than 40 percent impaired, limited or restricted    Time: 0950-1015 PT Time Calculation (min): 25 min   Charges:   PT Evaluation $Initial PT Evaluation Tier I: 1 Procedure PT Treatments $Gait Training: 8-22 mins   PT G Codes:   Functional Assessment Tool Used: Clinical Judgement Functional Limitation: Mobility: Walking and moving around    [facility] 10/04/2013, 10:41 AM Roney [place], PT  Acute Rehabilitation Services Pager [phone] Office [phone]

## 2013-10-04 NOTE — Discharge Instructions (Signed)
Call if you are lightheaded or dizzy, or with increasing weakness.    Weigh daily Call [phone] if weight climbs more than 3 pounds in a day or 5 pounds in a week. No salt to very little salt in your diet.  No more than 2000 mg in a day. Call if increased shortness of breath or increased swelling.   we have stopped your coreg.  This was making your Heart Rate slow.  We have also decreased your lasix (furosemide)  And Potassium.  These may need to be adjusted again on your next visit.   Heart Healthy low salt diet  We added a stool softener if you need it -- it is over the counter at pharmacy  Keep appointments with Dr. Gwenlyn Found for ultrasound and his appt.  Then keep appt with Richardson Dopp on the 19th.  Do not take Coreg, losartan or amlodipine.    I left a message at [facility] that the above meds were stopped and that Lasix and potassium doses were adjusted.

## 2013-10-05 ENCOUNTER — Telehealth: Payer: Self-pay | Admitting: Internal Medicine

## 2013-10-05 NOTE — Telephone Encounter (Signed)
[name] would like cindy to return her call today concerning this patient.

## 2013-10-05 NOTE — Telephone Encounter (Signed)
S/w pt to let him know that he need to make an appt with [facility] or [facility] before he can get another refill . Pt is having PT at this time and will c/b to schedule

## 2013-10-05 NOTE — Telephone Encounter (Signed)
Left message for Raina Mina to call back

## 2013-10-07 ENCOUNTER — Inpatient Hospital Stay (HOSPITAL_COMMUNITY): Admission: RE | Admit: 2013-10-07 | Payer: PRIVATE HEALTH INSURANCE | Source: Ambulatory Visit

## 2013-10-07 ENCOUNTER — Encounter (HOSPITAL_COMMUNITY): Payer: PRIVATE HEALTH INSURANCE

## 2013-10-08 ENCOUNTER — Ambulatory Visit: Payer: PRIVATE HEALTH INSURANCE | Admitting: Cardiovascular Disease

## 2013-10-09 NOTE — Telephone Encounter (Signed)
Tried to call [name] again, no answer, no voicemail

## 2013-10-12 NOTE — Telephone Encounter (Signed)
[name], RN with [facility] Pt was taking lasix 40 mg bid, coreg 12.5mg  1 tab bid.  Bps 80/40, 90/40.  Pt has been in the hospital since then.  BP now 126/68 60 hr.  He has d/c the coreg and potassium is 10 meq once daily and now lasix 40 mg once daily.  This is current from Friday 10/09/13 per home health aid.  Social worker is coming in for anger management.  They are trying to get him with [facility] and they are trying to get him in Assisted Living. FYI sent to Dr Leanne Chang

## 2013-10-16 ENCOUNTER — Telehealth: Payer: Self-pay | Admitting: Internal Medicine

## 2013-10-16 ENCOUNTER — Ambulatory Visit (INDEPENDENT_AMBULATORY_CARE_PROVIDER_SITE_OTHER): Payer: Medicare Other | Admitting: Physician Assistant

## 2013-10-16 ENCOUNTER — Encounter: Payer: Self-pay | Admitting: Physician Assistant

## 2013-10-16 ENCOUNTER — Telehealth: Payer: Self-pay | Admitting: *Deleted

## 2013-10-16 ENCOUNTER — Other Ambulatory Visit: Payer: Self-pay | Admitting: Internal Medicine

## 2013-10-16 VITALS — BP 150/79 | HR 70 | Ht 71.0 in | Wt 186.0 lb

## 2013-10-16 DIAGNOSIS — I10 Essential (primary) hypertension: Secondary | ICD-10-CM

## 2013-10-16 DIAGNOSIS — I2510 Atherosclerotic heart disease of native coronary artery without angina pectoris: Secondary | ICD-10-CM

## 2013-10-16 DIAGNOSIS — I714 Abdominal aortic aneurysm, without rupture, unspecified: Secondary | ICD-10-CM

## 2013-10-16 DIAGNOSIS — I25810 Atherosclerosis of coronary artery bypass graft(s) without angina pectoris: Secondary | ICD-10-CM

## 2013-10-16 DIAGNOSIS — E785 Hyperlipidemia, unspecified: Secondary | ICD-10-CM

## 2013-10-16 DIAGNOSIS — I739 Peripheral vascular disease, unspecified: Secondary | ICD-10-CM

## 2013-10-16 DIAGNOSIS — M25529 Pain in unspecified elbow: Secondary | ICD-10-CM

## 2013-10-16 DIAGNOSIS — N184 Chronic kidney disease, stage 4 (severe): Secondary | ICD-10-CM

## 2013-10-16 DIAGNOSIS — I5032 Chronic diastolic (congestive) heart failure: Secondary | ICD-10-CM

## 2013-10-16 LAB — BASIC METABOLIC PANEL
BUN: 23 mg/dL (ref 6–23)
CO2: 29 mEq/L (ref 19–32)
CREATININE: 2 mg/dL — AB (ref 0.4–1.5)
Calcium: 9.4 mg/dL (ref 8.4–10.5)
Chloride: 103 mEq/L (ref 96–112)
GFR: 34.53 mL/min — AB (ref >60.00)
GLUCOSE: 84 mg/dL (ref 70–99)
Potassium: 4.2 mEq/L (ref 3.5–5.1)
SODIUM: 139 meq/L (ref 135–145)

## 2013-10-16 NOTE — Telephone Encounter (Signed)
pt notified about lab results with verbal understanding  

## 2013-10-16 NOTE — Telephone Encounter (Signed)
Pt is requesting a referral to a orthopedic doctor regarding bones sticking out at his elbows. Pt would like to speak with the nurse states he has questions.

## 2013-10-16 NOTE — Patient Instructions (Signed)
LAB WORK TODAY; BMET  Your physician recommends that you schedule a follow-up appointment on 12/07/13 @ 2 PM   NO CHANGES WERE MADE TODAY WITH MEDICATIONS

## 2013-10-16 NOTE — Progress Notes (Signed)
Cardiology Office Note    Date:  10/16/2013   ID:  Harold [name], DOB 10/22/1928, MRN [id number]  PCP:  Harold Hurter, MD  Cardiologist:  Dr. Loralie [name]      History of Present Illness: Harold [name] is a 80 y.o. male with a hx of CAD, status post CABG in 1998 and multiple PCI's, diastolic CHF, macular degeneration (legally blind), sleep apnea, prior AAA repair, HTN, CKD.  He was admitted 5/29-6/1 for acute on chronic diastolic CHF. This was complicated by AKI. He was diuresed with IV Lasix. Beta blocker dosage was decreased secondary to bradycardia. ARB was held as his blood pressure was somewhat labile. Echo demonstrated normal LVF.  He had a nonhealing ulcer on the left lower extremity at the ankle. ABIs were abnormal (right 0.56, left 0.60). Patient will follow up with Dr. Gwenlyn [name] as an outpatient. He was readmitted 6/5-6/7 with hypotension and bradycardia. He had not stopped taking his Amlodipine or Losartan.  He was hydrated. Amlodipine and losartan were stopped. Carvedilol was also stopped given bradycardia. He returns for follow up.  He tells me that his LLE ulcer is healed.  He is breathing much better.  He is NYHA 2-2b.  Denies orthopnea, PND.  LE edema is stable.  He denies chest pain.  Denies syncope. No significant cough.  His weights have been going down.  He is not eating well.  His wife has dementia and is staying with her daughter in [place].  He is worried about her.     Studies:  - LHC (09/06/08):  Mid left main 70%, LAD occluded, circumflex occluded, RCA occluded, LIMA-LAD patent with 40% within the stent at the anastomosis, SVG-diagonal occluded, SVG-circumflex occluded, SVG-RCA occluded - medical therapy  - Echo (09/26/13):  Mild LVH, EF 50-53%, grade 2 diastolic dysfunction, moderate MR  - ABI (09/27/13):  R 0.56; L 0.6  Recent Labs: 07/13/2013: HDL Cholesterol by NMR 36.90*; LDL (calc) 67  09/26/2013: TSH 2.770  10/02/2013: Hemoglobin 11.6*; Pro B  Natriuretic peptide (BNP) 1868.0*  10/03/2013: ALT 6  10/04/2013: Creatinine 2.33*; Potassium 4.4   Wt Readings from Last 3 Encounters:  10/16/13 186 lb (84.369 kg)  10/04/13 179 lb 8 oz (81.421 kg)  09/28/13 181 lb 8 oz (82.328 kg)     Past Medical History:  1. CAD: CABG in 1998. Last cath in 2010 showed all native vessels occluded and all SVGs occluded, LIMA-LAD was patent with good collaterals to other circulations. No interventional options and was not a good candidate for redo surgery.  2. Macular degeneration.  3. Gout  4. AAA s/p repair  5. PAD 6. OSA: CPAP  7. Diastolic CHF: Echo (9/76) with EF 55%, inferior hypokinesis, grade III diastolic dysfunction (restrictive), no significant valvular dysfunction. Echo (2/13) with EF 55%, moderate LVH, moderate diastolic dysfunciton, normal RV, mild MR.  8. CKD   Past Medical History  Diagnosis Date  . CAD (coronary artery disease)     a. no known MImultiple PCIs;  b. S/P CABG;  c. all vein grafts occluded, LIMA patent by cath 08/2008.  Marland Kitchen Gout   . Macular degeneration     followed at [facility]  . PVD (peripheral vascular disease)   . OSA (obstructive sleep apnea)     using CPAP  . Diastolic heart failure     a. 08/2013 Echo: EF 60-65%, mild LVH, Gr 2 DD, mod MR.  Marland Kitchen Hypertension   . Hyperlipidemia   . Depression  symptoms  . CKD (chronic kidney disease), stage IV   . Renal adenoma     hx  . Nephrolithiasis     hx   . Hx of skin cancer, basal cell   . AAA (abdominal aortic aneurysm) 2002    3.2 cm, with repair  . History of tobacco use     Remote (same for alcohol use)  . Claudication     a. 08/2013 ABI's R: 0.56, L 0.60 - f/u with Dr. Gwenlyn [name] scheduled.  . Bradycardia     a. 08/2013  . Hypotension     a. 08/2013  . CKD (chronic kidney disease) stage 4, GFR 15-29 ml/min 10/04/2013  . OSA on CPAP 10/04/2013    Current Outpatient Prescriptions  Medication Sig Dispense Refill  . ALPRAZolam (XANAX) 0.5 MG tablet Take 0.5 mg by  mouth 2 (two) times daily as needed for anxiety.      Marland Kitchen aspirin EC 81 MG EC tablet Take 1 tablet (81 mg total) by mouth daily.      Marland Kitchen buPROPion (WELLBUTRIN XL) 150 MG 24 hr tablet Take 150 mg by mouth 2 (two) times daily.      Marland Kitchen docusate sodium 100 MG CAPS Take 100 mg by mouth 2 (two) times daily.  30 capsule  0  . fluticasone (FLONASE) 50 MCG/ACT nasal spray Place 2 sprays into both nostrils daily.  16 g  2  . furosemide (LASIX) 40 MG tablet Take 1 tablet (40 mg total) by mouth daily.  60 tablet  5  . isosorbide mononitrate (IMDUR) 30 MG 24 hr tablet Take 1 tablet (30 mg total) by mouth daily.  30 tablet  3  . nitroGLYCERIN (NITROSTAT) 0.4 MG SL tablet Place 0.4 mg under the tongue every 5 (five) minutes as needed for chest pain.      . polyethylene glycol powder (GLYCOLAX/MIRALAX) powder       . potassium chloride (K-DUR) 10 MEQ tablet Take 1 tablet (10 mEq total) by mouth daily.      . simvastatin (ZOCOR) 20 MG tablet Take 20 mg by mouth at bedtime.        No current facility-administered medications for this visit.    Allergies:   Review of patient's allergies indicates no known allergies.   Social History:  The patient  reports that he quit smoking about 25 years ago. He does not have any smokeless tobacco history on file. He reports that he drinks about 1.2 ounces of alcohol per week. He reports that he does not use illicit drugs.   Family History:  The patient's family history includes Emphysema in his father; Heart disease in his paternal uncle.   ROS:  Please see the history of present illness.      All other systems reviewed and negative.   PHYSICAL EXAM: VS:  BP 150/79  Pulse 70  Ht 5\' 11"  (1.803 m)  Wt 186 lb (84.369 kg)  BMI 25.95 kg/m2 Well nourished, well developed, in no acute distress HEENT: normal Neck: no JVD Cardiac:  normal S1, S2; RRR; no murmur Lungs:  clear to auscultation bilaterally, no wheezing, rhonchi or rales Abd: soft, nontender, no hepatomegaly Ext:  trace to 1+ bilateral LE edema Skin: warm and dry Neuro:  CNs 2-12 intact, no focal abnormalities noted  EKG:  NSR, HR 70, rightward axis, inf-lat TWI     ASSESSMENT AND PLAN:  1. Chronic Diastolic CHF:  Volume appears stable. Check followup basic metabolic panel today. Continue current therapy.  He knows to contact us if his weight increases 3 pounds or more in one day. 2. Coronary Artery Disease s/p CABG (all SVGs known to be occluded):  No angina. Continue aspirin, statin. 3. Essential hypertension:  Blood pressure somewhat elevated today. He tells me that the home health nurse has been taking his blood pressure since discharge. His blood pressures have been optimal. Continue to monitor for now. 4. CKD (chronic kidney disease) stage 4, GFR 15-29 ml/min:  Check followup basic metabolic panel today. 5. HYPERLIPIDEMIA:  Continue statin. 6. PAD (peripheral artery disease):  The wound on his left ankle appears to be healing. Followup with PV as planned. 7. A A A s/p Repair  8. Disposition: Follow up with me or Dr. Aundra Dubin in 6-8 weeks.   Signed, Versie Starks, MHS 10/16/2013 12:35 PM    [facility] [address], [place], [place]  [address] Phone: [phone]; Fax: [phone]

## 2013-10-17 ENCOUNTER — Telehealth: Payer: Self-pay | Admitting: Internal Medicine

## 2013-10-17 NOTE — Telephone Encounter (Signed)
Relevant patient education mailed to patient.  

## 2013-10-22 ENCOUNTER — Encounter (HOSPITAL_COMMUNITY): Payer: Medicare Other

## 2013-10-22 NOTE — Telephone Encounter (Signed)
Referral order placed.

## 2013-10-27 ENCOUNTER — Ambulatory Visit (HOSPITAL_COMMUNITY)
Admission: RE | Admit: 2013-10-27 | Discharge: 2013-10-27 | Disposition: A | Discharge status: Home / Self Care | Payer: Medicare Other | Source: Ambulatory Visit | Attending: Cardiovascular Disease | Admitting: Cardiovascular Disease

## 2013-10-27 DIAGNOSIS — I739 Peripheral vascular disease, unspecified: Secondary | ICD-10-CM | POA: Insufficient documentation

## 2013-10-27 DIAGNOSIS — I70219 Atherosclerosis of native arteries of extremities with intermittent claudication, unspecified extremity: Secondary | ICD-10-CM

## 2013-10-27 NOTE — Progress Notes (Signed)
Arterial Lower Ext. Duplex Completed. Rita Sturdivant, BS, RDMS, RVT  

## 2013-10-30 ENCOUNTER — Other Ambulatory Visit: Payer: Self-pay | Admitting: Internal Medicine

## 2013-11-10 ENCOUNTER — Ambulatory Visit: Payer: Medicare Other | Admitting: Physician Assistant

## 2013-11-10 DIAGNOSIS — Z0289 Encounter for other administrative examinations: Secondary | ICD-10-CM

## 2013-11-12 NOTE — Telephone Encounter (Signed)
Close encounter 

## 2013-11-17 ENCOUNTER — Encounter: Payer: Self-pay | Admitting: Cardiovascular Disease

## 2013-11-17 ENCOUNTER — Ambulatory Visit (INDEPENDENT_AMBULATORY_CARE_PROVIDER_SITE_OTHER): Payer: Medicare Other | Admitting: Cardiovascular Disease

## 2013-11-17 VITALS — BP 158/80 | HR 68 | Ht 70.5 in | Wt 182.3 lb

## 2013-11-17 DIAGNOSIS — I739 Peripheral vascular disease, unspecified: Secondary | ICD-10-CM

## 2013-11-17 DIAGNOSIS — I2510 Atherosclerotic heart disease of native coronary artery without angina pectoris: Secondary | ICD-10-CM

## 2013-11-17 NOTE — Progress Notes (Signed)
11/17/2013 Harold [name]   February 11, 1929  [id number]  Primary Physician Chancy Hurter, MD Primary Cardiologist: Lorretta Harp MD Renae Gloss   HPI:  Harold [name] is a 80 year old mildly overweight married Caucasian male father of one child, and father of one grandchild who is retired from owning a car business. His primary care physician was Dr. Phoebe Sharps and his cardiologist Dr. Loralie Champagne . He was Limited to the hospital for hypotension, bradycardia and volume overload as well as renal insufficiency. His cardiovascular hospital for history of hypertension, hyperlipidemia and remote tobacco abuse. He did have coronary artery bypass grafting by Dr. Redmond Pulling in 1990 as well as an abdominal aortic aneurysm surgically repaired. He was noted to have a ulcer on his right medial malleolus and does complain of bilateral calf claudication. Lower extremity arterial Doppler studies performed in our office 10/27/13 revealed ABIs of 0.7 bilaterally with occluded SFAs. He does have 1 vessel runoff on the right the dorsalis pedis. His serum creatinine at the time of discharge was 2 stage IV chronic renal insufficiency.   Current Outpatient Prescriptions  Medication Sig Dispense Refill  . ALPRAZolam (XANAX) 0.5 MG tablet Take 0.5 mg by mouth 2 (two) times daily as needed for anxiety.      Marland Kitchen aspirin EC 81 MG EC tablet Take 1 tablet (81 mg total) by mouth daily.      Marland Kitchen buPROPion (WELLBUTRIN XL) 150 MG 24 hr tablet TAKE ONE TABLET BY MOUTH TWICE DAILY  60 tablet  0  . docusate sodium 100 MG CAPS Take 100 mg by mouth 2 (two) times daily.  30 capsule  0  . fluticasone (FLONASE) 50 MCG/ACT nasal spray Place 2 sprays into both nostrils daily.  16 g  2  . furosemide (LASIX) 40 MG tablet Take 1 tablet (40 mg total) by mouth daily.  60 tablet  5  . isosorbide mononitrate (IMDUR) 30 MG 24 hr tablet Take 1 tablet (30 mg total) by mouth daily.  30 tablet  3  . nitroGLYCERIN (NITROSTAT) 0.4 MG  SL tablet Place 0.4 mg under the tongue every 5 (five) minutes as needed for chest pain.      . polyethylene glycol powder (GLYCOLAX/MIRALAX) powder       . potassium chloride (K-DUR) 10 MEQ tablet Take 1 tablet (10 mEq total) by mouth daily.      . simvastatin (ZOCOR) 20 MG tablet Take 20 mg by mouth at bedtime.        No current facility-administered medications for this visit.    No Known Allergies  History   Social History  . Marital Status: Married    Spouse Name: N/A    Number of Children: N/A  . Years of Education: N/A   Occupational History  . Owner of used car lot     Retired   Social History Main Topics  . Smoking status: Former Smoker -- 2.00 packs/day for 52 years    Quit date: 04/30/1988  . Smokeless tobacco: Not on file     Comment: smoked x52 years; up to 2 ppd   . Alcohol Use: 1.2 oz/week    2 Shots of liquor per week     Comment: 2 drink a day  . Drug Use: No  . Sexual Activity: Not Currently   Other Topics Concern  . Not on file   Social History Narrative   Retired used [employer] officer   Does not get regular exercise   Married  with children   Wife has dementia and was recently taken to [facility] to move into assisted living by her dtr, against pts wishes.     Review of Systems: General: negative for chills, fever, night sweats or weight changes.  Cardiovascular: negative for chest pain, dyspnea on exertion, edema, orthopnea, palpitations, paroxysmal nocturnal dyspnea or shortness of breath Dermatological: negative for rash Respiratory: negative for cough or wheezing Urologic: negative for hematuria Abdominal: negative for nausea, vomiting, diarrhea, bright red blood per rectum, melena, or hematemesis Neurologic: negative for visual changes, syncope, or dizziness All other systems reviewed and are otherwise negative except as noted above.    Blood pressure 158/80, pulse 68, height 5' 10.5" (1.791 m), weight 182 lb 4.8 oz (82.691 kg).  General appearance:  alert and no distress Neck: no adenopathy, no carotid bruit, no JVD, supple, symmetrical, trachea midline and thyroid not enlarged, symmetric, no tenderness/mass/nodules Lungs: clear to auscultation bilaterally Heart: regular rate and rhythm, S1, S2 normal, no murmur, click, rub or gallop Extremities: extremities normal, atraumatic, no cyanosis or edema and absent pedal pulses bilaterally  EKG not performed today  ASSESSMENT AND PLAN:   PERIPHERAL VASCULAR DISEASE The patient was referred to me because of peripheral vascular disease. He is a long history of cardiovascular disease status post bypass grafting remotely, aortobifemoral bypass grafting. He was recently hospitalized because of hypotension, via overload and azotemia. He had a ulcer on his right medial malleolus which has since healed. He does complain of bilateral calf claudication. Recent Doppler was performed in the office 10/27/13 with ABIs in the 0.7 range bilaterally with occluded SFAs. He does have one-vessel runoff on the right via a dorsalis pedis.      Lorretta Harp MD FACP,FACC,FAHA, [facility] 11/17/2013 3:11 PM

## 2013-11-17 NOTE — Patient Instructions (Signed)
Your physician recommends that you schedule a follow-up appointment AS NEEDED with Dr.Berry  

## 2013-11-17 NOTE — Assessment & Plan Note (Addendum)
The patient was referred to me because of peripheral vascular disease. He is a long history of cardiovascular disease status post bypass grafting remotely, aortobifemoral bypass grafting. He was recently hospitalized because of hypotension, via overload and azotemia. He had a ulcer on his right medial malleolus which has since healed. He does complain of bilateral calf claudication. Recent Doppler was performed in the office 10/27/13 with ABIs in the 0.7 range bilaterally with occluded SFAs. He does have one-vessel runoff on the right via a dorsalis pedis.at this point, given his ulcer has healed and he has chronic renal insufficiency despite the fact that he has lifestyle limiting claudication, I do not feel he is a candidate for angiography and percutaneous revascularization.

## 2013-11-19 ENCOUNTER — Encounter: Payer: Self-pay | Admitting: Family Medicine

## 2013-11-19 ENCOUNTER — Ambulatory Visit (INDEPENDENT_AMBULATORY_CARE_PROVIDER_SITE_OTHER): Payer: Medicare Other | Admitting: Family Medicine

## 2013-11-19 VITALS — BP 154/86 | HR 86 | Temp 98.0°F | Ht 70.5 in | Wt 182.0 lb

## 2013-11-19 DIAGNOSIS — F329 Major depressive disorder, single episode, unspecified: Secondary | ICD-10-CM

## 2013-11-19 DIAGNOSIS — F3289 Other specified depressive episodes: Secondary | ICD-10-CM

## 2013-11-19 DIAGNOSIS — F32A Depression, unspecified: Secondary | ICD-10-CM

## 2013-11-19 DIAGNOSIS — G250 Essential tremor: Secondary | ICD-10-CM

## 2013-11-19 DIAGNOSIS — G252 Other specified forms of tremor: Secondary | ICD-10-CM

## 2013-11-19 DIAGNOSIS — Z9181 History of falling: Secondary | ICD-10-CM | POA: Insufficient documentation

## 2013-11-19 DIAGNOSIS — R251 Tremor, unspecified: Secondary | ICD-10-CM

## 2013-11-19 DIAGNOSIS — H3530 Unspecified macular degeneration: Secondary | ICD-10-CM

## 2013-11-19 DIAGNOSIS — R259 Unspecified abnormal involuntary movements: Secondary | ICD-10-CM

## 2013-11-19 NOTE — Assessment & Plan Note (Signed)
Patient with right arm tremor since at least 2012 that I suspect is essential tremor. Given complex medical history, will refer to neurology for treatment. I discussed with patient that I did not think xanax was appropriate therapy for this especially with his history of falls. He is agreeable to stopping medication as already off of it for 3 weeks.

## 2013-11-19 NOTE — Patient Instructions (Addendum)
Anxiety/Depression/anger management related to wife and impatience  I would like for you to visit our counselor, Richardo Priest (call # on handout)  We decided to not change your bupropion since you have done so well on it  Due to your history of falls, we decided to avoid xanax as this could contribute   Hand shaking (right worse than left)  I believe this is essential tremor  There are other medications outside of benzodiazepines that can help  We will refer to neurology for their assistance given your complex medical history  Please use a cane or walker at all times

## 2013-11-19 NOTE — Assessment & Plan Note (Signed)
On wellbutrin for many years. Has some anxiety issues and irritability. Discussed SSRI may be better choice but due to stability, decision made to not chanage (despite phq9 of 9). Instead opted to pursue behavioral health counseling as adjunct to therapy. May still need to consider change in therapy in future if cannot control better with therapy and wellbutrin. With anxiety, discussed xanax not a good choice given fall risk and we have permanently discontinued this medication.

## 2013-11-19 NOTE — Progress Notes (Signed)
Garret Reddish, MD Phone: [phone]  Subjective:   Harold [name] is a 80 y.o. year old very pleasant male patient who presented to establish care but with several concerns to discuss today and unable to do full history review as planned:  Depression phq 9 of 9. Admits to long term difficulties with depression. He has been stable on wellbutrin. He also used xanax for some anxiety in the past. Has never met with a counselor or psychology or psychiatry. Symptoms have worsened as his wife was taken to [facility] to live with their daughter (and he was told it was short term trip). His difficulty seeing due to macular degeneration also frustrates him. He states he is easily agitated and has a short temper. He has asked for anger management help in the past.  ROS- No SI/HI  Tremor Tremor of right hand > left hand. Long term issue for years but seemed to be helped by xanax of which he ran out a month ago. ROS- no extremity weakness  Fall risk 4 falls in last 6 months. 2 due to orthostatic hypotension and has had BP meds reduced as result. Has cane and walker at home but does not always take with him.  ROS- no trauma to head or continued pain from falls  Past Medical History- CKD stage IV, OSA, CHF, essential tremor, macular degeneration, HTN, HLD, AAA s/p repair  Medications- reviewed and updated Current Outpatient Prescriptions  Medication Sig Dispense Refill  . aspirin EC 81 MG EC tablet Take 1 tablet (81 mg total) by mouth daily.      Marland Kitchen buPROPion (WELLBUTRIN XL) 150 MG 24 hr tablet TAKE ONE TABLET BY MOUTH TWICE DAILY  60 tablet  0  . docusate sodium 100 MG CAPS Take 100 mg by mouth 2 (two) times daily.  30 capsule  0  . fluticasone (FLONASE) 50 MCG/ACT nasal spray Place 2 sprays into both nostrils daily.  16 g  2  . furosemide (LASIX) 40 MG tablet Take 1 tablet (40 mg total) by mouth daily.  60 tablet  5  . isosorbide mononitrate (IMDUR) 30 MG 24 hr tablet Take 1 tablet (30 mg total) by  mouth daily.  30 tablet  3  . nitroGLYCERIN (NITROSTAT) 0.4 MG SL tablet Place 0.4 mg under the tongue every 5 (five) minutes as needed for chest pain.      . polyethylene glycol powder (GLYCOLAX/MIRALAX) powder       . potassium chloride (K-DUR) 10 MEQ tablet Take 1 tablet (10 mEq total) by mouth daily.      . simvastatin (ZOCOR) 20 MG tablet Take 20 mg by mouth at bedtime.        No current facility-administered medications for this visit.    Objective: BP 154/86  Pulse 86  Temp(Src) 98 F (36.7 C) (Oral)  Ht 5' 10.5" (1.791 m)  Wt 182 lb (82.555 kg)  BMI 25.74 kg/m2 Gen: NAD, resting comfortably leaning against table CV: RRR no murmurs rubs or gallops Lungs: CTAB no crackles, wheeze, rhonchi Ext: 1+ pitting edema (stable per patient) Skin: warm, dry Neuro: no tremor at rest, tremor when trying to write or hold object or shake hand  Assessment/Plan:  Tremor, essential Patient with right arm tremor since at least 2012 that I suspect is essential tremor. Given complex medical history, will refer to neurology for treatment. I discussed with patient that I did not think xanax was appropriate therapy for this especially with his history of falls. He is  agreeable to stopping medication as already off of it for 3 weeks.   Depression On wellbutrin for many years. Has some anxiety issues and irritability. Discussed SSRI may be better choice but due to stability, decision made to not chanage (despite phq9 of 9). Instead opted to pursue behavioral health counseling as adjunct to therapy. May still need to consider change in therapy in future if cannot control better with therapy and wellbutrin. With anxiety, discussed xanax not a good choice given fall risk and we have permanently discontinued this medication.   Risk for falls Poor vision and xanax both contributing. D/c xanax. Advised patient to always use a walker or cane.    Orders Placed This Encounter  Procedures  . Ambulatory  referral to Neurology    Referral Priority:  Routine    Referral Type:  Consultation    Referral Reason:  Specialty Services Required    Requested Specialty:  Neurology    Number of Visits Requested:  1

## 2013-11-19 NOTE — Assessment & Plan Note (Signed)
Poor vision and xanax both contributing. D/c xanax. Advised patient to always use a walker or cane.

## 2013-11-19 NOTE — Progress Notes (Signed)
Pre visit review using our clinic review tool, if applicable. No additional management support is needed unless otherwise documented below in the visit note. 

## 2013-11-27 ENCOUNTER — Other Ambulatory Visit: Payer: Self-pay | Admitting: Internal Medicine

## 2013-11-27 ENCOUNTER — Other Ambulatory Visit: Payer: Self-pay | Admitting: Cardiology

## 2013-12-07 ENCOUNTER — Encounter: Payer: Self-pay | Admitting: Physician Assistant

## 2013-12-07 ENCOUNTER — Ambulatory Visit (INDEPENDENT_AMBULATORY_CARE_PROVIDER_SITE_OTHER): Payer: Medicare Other | Admitting: Physician Assistant

## 2013-12-07 VITALS — BP 160/80 | HR 85 | Ht 70.5 in | Wt 176.0 lb

## 2013-12-07 DIAGNOSIS — E785 Hyperlipidemia, unspecified: Secondary | ICD-10-CM

## 2013-12-07 DIAGNOSIS — I739 Peripheral vascular disease, unspecified: Secondary | ICD-10-CM

## 2013-12-07 DIAGNOSIS — I10 Essential (primary) hypertension: Secondary | ICD-10-CM

## 2013-12-07 DIAGNOSIS — I2510 Atherosclerotic heart disease of native coronary artery without angina pectoris: Secondary | ICD-10-CM

## 2013-12-07 DIAGNOSIS — I5032 Chronic diastolic (congestive) heart failure: Secondary | ICD-10-CM

## 2013-12-07 MED ORDER — ISOSORBIDE MONONITRATE ER 60 MG PO TB24: 60.0000 mg | ORAL_TABLET | Freq: Every day | ORAL | Status: DC

## 2013-12-07 NOTE — Progress Notes (Signed)
Cardiology Office Note    Date:  12/07/2013   ID:  Harold [name], DOB 08-19-1928, MRN [id number]  PCP:  Harold Reddish, MD  Cardiologist:  Dr. Loralie [name]      History of Present Illness: Harold [name] is a 80 y.o. male with a hx of CAD, s/p CABG in 1998 and multiple PCI's, diastolic CHF, macular degeneration (legally blind), sleep apnea, prior AAA repair, HTN, CKD.  Admitted 07/3152 for a/c diastolic CHF complicated by AKI.  Echo demonstrated normal LVF.  Readmitted in 09/2013 with hypotension and bradycardia.  Amlodipine, Losartan, Coreg were stopped.  He had a nonhealing ulcer on the left lower extremity at the ankle. ABIs were abnormal (right 0.56, left 0.60).  He was seen by Dr. Gwenlyn [name] as an outpatient.  He recommended conservative management (ulcer healed; CKD).    I last saw him 10/16/13. He returns for follow up.  He is doing ok.  His appetite is poor and he has lost weight.  He denies significant dyspnea.  Denies chest pain, orthopnea, PND, syncope. LE edema is stable.    Studies:  - LHC (09/06/08):  Mid left main 70%, LAD occluded, circumflex occluded, RCA occluded, LIMA-LAD patent with 40% within the stent at the anastomosis, SVG-diagonal occluded, SVG-circumflex occluded, SVG-RCA occluded - medical therapy  - Echo (09/26/13):  Mild LVH, EF 00-86%, grade 2 diastolic dysfunction, moderate MR  - ABI (09/27/13):  R 0.56; L 0.6   Recent Labs: 07/13/2013: HDL Cholesterol by NMR 36.90*; LDL (calc) 67  09/26/2013: TSH 2.770  10/02/2013: Hemoglobin 11.6*; Pro B Natriuretic peptide (BNP) 1868.0*  10/03/2013: ALT 6  10/16/2013: Creatinine 2.0*; Potassium 4.2   Wt Readings from Last 3 Encounters:  12/07/13 176 lb (79.833 kg)  11/19/13 182 lb (82.555 kg)  11/17/13 182 lb 4.8 oz (82.691 kg)     Past Medical History:  1. CAD: CABG in 1998. Last cath in 2010 showed all native vessels occluded and all SVGs occluded, LIMA-LAD was patent with good collaterals to other  circulations. No interventional options and was not a good candidate for redo surgery.  2. Macular degeneration.  3. Gout  4. AAA s/p repair  5. PAD 6. OSA: CPAP  7. Diastolic CHF: Echo (7/61) with EF 55%, inferior hypokinesis, grade III diastolic dysfunction (restrictive), no significant valvular dysfunction. Echo (2/13) with EF 55%, moderate LVH, moderate diastolic dysfunciton, normal RV, mild MR.  8. CKD   Past Medical History  Diagnosis Date  . CAD (coronary artery disease)     a. no known MImultiple PCIs;  b. S/P CABG;  c. all vein grafts occluded, LIMA patent by cath 08/2008.  Marland Kitchen Gout   . Macular degeneration     followed at [facility]  . PVD (peripheral vascular disease)   . OSA (obstructive sleep apnea)     using CPAP  . Diastolic heart failure     a. 08/2013 Echo: EF 60-65%, mild LVH, Gr 2 DD, mod MR.  Marland Kitchen Hypertension   . Hyperlipidemia   . Depression     symptoms  . CKD (chronic kidney disease), stage IV   . Renal adenoma     hx  . Nephrolithiasis     hx   . Hx of skin cancer, basal cell   . AAA (abdominal aortic aneurysm) 2002    3.2 cm, with repair  . History of tobacco use     Remote (same for alcohol use)  . Claudication     a. 08/2013  ABI's R: 0.56, L 0.60 - f/u with Dr. Gwenlyn [name] scheduled.  . Bradycardia     a. 08/2013  . Hypotension     a. 08/2013  . CKD (chronic kidney disease) stage 4, GFR 15-29 ml/min 10/04/2013  . OSA on CPAP 10/04/2013  . Lower extremity ulceration     right medial malleolus, healed    Current Outpatient Prescriptions  Medication Sig Dispense Refill  . ALPRAZolam (XANAX) 0.5 MG tablet Take 0.5 mg by mouth at bedtime as needed. As needed      . aspirin EC 81 MG EC tablet Take 1 tablet (81 mg total) by mouth daily.      Marland Kitchen buPROPion (WELLBUTRIN XL) 150 MG 24 hr tablet TAKE ONE TABLET BY MOUTH TWICE DAILY  60 tablet  11  . docusate sodium 100 MG CAPS Take 100 mg by mouth 2 (two) times daily.  30 capsule  0  . fluticasone (FLONASE) 50 MCG/ACT nasal  spray Place 2 sprays into both nostrils daily.  16 g  2  . furosemide (LASIX) 40 MG tablet Take 1 tablet (40 mg total) by mouth daily.  60 tablet  5  . isosorbide mononitrate (IMDUR) 30 MG 24 hr tablet Take 1 tablet (30 mg total) by mouth daily.  30 tablet  3  . nitroGLYCERIN (NITROSTAT) 0.4 MG SL tablet Place 0.4 mg under the tongue every 5 (five) minutes as needed for chest pain.      . polyethylene glycol powder (GLYCOLAX/MIRALAX) powder       . potassium chloride (K-DUR) 10 MEQ tablet Take 1 tablet (10 mEq total) by mouth daily.      . simvastatin (ZOCOR) 20 MG tablet TAKE ONE TABLET BY MOUTH EVERY DAY  30 tablet  1   No current facility-administered medications for this visit.    Allergies:   Review of patient's allergies indicates no known allergies.   Social History:  The patient  reports that he quit smoking about 25 years ago. He does not have any smokeless tobacco history on file. He reports that he drinks about 1.2 ounces of alcohol per week. He reports that he does not use illicit drugs.   Family History:  The patient's family history includes Emphysema in his father; Heart attack in his father; Heart disease in his paternal uncle; Hypertension in his father.   ROS:  Please see the history of present illness.     All other systems reviewed and negative.   PHYSICAL EXAM: VS:  BP 160/80  Pulse 85  Ht 5' 10.5" (1.791 m)  Wt 176 lb (79.833 kg)  BMI 24.89 kg/m2 Well nourished, well developed, in no acute distress HEENT: normal Neck: no JVD Cardiac:  normal S1, S2; RRR; no murmur Lungs:  clear to auscultation bilaterally, no wheezing, rhonchi or rales Abd: soft, nontender, no hepatomegaly Ext: trace bilateral LE edema Skin: warm and dry Neuro:  CNs 2-12 intact, no focal abnormalities noted  EKG:  NSR, HR 85, rightward axis, inf-lat TWI, no change from prior tracing     ASSESSMENT AND PLAN:  Chronic Diastolic CHF:  Volume stable.  Continue current Rx.   Coronary Artery  Disease s/p CABG (all SVGs known to be occluded):  No angina. Continue aspirin, statin.  Essential hypertension:  BP is starting to increase.  His Losartan, amlodipine and coreg were all stopped in 09/2013 due to hypotension.  Coreg was stopped in the setting of bradycardia.  I will increase his Imdur to 60 mg QD  first to see if this helps his BP.  Consider adding amlodipine back at 2.5 mg QD if BP remains elevated.   CKD (chronic kidney disease) stage 4, GFR 15-29 ml/min:  Stable when last checked.   HYPERLIPIDEMIA:  Continue statin.  PAD (peripheral artery disease):  Conservative management as noted.   A A A s/p Repair   Disposition:  F/u with Dr. Loralie [name] in 3 mos.    Signed, Versie Starks, MHS 12/07/2013 2:14 PM    [facility] [place], [place], [place]  [address] Phone: [phone]; Fax: [phone]

## 2013-12-07 NOTE — Patient Instructions (Signed)
Your physician has recommended you make the following change in your medication:   1. Increase Imdur to 60 mg daily. I have called your pharmacy to let them know.   Your physician recommends that you schedule a follow-up appointment in: 3 months with Dr. Aundra Dubin.

## 2013-12-11 ENCOUNTER — Other Ambulatory Visit: Payer: Self-pay | Admitting: Cardiology

## 2013-12-11 ENCOUNTER — Other Ambulatory Visit: Payer: Self-pay | Admitting: *Deleted

## 2013-12-11 MED ORDER — ISOSORBIDE MONONITRATE ER 60 MG PO TB24: 60.0000 mg | ORAL_TABLET | Freq: Every day | ORAL | Status: DC

## 2013-12-15 ENCOUNTER — Telehealth: Payer: Self-pay | Admitting: Cardiovascular Disease

## 2013-12-15 NOTE — Telephone Encounter (Signed)
New message          C/o swollen feet

## 2013-12-15 NOTE — Telephone Encounter (Signed)
Pt called again,says his feet are swollen more than they normally are.

## 2013-12-15 NOTE — Telephone Encounter (Signed)
Returned a call to patient. He reports that he is having some ankle swelling. It will go down overnight and tends to return the next day. He states that he does let his feet hang down while sitting. Denies having SOB. Recommended to patient to wear some support stockings. He says that he has tried to wear them in the past and he cannot wear them. So then I recommended for him to keep his feet elevated when he can. If he develops any increased SOB or if the swelling increases and / or persists please call back to inform. Patient voiced understanding of the recommendations.

## 2013-12-16 ENCOUNTER — Telehealth: Payer: Self-pay | Admitting: Cardiovascular Disease

## 2013-12-16 NOTE — Telephone Encounter (Signed)
Returned a call to patient informing him per the pharmacist, Dr. Aundra Dubin increased him isosorbide from 30 mg to 60 mg. Patient wanted to know the reason for the increase. I told him that he would need to call Dr. Claris Gladden office to find out why he increased the medication. We cannot answer that question. Patient voiced understanding and will call  Dr. Claris Gladden office.

## 2013-12-16 NOTE — Telephone Encounter (Signed)
Pt called in stating that he received a call from his pharmacy saying that one of his medications were going to be increased but he did not know which one so he would like to speak to Dr. Kennon Holter nurse for some clarity. Please call  Thanks

## 2013-12-18 ENCOUNTER — Other Ambulatory Visit: Payer: Self-pay | Admitting: Cardiology

## 2013-12-28 ENCOUNTER — Ambulatory Visit: Payer: Medicare Other | Admitting: Neurology

## 2014-01-06 ENCOUNTER — Telehealth: Payer: Self-pay | Admitting: Cardiology

## 2014-01-06 NOTE — Telephone Encounter (Signed)
Pt states he was told by his pharmacy that his BP medication had been increased. He does not know the name of the medication. Scott Weaver,PA,c increased Imdur to 60mg  daily 12/07/13 so I think this is the medication pt is referring to. Pt requesting an appt to come to discuss BP and medications. I offered pt an appt tomorrow but he declined. I have given pt an appt 01/19/14 with Versie Starks. Pt is going to try to check his BP regularly and bring a record of the readings to his appt.

## 2014-01-06 NOTE — Telephone Encounter (Signed)
New message           Pt would like to talk to nurse about a prescription change for bp medicine

## 2014-01-12 ENCOUNTER — Encounter: Payer: Self-pay | Admitting: Neurology

## 2014-01-12 ENCOUNTER — Telehealth: Payer: Self-pay | Admitting: Neurology

## 2014-01-12 ENCOUNTER — Ambulatory Visit (INDEPENDENT_AMBULATORY_CARE_PROVIDER_SITE_OTHER): Payer: Medicare Other | Admitting: Neurology

## 2014-01-12 VITALS — BP 148/80 | HR 77 | Ht 70.5 in | Wt 179.0 lb

## 2014-01-12 DIAGNOSIS — G20 Parkinson's disease: Secondary | ICD-10-CM

## 2014-01-12 DIAGNOSIS — G622 Polyneuropathy due to other toxic agents: Secondary | ICD-10-CM

## 2014-01-12 DIAGNOSIS — I2510 Atherosclerotic heart disease of native coronary artery without angina pectoris: Secondary | ICD-10-CM

## 2014-01-12 DIAGNOSIS — IMO0002 Reserved for concepts with insufficient information to code with codable children: Secondary | ICD-10-CM

## 2014-01-12 MED ORDER — CARBIDOPA-LEVODOPA 25-100 MG PO TABS: 1.0000 | ORAL_TABLET | Freq: Three times a day (TID) | ORAL | Status: DC

## 2014-01-12 NOTE — Patient Instructions (Addendum)
1. Start Carbidopa Levodopa as follows: 1/2 tab three times a day before meals x 1 wk, then 1/2 in am & noon & 1 in evening for a week, then 1/2 in am &1 at noon &one in evening for a week, then 1 tablet three times a day before meals. 2. We have referred you to [facility] for physical therapy. They will contact you directly to set up care. They can be reached at [phone]. 3. Recommend that you discontinue alcohol.

## 2014-01-12 NOTE — Progress Notes (Addendum)
Subjective:   Harold [name] was seen in consultation in the movement disorder clinic at the request of Garret Reddish, MD.  The evaluation is for tremor.  Pt accompanied by his friend and driver, who supplements the history.  Prior records were reviewed.  Pt has a hx of tremor, R greater than L at least since 2012.  Was previously on xanax for this but recently d/c as felt inappropriate therapy given 4 falls in last 6 months.  Pt reports that he has tremor in the R hand with use.  It needs to have some weight in the hand for it to happen.  Pt is frustrated that his xanax cannot will not be filled.  Thinks that is why he is here today (to get refill for xanax).  Specific Symptoms:  Tremor: Yes.    Affected by caffeine:  Yes.   (1/2 cup coffee per day)  Affected by alcohol:  Yes.   (2-3 drinks of bourbon per night)  Affected by stress:  No.  Affected by fatigue:  No.  Spills soup if on spoon:  Yes.    Spills glass of liquid if full:  Yes.   if trying to get it up to mouth  Affects ADL's (tying shoes, brushing teeth, etc):  Some - had to switch to electric razor Voice: no changes in voice Sleep: sleeps well  Vivid Dreams:  Yes.    Acting out dreams:  No. Wet Pillows: No. Postural symptoms:  Yes.    Falls?  Yes.   (states that falls related to tripping; most recent tripped over phone cord; one prior to that, tripped over throw rug trying to answer phone) Bradykinesia symptoms: slow movements and difficulty getting out of a chair Loss of smell:  No. Loss of taste:  No. Urinary Incontinence:  No. Difficulty Swallowing:  No. Handwriting, micrographia: No. Trouble with ADL's:  Yes.    Trouble buttoning clothing: Yes.   Depression:  No. but some frustration related to wifes dementia Memory changes:  Yes.   just over last 3-4 weeks (forgets to use cane; doesn't drive due to macular degeneration; friend of family pays bills) N/V:  Yes.   Lightheaded:  No. (denies although saw hx of [place]  in records)  Syncope: No. Dyskinesia:  No.     ALLERGIES:  No Known Allergies  CURRENT MEDICATIONS:  Outpatient Encounter Prescriptions as of 01/12/2014  Medication Sig  . aspirin EC 81 MG EC tablet Take 1 tablet (81 mg total) by mouth daily.  Marland Kitchen buPROPion (WELLBUTRIN XL) 150 MG 24 hr tablet TAKE ONE TABLET BY MOUTH TWICE DAILY  . docusate sodium 100 MG CAPS Take 100 mg by mouth 2 (two) times daily.  . fluticasone (FLONASE) 50 MCG/ACT nasal spray Place 2 sprays into both nostrils daily.  . furosemide (LASIX) 40 MG tablet Take 1 tablet (40 mg total) by mouth daily.  . isosorbide mononitrate (IMDUR) 60 MG 24 hr tablet Take 1 tablet (60 mg total) by mouth daily.  . nitroGLYCERIN (NITROSTAT) 0.4 MG SL tablet Place 0.4 mg under the tongue every 5 (five) minutes as needed for chest pain.  . polyethylene glycol powder (GLYCOLAX/MIRALAX) powder   . potassium chloride (K-DUR) 10 MEQ tablet Take 1 tablet (10 mEq total) by mouth daily.  . simvastatin (ZOCOR) 20 MG tablet TAKE ONE TABLET BY MOUTH EVERY DAY  . carbidopa-levodopa (SINEMET IR) 25-100 MG per tablet Take 1 tablet by mouth 3 (three) times daily.    PAST MEDICAL  HISTORY:   Past Medical History  Diagnosis Date  . CAD (coronary artery disease)     a. no known MImultiple PCIs;  b. S/P CABG;  c. all vein grafts occluded, LIMA patent by cath 08/2008.  Marland Kitchen Gout   . Macular degeneration     followed at [facility]  . PVD (peripheral vascular disease)   . OSA (obstructive sleep apnea)     using CPAP  . Diastolic heart failure     a. 08/2013 Echo: EF 60-65%, mild LVH, Gr 2 DD, mod MR.  Marland Kitchen Hypertension   . Hyperlipidemia   . Depression     symptoms  . CKD (chronic kidney disease), stage IV   . Renal adenoma     hx  . Nephrolithiasis     hx   . Hx of skin cancer, basal cell   . AAA (abdominal aortic aneurysm) 2002    3.2 cm, with repair  . History of tobacco use     Remote (same for alcohol use)  . Claudication     a. 08/2013 ABI's R: 0.56,  L 0.60 - f/u with Dr. Gwenlyn Found scheduled.  . Bradycardia     a. 08/2013  . Hypotension     a. 08/2013  . CKD (chronic kidney disease) stage 4, GFR 15-29 ml/min 10/04/2013  . OSA on CPAP 10/04/2013  . Lower extremity ulceration     right medial malleolus, healed    PAST SURGICAL HISTORY:   Past Surgical History  Procedure Laterality Date  . Abdominal aortic aneurysm repair    . Transurethral resection of prostate    . Coronary artery bypass graft  1990    s/p multiple PCIs with occlusion of al SVGs and only patnet LIMA at cath 5/10   . Cholecystectomy    . Lumbar disc surgery      x5  . Coronary stent placement      multiple; 12/02  . Back surgery    . Prostate surgery      SOCIAL HISTORY:   History   Social History  . Marital Status: Married    Spouse Name: N/A    Number of Children: N/A  . Years of Education: N/A   Occupational History  . Owner of used car lot     Retired   Social History Main Topics  . Smoking status: Former Smoker -- 2.00 packs/day for 52 years    Quit date: 04/30/1988  . Smokeless tobacco: Not on file     Comment: smoked x52 years; up to 2 ppd   . Alcohol Use: Yes     Comment: 2-3 drink a day  . Drug Use: No  . Sexual Activity: Not Currently   Other Topics Concern  . Not on file   Social History Narrative   Retired used [employer] officer   Does not get regular exercise   Married with children   Wife has dementia and was recently taken to [facility] to move into assisted living by her dtr, against pts wishes.    FAMILY HISTORY:   Family Status  Relation Status Death Age  . Father Deceased     emphysema, heart attack  . Paternal Aunt      rheumatism   . Mother Deceased 48    stroke  . Son Alive     healthy    ROS:  A complete 10 system review of systems was obtained and was unremarkable apart from what is mentioned above.  PHYSICAL EXAMINATION:  VITALS:   Filed Vitals:   01/12/14 1321  BP: 148/80  Pulse: 77  Height: 5' 10.5" (1.791 m)    Weight: 179 lb (81.194 kg)    GEN:  The patient appears stated age and is in NAD. HEENT:  Normocephalic, atraumatic.  The mucous membranes are moist. The superficial temporal arteries are without ropiness or tenderness. CV:  RRR Lungs:  CTAB Neck/HEME:  There are no carotid bruits bilaterally.  Neurological examination:  Orientation: The patient is alert and oriented x3. Fund of knowledge is appropriate.  Recent and remote memory are intact.  Attention and concentration are normal.    Able to name objects and repeat phrases. Cranial nerves: There is good facial symmetry. There is significant facial hypomimia.  Pupils are equal round and minimally reactive to light bilaterally. Fundoscopic exam reveals clear margins bilaterally. Extraocular muscles are intact. The visual fields are full to confrontational testing as long as the object in front of him is very large (can count fingers).  Visual acuity is poor from macular degeneration. The speech is fluent and clear.  He is hypophonic.  Soft palate rises symmetrically and there is no tongue deviation. Hearing is intact to conversational tone. Sensation: Sensation is intact to light and pinprick throughout (facial, trunk, extremities). Vibration is absent at the bilateral big toe. There is no extinction with double simultaneous stimulation. There is no sensory dermatomal level identified. Motor: Strength is 5/5 in the left upper and bilateral lower extremities.  The patient cannot abduct the right shoulder.  Grip strength is good and equal bilaterally.   There is no pronator drift. Deep tendon reflexes: Deep tendon reflexes are 1/4 at the bilateral biceps, triceps, brachioradialis, and absent at the bilateral patella and achilles. Plantar responses are downgoing bilaterally.  Movement examination: Tone: There is moderate increased tone in the bilateral upper extremities.  The tone in the lower extremities is moderately increased.  Abnormal  movements: None Coordination:  There is decremation with RAM's, seen most prominently with finger taps bilaterally but also noted with hand opening and closing bilaterally. Gait and Station: The patient has significant difficulty arising out of a deep-seated chair without the use of the hands. The patient's stride length is markedly decreased.  He shuffles.  He stops in doorways.  ASSESSMENT/PLAN:  1.Parkinsonism.  I suspect that this does represent idiopathic Parkinson's disease.  The patient has tremor, bradykinesia, rigidity and postural instability.  Likely did have ET at one time.  -We discussed the diagnosis as well as pathophysiology of the disease.  We discussed treatment options as well as prognostic indicators.  Patient education was provided.  -Greater than 50% of the 80 minute visit was spent in counseling answering questions and talking about what to expect now as well as in the future.    We talked about safety in the home.  -We decided to add carbidopa/levodopa 25/100.  1/2 tab tid x 1 wk, then 1/2 in am & noon & 1 at night for a week, then 1/2 in am &1 at noon &night for a week, then 1 po tid.  Risks, benefits, side effects and alternative therapies were discussed.  The opportunity to ask questions was given and they were answered to the best of my ability.  The patient expressed understanding and willingness to follow the outlined treatment protocols.  -I will refer the patient to [facility] for PT  -Agree with PCP that xanax is not good choice.  Pt asked me for this med, but  told him that he shouldn't be taking it.  He is fall risk. 2.  Gait change  -Likely multifactorial.  Certainly, his gait instability is largely due to Parkinson's disease, but I also think that peripheral neuropathy, likely due to alcohol, place a large role.  He and I discussed this today.  I would like to see him discontinue his alcohol altogether, but told him that he would need to do this safely, under the care  of his care physician. 3.  Probable R rotator cuff injury  -may need to assess further with MRI but don't think that pt would be surgical candidate even if tear found. 4. Greater than 50% of 60 min visit in counseling discussing safety. 5.  Return in about 8 weeks (around 03/09/2014).

## 2014-01-12 NOTE — Telephone Encounter (Signed)
Referral to [facility] for Parkinson's Program PT faxed to [phone] with confirmation received. They will contact patient.  

## 2014-01-19 ENCOUNTER — Ambulatory Visit (INDEPENDENT_AMBULATORY_CARE_PROVIDER_SITE_OTHER): Payer: Medicare Other | Admitting: Physician Assistant

## 2014-01-19 ENCOUNTER — Encounter: Payer: Self-pay | Admitting: Physician Assistant

## 2014-01-19 VITALS — BP 150/74 | HR 84 | Ht 70.5 in | Wt 180.0 lb

## 2014-01-19 DIAGNOSIS — E785 Hyperlipidemia, unspecified: Secondary | ICD-10-CM

## 2014-01-19 DIAGNOSIS — I714 Abdominal aortic aneurysm, without rupture, unspecified: Secondary | ICD-10-CM

## 2014-01-19 DIAGNOSIS — I5032 Chronic diastolic (congestive) heart failure: Secondary | ICD-10-CM

## 2014-01-19 DIAGNOSIS — I2510 Atherosclerotic heart disease of native coronary artery without angina pectoris: Secondary | ICD-10-CM

## 2014-01-19 DIAGNOSIS — I739 Peripheral vascular disease, unspecified: Secondary | ICD-10-CM

## 2014-01-19 DIAGNOSIS — N184 Chronic kidney disease, stage 4 (severe): Secondary | ICD-10-CM

## 2014-01-19 DIAGNOSIS — I10 Essential (primary) hypertension: Secondary | ICD-10-CM

## 2014-01-19 MED ORDER — FUROSEMIDE 40 MG PO TABS: 60.0000 mg | ORAL_TABLET | Freq: Every day | ORAL | Status: DC

## 2014-01-19 NOTE — Progress Notes (Signed)
Cardiology Office Note    Date:  01/19/2014   ID:  Harold [name], DOB [birth year], MRN [id number]  PCP:  Harold Reddish, MD  Cardiologist:  Dr. Loralie [name]      History of Present Illness: Harold [name] is a 80 y.o. male with a hx of CAD, s/p CABG in 1998 and multiple PCI's, diastolic CHF, macular degeneration (legally blind), sleep apnea, prior AAA repair, HTN, CKD.  Admitted 10/6224 for a/c diastolic CHF complicated by AKI.  Echo demonstrated normal LVF.  Readmitted in 09/2013 with hypotension and bradycardia.  Amlodipine, Losartan, Coreg were stopped.  He had a nonhealing ulcer on the left lower extremity at the ankle. ABIs were abnormal (right 0.56, left 0.60).  He was seen by Dr. Gwenlyn [name] as an outpatient.  He recommended conservative management (ulcer healed; CKD).    I saw him 12/07/13.  BP was higher.  I increased his Imdur.  In the interim, he has seen Neurology and dx with Parkinsonism.  Carbidopa/levodopa was started.  He returns for FU.  Blood pressure is improved. He does note fatigue. The patient denies any chest pain, significant dyspnea, syncope, orthopnea, PND. He does note increased LE edema.   Studies:  - LHC (09/06/08):  Mid left main 70%, LAD occluded, circumflex occluded, RCA occluded, LIMA-LAD patent with 40% within the stent at the anastomosis, SVG-diagonal occluded, SVG-circumflex occluded, SVG-RCA occluded - medical therapy  - Echo (09/26/13):  Mild LVH, EF 33-35%, grade 2 diastolic dysfunction, moderate MR  - ABI (09/27/13):  R 0.56; L 0.6   Recent Labs: 07/13/2013: HDL Cholesterol by NMR 36.90*; LDL (calc) 67  09/26/2013: TSH 2.770  10/02/2013: Hemoglobin 11.6*; Pro B Natriuretic peptide (BNP) 1868.0*  10/03/2013: ALT 6  10/16/2013: Creatinine 2.0*; Potassium 4.2   Wt Readings from Last 3 Encounters:  01/19/14 180 lb (81.647 kg)  01/12/14 179 lb (81.194 kg)  12/07/13 176 lb (79.833 kg)     Past Medical History:  1. CAD: CABG in 1998. Last cath in  2010 showed all native vessels occluded and all SVGs occluded, LIMA-LAD was patent with good collaterals to other circulations. No interventional options and was not a good candidate for redo surgery.  2. Macular degeneration.  3. Gout  4. AAA s/p repair  5. PAD 6. OSA: CPAP  7. Diastolic CHF: Echo (4/56) with EF 55%, inferior hypokinesis, grade III diastolic dysfunction (restrictive), no significant valvular dysfunction. Echo (2/13) with EF 55%, moderate LVH, moderate diastolic dysfunciton, normal RV, mild MR.  8. CKD   Past Medical History  Diagnosis Date  . CAD (coronary artery disease)     a. no known MImultiple PCIs;  b. S/P CABG;  c. all vein grafts occluded, LIMA patent by cath 08/2008.  Marland Kitchen Gout   . Macular degeneration     [facility]  . PVD (peripheral vascular disease)   . OSA (obstructive sleep apnea)     using CPAP  . Diastolic heart failure     a. 08/2013 Echo: EF 60-65%, mild LVH, Gr 2 DD, mod MR.  Marland Kitchen Hypertension   . Hyperlipidemia   . Depression     symptoms  . CKD (chronic kidney disease), stage IV   . Renal adenoma     hx  . Nephrolithiasis     hx   . Hx of skin cancer, basal cell   . AAA (abdominal aortic aneurysm) 2002    3.2 cm, with repair  . History of tobacco use  Remote (same for alcohol use)  . Claudication     a. 08/2013 ABI's R: 0.56, L 0.60 - f/u with Dr. Gwenlyn [name] scheduled.  . Bradycardia     a. 08/2013  . Hypotension     a. 08/2013  . CKD (chronic kidney disease) stage 4, GFR 15-29 ml/min 10/04/2013  . OSA on CPAP 10/04/2013  . Lower extremity ulceration     right medial malleolus, healed    Current Outpatient Prescriptions  Medication Sig Dispense Refill  . aspirin EC 81 MG EC tablet Take 1 tablet (81 mg total) by mouth daily.      Marland Kitchen buPROPion (WELLBUTRIN XL) 150 MG 24 hr tablet TAKE ONE TABLET BY MOUTH TWICE DAILY  60 tablet  11  . carbidopa-levodopa (SINEMET IR) 25-100 MG per tablet Take 1 tablet by mouth 3 (three) times daily.  90  tablet  2  . docusate sodium 100 MG CAPS Take 100 mg by mouth 2 (two) times daily.  30 capsule  0  . fluticasone (FLONASE) 50 MCG/ACT nasal spray Place 2 sprays into both nostrils daily.  16 g  2  . furosemide (LASIX) 40 MG tablet Take 1 tablet (40 mg total) by mouth daily.  60 tablet  5  . isosorbide mononitrate (IMDUR) 60 MG 24 hr tablet Take 1 tablet (60 mg total) by mouth daily.  30 tablet  3  . nitroGLYCERIN (NITROSTAT) 0.4 MG SL tablet Place 0.4 mg under the tongue every 5 (five) minutes as needed for chest pain.      . polyethylene glycol powder (GLYCOLAX/MIRALAX) powder Take 0.5 Containers by mouth daily.       . potassium chloride (K-DUR) 10 MEQ tablet Take 1 tablet (10 mEq total) by mouth daily.      . simvastatin (ZOCOR) 20 MG tablet TAKE ONE TABLET BY MOUTH EVERY DAY  30 tablet  1   No current facility-administered medications for this visit.    Allergies:   Review of patient's allergies indicates no known allergies.   Social History:  The patient  reports that he quit smoking about 25 years ago. He does not have any smokeless tobacco history on file. He reports that he drinks alcohol. He reports that he does not use illicit drugs.   Family History:  The patient's family history includes Emphysema in his father; Heart attack in his father; Heart disease in his paternal uncle; Hypertension in his father.   ROS:  Please see the history of present illness.      All other systems reviewed and negative.   PHYSICAL EXAM: VS:  BP 150/74  Pulse 84  Ht 5' 10.5" (1.791 m)  Wt 180 lb (81.647 kg)  BMI 25.45 kg/m2 Well nourished, well developed, in no acute distress HEENT: normal Neck: no JVDat 90 Cardiac:  normal S1, S2; RRR; 7-8/9 systolic murmurLSB Lungs:  clear to auscultation bilaterally, no wheezing, rhonchi or rales Abd: soft, nontender, no hepatomegaly Ext: 1+ bilateral ankle edema Skin: warm and dry Neuro:  CNs 2-12 intact, no focal abnormalities noted     ASSESSMENT  AND PLAN:  1. Chronic Diastolic CHF:  His weight is increased. His LE edema has increased. I reviewed his chart. He was previously on a higher dose of Lasix. I will increase his Lasix to 60 mg daily. Check a basic metabolic panel in one week. 2. Coronary Artery Disease s/p CABG (all SVGs known to be occluded):  No angina. Continue aspirin, statin. 3. Essential hypertension:  Better control.  Continue current therapy 4. CKD (chronic kidney disease) stage 4, GFR 15-29 ml/min:  follow up basic metabolic panel in one week. 5. HYPERLIPIDEMIA:  Continue statin. 6. PAD (peripheral artery disease):  Conservative management as noted.  7. A A A s/p Repair:  Arrange follow up abdominal US.   Disposition:  FU with Dr. Loralie [name] in November as planned. He will follow up with Dr. Aundra Dubin in the heart failure clinic thereafter.    Signed, Versie Starks, MHS 01/19/2014 11:55 AM    [facility] [place], [place], [place]  [address] Phone: [phone]; Fax: [phone]

## 2014-01-19 NOTE — Patient Instructions (Signed)
Your physician recommends that you schedule a follow-up appointment in:   [place] Your physician has recommended you make the following change in your medication:  INCREASE FUROSEMIDE  TO   [place]  Your physician recommends that you return for lab work in: BM[place] IN  [place] physician has requested that you have an abdominal aorta duplex. During this test, an ultrasound is used to evaluate the aorta. Allow 30 minutes for this exam. Do not eat after midnight the day before and avoid carbonated beverages F/U ON  AAA REPAIR

## 2014-01-26 ENCOUNTER — Other Ambulatory Visit (INDEPENDENT_AMBULATORY_CARE_PROVIDER_SITE_OTHER): Payer: Medicare Other | Admitting: *Deleted

## 2014-01-26 DIAGNOSIS — N184 Chronic kidney disease, stage 4 (severe): Secondary | ICD-10-CM

## 2014-01-26 DIAGNOSIS — I10 Essential (primary) hypertension: Secondary | ICD-10-CM

## 2014-01-27 ENCOUNTER — Telehealth: Payer: Self-pay | Admitting: *Deleted

## 2014-01-27 LAB — BASIC METABOLIC PANEL
BUN: 19 mg/dL (ref 6–23)
CALCIUM: 8.9 mg/dL (ref 8.4–10.5)
CHLORIDE: 102 meq/L (ref 96–112)
CO2: 29 mEq/L (ref 19–32)
CREATININE: 1.9 mg/dL — AB (ref 0.4–1.5)
GFR: 36.86 mL/min — ABNORMAL LOW (ref >60.00)
Glucose, Bld: 107 mg/dL — ABNORMAL HIGH (ref 70–99)
Potassium: 3.9 mEq/L (ref 3.5–5.1)
Sodium: 139 mEq/L (ref 135–145)

## 2014-01-27 NOTE — Telephone Encounter (Signed)
pt notified about lab results with verbal understanding  

## 2014-03-09 ENCOUNTER — Encounter: Payer: Self-pay | Admitting: Neurology

## 2014-03-09 ENCOUNTER — Ambulatory Visit (INDEPENDENT_AMBULATORY_CARE_PROVIDER_SITE_OTHER): Payer: Medicare Other | Admitting: Neurology

## 2014-03-09 VITALS — BP 146/68 | HR 100 | Ht 70.5 in | Wt 179.0 lb

## 2014-03-09 DIAGNOSIS — IMO0002 Reserved for concepts with insufficient information to code with codable children: Secondary | ICD-10-CM

## 2014-03-09 DIAGNOSIS — I2510 Atherosclerotic heart disease of native coronary artery without angina pectoris: Secondary | ICD-10-CM

## 2014-03-09 DIAGNOSIS — G250 Essential tremor: Secondary | ICD-10-CM

## 2014-03-09 DIAGNOSIS — G622 Polyneuropathy due to other toxic agents: Secondary | ICD-10-CM

## 2014-03-09 DIAGNOSIS — G20 Parkinson's disease: Secondary | ICD-10-CM

## 2014-03-09 NOTE — Patient Instructions (Signed)
1. Hold your Levodopa- we will call you on Monday to see how you are doing.

## 2014-03-09 NOTE — Progress Notes (Signed)
Subjective:   Harold [name] was seen in consultation in the movement disorder clinic at the request of Harold Reddish, MD.  The evaluation is for tremor.  Pt accompanied by his friend and driver, who supplements the history.  Prior records were reviewed.  Pt has a hx of tremor, R greater than L at least since 2012.  Was previously on xanax for this but recently d/c as felt inappropriate therapy given 4 falls in last 6 months.  Pt reports that he has tremor in the R hand with use.  It needs to have some weight in the hand for it to happen.  Pt is frustrated that his xanax cannot will not be filled.  Thinks that is why he is here today (to get refill for xanax).  03/09/14 update:  Pt accompanied by his friend and driver, who supplements the history. Last visit, pt was dx with PD and was started on carbidopa/levodopa 25/100 tid.  Pt reports that he fell into his bushes 1 week ago.  Has had no other falls.  The [name] fills his medication into a tray every other week.  He does not think that the pill helps the tremor and thinks that it makes him sleepy.  Takes it at 10am/1:30pm/bedtime (10-11pm).  Tremor is most bothersome when he eats and he cannot get the food to the mouth without spilling it.   Minor lightheadedness.  Is not driving but wasn't driving because of macular degeneration.  Did not think that PT was helpful; only came 2 weeks and felt that sessions were only 2-3 weeks, 1 time per week.  Still drinking 2-3 drinks per night, consisting of vodka and water.  States that he is off of the xanax.    ALLERGIES:  No Known Allergies  CURRENT MEDICATIONS:  Outpatient Encounter Prescriptions as of 03/09/2014  Medication Sig  . aspirin EC 81 MG EC tablet Take 1 tablet (81 mg total) by mouth daily.  Marland Kitchen buPROPion (WELLBUTRIN XL) 150 MG 24 hr tablet TAKE ONE TABLET BY MOUTH TWICE DAILY  . carbidopa-levodopa (SINEMET IR) 25-100 MG per tablet Take 1 tablet by mouth 3 (three) times  daily.  Marland Kitchen docusate sodium 100 MG CAPS Take 100 mg by mouth 2 (two) times daily.  . fluticasone (FLONASE) 50 MCG/ACT nasal spray Place 2 sprays into both nostrils daily.  . furosemide (LASIX) 40 MG tablet Take 1.5 tablets (60 mg total) by mouth daily.  . isosorbide mononitrate (IMDUR) 60 MG 24 hr tablet Take 1 tablet (60 mg total) by mouth daily.  . polyethylene glycol powder (GLYCOLAX/MIRALAX) powder Take 0.5 Containers by mouth daily.   . potassium chloride (K-DUR) 10 MEQ tablet Take 1 tablet (10 mEq total) by mouth daily.  . simvastatin (ZOCOR) 20 MG tablet TAKE ONE TABLET BY MOUTH EVERY DAY  . nitroGLYCERIN (NITROSTAT) 0.4 MG SL tablet Place 0.4 mg under the tongue every 5 (five) minutes as needed for chest pain.    PAST MEDICAL HISTORY:   Past Medical History  Diagnosis Date  . CAD (coronary artery disease)     a. no known MImultiple PCIs;  b. S/P CABG;  c. all vein grafts occluded, LIMA patent by cath 08/2008.  Marland Kitchen Gout   . Macular degeneration     followed at [facility]  . PVD (peripheral vascular disease)   . OSA (obstructive sleep apnea)     using CPAP  . Diastolic heart failure     a. 08/2013 Echo: EF 60-65%,  mild LVH, Gr 2 DD, mod MR.  Marland Kitchen Hypertension   . Hyperlipidemia   . Depression     symptoms  . CKD (chronic kidney disease), stage IV   . Renal adenoma     hx  . Nephrolithiasis     hx   . Hx of skin cancer, basal cell   . AAA (abdominal aortic aneurysm) 2002    3.2 cm, with repair  . History of tobacco use     Remote (same for alcohol use)  . Claudication     a. 08/2013 ABI's R: 0.56, L 0.60 - f/u with Dr. Gwenlyn Found scheduled.  . Bradycardia     a. 08/2013  . Hypotension     a. 08/2013  . CKD (chronic kidney disease) stage 4, GFR 15-29 ml/min 10/04/2013  . OSA on CPAP 10/04/2013  . Lower extremity ulceration     right medial malleolus, healed    PAST SURGICAL HISTORY:   Past Surgical History  Procedure Laterality Date  . Abdominal aortic aneurysm repair    .  Transurethral resection of prostate    . Coronary artery bypass graft  1990    s/p multiple PCIs with occlusion of al SVGs and only patnet LIMA at cath 5/10   . Cholecystectomy    . Lumbar disc surgery      x5  . Coronary stent placement      multiple; 12/02  . Back surgery    . Prostate surgery      SOCIAL HISTORY:   History   Social History  . Marital Status: Married    Spouse Name: N/A    Number of Children: N/A  . Years of Education: N/A   Occupational History  . Owner of used car lot     Retired   Social History Main Topics  . Smoking status: Former Smoker -- 2.00 packs/day for 52 years    Quit date: 04/30/1988  . Smokeless tobacco: Not on file     Comment: smoked x52 years; up to 2 ppd   . Alcohol Use: Yes     Comment: 2-3 drink a day  . Drug Use: No  . Sexual Activity: Not Currently   Other Topics Concern  . Not on file   Social History Narrative   Retired used [employer] officer   Does not get regular exercise   Married with children   Wife has dementia and was recently taken to [facility] to move into assisted living by her dtr, against pts wishes.    FAMILY HISTORY:   Family Status  Relation Status Death Age  . Father Deceased     emphysema, heart attack  . Paternal Aunt      rheumatism   . Mother Deceased 80    stroke  . Son Alive     healthy    ROS:  A complete 10 system review of systems was obtained and was unremarkable apart from what is mentioned above.  PHYSICAL EXAMINATION:    VITALS:   Filed Vitals:   03/09/14 1433  BP: 146/68  Pulse: 100  Height: 5' 10.5" (1.791 m)  Weight: 179 lb (81.194 kg)    GEN:  The patient appears stated age and is in NAD. HEENT:  Normocephalic, atraumatic.  The mucous membranes are moist. The superficial temporal arteries are without ropiness or tenderness. CV:  RRR Lungs:  CTAB Neck/HEME:  There are no carotid bruits bilaterally.  Neurological examination:  Orientation: The patient is alert and oriented  x3.  Fund of knowledge is appropriate.  Recent and remote memory are intact.  Attention and concentration are normal.    Able to name objects and repeat phrases. Cranial nerves: There is good facial symmetry. There is  facial hypomimia.  The visual fields are full to confrontational testing as long as the object in front of him is very large (can count fingers).  Visual acuity is poor from macular degeneration. The speech is fluent and clear.  He is hypophonic.  Soft palate rises symmetrically and there is no tongue deviation. Hearing is intact to conversational tone. Sensation: Sensation is intact to light touch throughout. Motor: Strength is 5/5 in the left upper and bilateral lower extremities.  The patient cannot abduct the right shoulder.  Grip strength is good and equal bilaterally.   There is no pronator drift.  Movement examination: Tone: There is normal tone in the UE today, which is a marked improvement Abnormal movements: None at rest.  Intention tremor on the R Coordination:  There is minimal decremation with RAM's, seen most prominently with finger taps bilaterally but also noted with hand opening and closing bilaterally. Gait and Station: The patient has significant difficulty arising out of a deep-seated chair without the use of the hands. The patient's stride length is decreased but much improved and he is shuffling less.  Doesn't stop in doorways this time.  ASSESSMENT/PLAN:  1.Parkinsonism.  I suspect that this does represent idiopathic Parkinson's disease.  The patient has tremor, bradykinesia, rigidity and postural instability.  Likely did have ET at one time.  -I had a long discussion with the patient and his friend today.  Now that he is on levodopa, he no longer has rigidity and his walking is actually much improved.  However, the patient thinks that it makes him sleepy.  This is an unusual side effect.  I gave him several options which included staying on this medication, switching  to the old CR version, or trying the new Rytary.  In the end, we decided to hold the medication until next Monday to see if the sleepiness changes.  If it does not, then we are just going to go back on the current carbidopa/levodopa 25/100 3 times per day.  If it does, then we will go to the less preferable over CR version, because of cost.  -The tremor he has is not parkinsonian.  He has a history of essential tremor and the tremor he has represents that.  I explained to him that I really do not want to give him medication for that, because of the side effects that come along with those medications in his age group.  I know that this frustrates him and seems to be the most bothersome thing to him but explained to him that I just think it would be a good idea to add more medication for this  -extensive patient education provided.  Talked about the importance of safe, cardiovascular exercise. 2.  Gait change  -Likely multifactorial.  Certainly, his gait instability is largely due to Parkinson's disease, but I also think that peripheral neuropathy, likely due to alcohol, place a large role.  He and I discussed this again today.  I would like to see him discontinue his alcohol altogether, but told him that he would need to do this safely, under the care of his care physician.  He really is not interested in this, but I encouraged him nonetheless. 3.  Probable R rotator cuff injury  -may need  to assess further with MRI but don't think that pt would be surgical candidate even if tear found. 4. Greater than 50% of 40 min visit in counseling as above. 5.  Return in about 3 months (around 06/09/2014).

## 2014-03-11 ENCOUNTER — Other Ambulatory Visit: Payer: Self-pay | Admitting: Neurology

## 2014-03-12 ENCOUNTER — Other Ambulatory Visit: Payer: Self-pay | Admitting: Cardiology

## 2014-03-15 ENCOUNTER — Telehealth: Payer: Self-pay | Admitting: Neurology

## 2014-03-15 NOTE — Telephone Encounter (Signed)
Tried to call patient to see if he is having the same amount of sleepiness. No answer and no way to leave message will try again later.  If still sleepy go back on IR Levodopa and if not sleepy we will switch to CR formulation. Will discuss after we talk with patient.

## 2014-03-16 MED ORDER — CARBIDOPA-LEVODOPA ER 25-100 MG PO TBCR: 1.0000 | EXTENDED_RELEASE_TABLET | Freq: Three times a day (TID) | ORAL | Status: DC

## 2014-03-16 NOTE — Telephone Encounter (Signed)
Spoke with patient and he states he is not feeling as sleepy as he was off the Carbidopa Levodopa IR 25/100. He can still fall asleep easily if he is sitting around doing nothing for too long, but does not feel the overwhelming sleepiness he felt. Made him aware we will send in a prescription for the CR formulation to his pharmacy to see if that helps his symptoms and doesn't cause sleepiness.

## 2014-03-19 ENCOUNTER — Encounter (HOSPITAL_COMMUNITY): Payer: Medicare Other

## 2014-03-19 ENCOUNTER — Ambulatory Visit: Payer: Medicare Other | Admitting: Cardiology

## 2014-03-23 ENCOUNTER — Encounter (HOSPITAL_COMMUNITY): Payer: Medicare Other

## 2014-04-07 ENCOUNTER — Other Ambulatory Visit: Payer: Self-pay | Admitting: Cardiology

## 2014-05-07 ENCOUNTER — Ambulatory Visit: Payer: Medicare Other | Admitting: Cardiology

## 2014-05-07 ENCOUNTER — Other Ambulatory Visit: Payer: Self-pay | Admitting: Cardiology

## 2014-05-07 ENCOUNTER — Other Ambulatory Visit: Payer: Self-pay | Admitting: Physician Assistant

## 2014-06-02 ENCOUNTER — Other Ambulatory Visit: Payer: Self-pay | Admitting: Neurology

## 2014-06-02 NOTE — Telephone Encounter (Signed)
Carbidopa Levodopa ER refill requested. Per last office note- patient to remain on medication. Refill approved and sent to patient's pharmacy.

## 2014-06-04 ENCOUNTER — Other Ambulatory Visit: Payer: Self-pay | Admitting: Cardiology

## 2014-06-08 ENCOUNTER — Telehealth: Payer: Self-pay | Admitting: Neurology

## 2014-06-08 NOTE — Telephone Encounter (Signed)
Pt called back and resch appt from 06-14-14 to 06-29-14

## 2014-06-08 NOTE — Telephone Encounter (Signed)
Pt has to have a afternoon appt so he states that we could move his appt from 06-11-14 to 06-14-14 so we resch appt to 06-14-14

## 2014-06-11 ENCOUNTER — Ambulatory Visit: Payer: Medicare Other | Admitting: Neurology

## 2014-06-14 ENCOUNTER — Ambulatory Visit: Payer: Medicare Other | Admitting: Neurology

## 2014-06-25 ENCOUNTER — Telehealth: Payer: Self-pay | Admitting: Family Medicine

## 2014-06-25 NOTE — Telephone Encounter (Signed)
[facility] - Client [place] [facility] Patient Name: Harold [name] DOB: [birth year] Initial Comment Caller States pt has had 2 falls within the last 2 days. says he doesnt feel right, and some dizziness. current bp is 180/99. last night he took a double does of his meds. Nurse Assessment Nurse: Mechele Dawley, RN, Amy Date/Time Eilene Ghazi Time): 06/25/2014 12:36:52 PM Confirm and document reason for call. If symptomatic, describe symptoms. ---CALLER STATES THAT HE TAKES WELLBUTRIN, SINEMET, SIMVASTATIN. HE TAKES IMDUR IN THE DAY TIME IN THE A.M. CAREGIVER STATES THAT NOBODY WAS THERE YESTERDAY. HE WAS BY HIMSELF ON THURSDAY AND HE HAD A FALL THAT WAS REPORTED TO THE CAREGIVER. WEDNESDAY HE WAS LAYING IN THE HALLWAY AND WHEN THE CAREGIVER CAME THEY HAD TO CALL EMS TO GET INTO THE HOUSE. HE HAD INJURY TO RIGHT UPPER ARM NEXT TO THE ELBOW. INJURY TO HIS LEFT HAND, LEFT UPPER ARM TO THE ELBOW. SHE THINKS THEY ARE SKIN TEARS. BP IS 180/99. WEDNESDAY BP WAS 160/110 - AROUND NOON, AND THURSDAY AROUND 1100. HE STATES THAT HE FEELS VERY DIZZY AND DOES NOT FEEL WELL. HE GOT VERY DIZZY AND HAD TO SIT BACK DOWN. Has the patient traveled out of the country within the last 30 days? ---Not Applicable Does the patient require triage? ---Yes Related visit to physician within the last 2 weeks? ---No Does the PT have any chronic conditions? (i.e. diabetes, asthma, etc.) ---Yes List chronic conditions. ---BP ISSUES, PARKINSONS, Guidelines Guideline Title Affirmed Question Affirmed Notes Dizziness - Lightheadedness SEVERE dizziness (e.g., unable to stand, requires support to walk, feels like passing out now) Final Disposition User Go to ED Now (or PCP triage) [place], Therapist, sports, Amy

## 2014-06-25 NOTE — Telephone Encounter (Signed)
Noted  

## 2014-06-29 ENCOUNTER — Ambulatory Visit: Payer: Medicare Other | Admitting: Neurology

## 2014-07-02 ENCOUNTER — Ambulatory Visit: Payer: Medicare Other | Admitting: Neurology

## 2014-07-06 ENCOUNTER — Telehealth: Payer: Self-pay | Admitting: Neurology

## 2014-07-06 NOTE — Telephone Encounter (Signed)
Pt canceled appt to see Dr Tat on 07-08-14 and did not resch

## 2014-07-08 ENCOUNTER — Ambulatory Visit: Payer: Medicare Other | Admitting: Neurology

## 2014-07-27 ENCOUNTER — Other Ambulatory Visit: Payer: Self-pay | Admitting: Cardiology

## 2014-07-28 ENCOUNTER — Other Ambulatory Visit: Payer: Self-pay | Admitting: Cardiology

## 2014-08-12 ENCOUNTER — Encounter: Payer: Self-pay | Admitting: Neurology

## 2014-08-12 ENCOUNTER — Telehealth: Payer: Self-pay | Admitting: Neurology

## 2014-08-12 ENCOUNTER — Ambulatory Visit (INDEPENDENT_AMBULATORY_CARE_PROVIDER_SITE_OTHER): Payer: Medicare Other | Admitting: Neurology

## 2014-08-12 VITALS — BP 164/72 | HR 80 | Ht 70.5 in | Wt 173.0 lb

## 2014-08-12 DIAGNOSIS — G20 Parkinson's disease: Secondary | ICD-10-CM | POA: Diagnosis not present

## 2014-08-12 DIAGNOSIS — G622 Polyneuropathy due to other toxic agents: Secondary | ICD-10-CM | POA: Diagnosis not present

## 2014-08-12 DIAGNOSIS — R1319 Other dysphagia: Secondary | ICD-10-CM

## 2014-08-12 DIAGNOSIS — K5901 Slow transit constipation: Secondary | ICD-10-CM | POA: Diagnosis not present

## 2014-08-12 DIAGNOSIS — F458 Other somatoform disorders: Secondary | ICD-10-CM | POA: Diagnosis not present

## 2014-08-12 DIAGNOSIS — IMO0002 Reserved for concepts with insufficient information to code with codable children: Secondary | ICD-10-CM

## 2014-08-12 NOTE — Patient Instructions (Signed)
1. Constipation and Parkinson's disease:   1.Rancho recipe for constipation in Parkinsons Disease:  -1 cup of bran, 2 cups of applesauce in 1 cup of prune juice  2.  Increase fiber intake (Metamucil,vegetables)  3.  Regular, moderate exercise can be beneficial.  4.  Avoid medications causing constipation, such as medications like antacids with calcium or magnesium  5.  Laxative overuse should be avoided.  6.  Stool softeners (Colace) can help with chronic constipation.   2. Stop Carbidopa Levodopa.  3. We have referred you to [facility] for physical and occupational therapy. They will contact you directly to set up care. They can be reached at [phone]. 4. We will scheduled you at [facility] for your modified barium swallow. The scheduler is out until Monday, so we will call you with an appt. Please arrive 15 minutes prior and go to 1st floor radiology. If you need to reschedule for any reason please call [phone].

## 2014-08-12 NOTE — Progress Notes (Signed)
Subjective:   Harold [name] was seen in consultation in the movement disorder clinic at the request of Harold Reddish, MD.  The evaluation is for tremor.  Pt accompanied by his friend and driver, who supplements the history.  Prior records were reviewed.  Pt has a hx of tremor, R greater than L at least since 2012.  Was previously on xanax for this but recently d/c as felt inappropriate therapy given 4 falls in last 6 months.  Pt reports that he has tremor in the R hand with use.  It needs to have some weight in the hand for it to happen.  Pt is frustrated that his xanax cannot will not be filled.  Thinks that is why he is here today (to get refill for xanax).  03/09/14 update:  Pt accompanied by his friend and driver, who supplements the history. Last visit, pt was dx with PD and was started on carbidopa/levodopa 25/100 tid.  Pt reports that he fell into his bushes 1 week ago.  Has had no other falls.  The [facility] fills his medication into a tray every other week.  He does not think that the pill helps the tremor and thinks that it makes him sleepy.  Takes it at 10am/1:30pm/bedtime (10-11pm).  Tremor is most bothersome when he eats and he cannot get the food to the mouth without spilling it.   Minor lightheadedness.  Is not driving but wasn't driving because of macular degeneration.  Did not think that PT was helpful; only came 2 weeks and felt that sessions were only 2-3 weeks, 1 time per week.  Still drinking 2-3 drinks per night, consisting of vodka and water.  States that he is off of the xanax.    08/12/14 update:  Pt accompanied by a caregiver who supplements the history.  Last visit, the patient thought that the levodopa was making him sleepy although I was not completely convinced.  Nonetheless, we changed him to the CR preparation as he thought that he was less sleepy when we stopped the medication for a few days.  He returns today on carbidopa/levodopa 25/100 CR bid.  He is  not taking the second dose of levodopa until bedtime.  He still thinks it makes him sleepy.  He complains of dysphagia.  He complains of multiple falls.  He continues to drink 2-3 alcoholic drinks per night.  He complains about constipation and dysphagia.  He is having memory change.  He has a lesion on the right face and headache biopsied.  It was not cancer.  He was told that it was scar tissue like and if it does not go away they could try Silvadene cream.  ALLERGIES:  No Known Allergies  CURRENT MEDICATIONS:  Outpatient Encounter Prescriptions as of 08/12/2014  Medication Sig  . buPROPion (WELLBUTRIN XL) 150 MG 24 hr tablet TAKE ONE TABLET BY MOUTH TWICE DAILY  . Carbidopa-Levodopa ER (SINEMET CR) 25-100 MG tablet controlled release TAKE ONE TABLET BY MOUTH THREE TIMES DAILY (Patient taking differently: TAKE ONE TABLET BY MOUTH twice DAILY)  . docusate sodium 100 MG CAPS Take 100 mg by mouth 2 (two) times daily.  . fluticasone (FLONASE) 50 MCG/ACT nasal spray Place 2 sprays into both nostrils daily.  . furosemide (LASIX) 40 MG tablet TAKE ONE TABLET BY MOUTH TWICE DAILY  . isosorbide mononitrate (IMDUR) 60 MG 24 hr tablet TAKE ONE TABLET BY MOUTH EVERY DAY  . polyethylene glycol powder (GLYCOLAX/MIRALAX) powder Take 0.5 Containers  by mouth daily.   . potassium chloride (K-DUR) 10 MEQ tablet TAKE TWO TABLETS BY MOUTH EVERY DAY  . simvastatin (ZOCOR) 20 MG tablet TAKE ONE TABLET BY MOUTH EVERY DAY  . nitroGLYCERIN (NITROSTAT) 0.4 MG SL tablet Place 0.4 mg under the tongue every 5 (five) minutes as needed for chest pain.  . [DISCONTINUED] aspirin EC 81 MG EC tablet Take 1 tablet (81 mg total) by mouth daily.    PAST MEDICAL HISTORY:   Past Medical History  Diagnosis Date  . CAD (coronary artery disease)     a. no known MImultiple PCIs;  b. S/P CABG;  c. all vein grafts occluded, LIMA patent by cath 08/2008.  Marland Kitchen Gout   . Macular degeneration     followed at [facility]  . PVD (peripheral vascular  disease)   . OSA (obstructive sleep apnea)     using CPAP  . Diastolic heart failure     a. 08/2013 Echo: EF 60-65%, mild LVH, Gr 2 DD, mod MR.  Marland Kitchen Hypertension   . Hyperlipidemia   . Depression     symptoms  . CKD (chronic kidney disease), stage IV   . Renal adenoma     hx  . Nephrolithiasis     hx   . Hx of skin cancer, basal cell   . AAA (abdominal aortic aneurysm) 2002    3.2 cm, with repair  . History of tobacco use     Remote (same for alcohol use)  . Claudication     a. 08/2013 ABI's R: 0.56, L 0.60 - f/u with Dr. Gwenlyn [name] scheduled.  . Bradycardia     a. 08/2013  . Hypotension     a. 08/2013  . CKD (chronic kidney disease) stage 4, GFR 15-29 ml/min 10/04/2013  . OSA on CPAP 10/04/2013  . Lower extremity ulceration     right medial malleolus, healed    PAST SURGICAL HISTORY:   Past Surgical History  Procedure Laterality Date  . Abdominal aortic aneurysm repair    . Transurethral resection of prostate    . Coronary artery bypass graft  1990    s/p multiple PCIs with occlusion of al SVGs and only patnet LIMA at cath 5/10   . Cholecystectomy    . Lumbar disc surgery      x5  . Coronary stent placement      multiple; 12/02  . Back surgery    . Prostate surgery      SOCIAL HISTORY:   History   Social History  . Marital Status: Married    Spouse Name: N/A  . Number of Children: N/A  . Years of Education: N/A   Occupational History  . Owner of used car lot     Retired   Social History Main Topics  . Smoking status: Former Smoker -- 2.00 packs/day for 52 years    Quit date: 04/30/1988  . Smokeless tobacco: Not on file     Comment: smoked x52 years; up to 2 ppd   . Alcohol Use: Yes     Comment: 2-3 drink a day  . Drug Use: No  . Sexual Activity: Not Currently   Other Topics Concern  . Not on file   Social History Narrative   Retired used [employer] officer   Does not get regular exercise   Married with children   Wife has dementia and was recently taken to [facility] to  move into assisted living by her dtr, against pts wishes.  FAMILY HISTORY:   Family Status  Relation Status Death Age  . Father Deceased     emphysema, heart attack  . Paternal Aunt      rheumatism   . Mother Deceased 92    stroke  . Son Alive     healthy    ROS:  A complete 10 system review of systems was obtained and was unremarkable apart from what is mentioned above.  PHYSICAL EXAMINATION:    VITALS:   Filed Vitals:   08/12/14 1344  BP: 164/72  Pulse: 80  Height: 5' 10.5" (1.791 m)  Weight: 173 lb (78.472 kg)    GEN:  The patient appears stated age and is in NAD. HEENT:  Normocephalic, atraumatic.  The mucous membranes are moist. The superficial temporal arteries are without ropiness or tenderness. CV:  RRR Lungs:  CTAB Neck/HEME:  There are no carotid bruits bilaterally.  Neurological examination:  Orientation: The patient is alert and oriented x3.  Cranial nerves: There is a decreased nasolabial fold on the right. There is  facial hypomimia.  The visual fields are full to confrontational testing as long as the object in front of him is very large (can count fingers).  Visual acuity is poor from macular degeneration. The speech is fluent and clear.  He is hypophonic.  Soft palate rises symmetrically and there is no tongue deviation. Hearing is intact to conversational tone. Sensation: Sensation is intact to light touch throughout. Motor: Strength is 5/5 in the left upper and bilateral lower extremities.  The patient cannot abduct the right shoulder.  Grip strength is good and equal bilaterally.   There is no pronator drift.  Movement examination: Tone: There is normal tone in the UE today Abnormal movements: None at rest.  Intention tremor on the R Coordination:  There is minimal decremation with RAM's, seen most prominently with finger taps bilaterally but also noted with hand opening and closing bilaterally. Gait and Station: The patient has significant  difficulty arising out of a deep-seated chair without the use of the hands. The patient's stride length is decreased and he is shuffling more then he was on the prior visits.    ASSESSMENT/PLAN:  1.Parkinsonism.  I suspect that this does represent idiopathic Parkinson's disease.  The patient has tremor, bradykinesia, rigidity and postural instability.  Likely did have ET at one time.  -The patient still thinks that it is levodopa that is causing daytime hypersomnolence, even though he is on the CR preparation and essentially is only taking it once per day, because he is taking the second dosage at bedtime.  He wants to stop it.  He understands the risks of doing so.  -The tremor he has is not parkinsonian.  He has a history of essential tremor and the tremor he has represents that.  I explained to him that I really do not want to give him medication for that, because of the side effects that come along with those medications in his age group.    -extensive patient education provided.  Talked about the importance of safe, cardiovascular exercise.  -We will send physical and occupational therapy. 2.  Gait change  -Likely multifactorial.  Certainly, his gait instability is largely due to Parkinson's disease, but I also think that peripheral neuropathy, likely due to alcohol, place a large role.  He and I discussed this again today.  He apparently did not remember this talked from prior visits, so we discussed it again today.  I again told  him that I would like to see him discontinue his alcohol.   3.  Probable R rotator cuff injury  -may need to assess further with MRI but don't think that pt would be surgical candidate even if tear [name]. 4.  Constipation  -A copy of the rancho recipe was given 5.  Dysphagia  -We will schedule a modified barium swallow 6.  Memory change  -He probably does have some early dementia, but think alcohol likely plays a role into this as well.  We will do a MoCA next visit.   We can consider medication for this in the future as well. 7. Greater than 50% of 40 min visit in counseling as above. 8.  Return in about 4 months (around 12/12/2014).

## 2014-08-12 NOTE — Telephone Encounter (Signed)
Referral to [facility] for Parkinson's Program PT/OT faxed to [phone] with confirmation received. They will contact patient.

## 2014-08-16 ENCOUNTER — Telehealth: Payer: Self-pay | Admitting: Neurology

## 2014-08-16 NOTE — Telephone Encounter (Signed)
FYI

## 2014-08-16 NOTE — Telephone Encounter (Signed)
[facility] called regarding start of care order for PT. Per [facility], the patient has declined PT at this time. Please call [facility] with any questions, CB# [phone] / [name]

## 2014-08-17 ENCOUNTER — Other Ambulatory Visit (HOSPITAL_COMMUNITY): Payer: Self-pay | Admitting: Neurology

## 2014-08-17 DIAGNOSIS — R1314 Dysphagia, pharyngoesophageal phase: Secondary | ICD-10-CM

## 2014-08-23 ENCOUNTER — Ambulatory Visit (HOSPITAL_COMMUNITY)
Admission: RE | Admit: 2014-08-23 | Discharge: 2014-08-23 | Disposition: A | Discharge status: Home / Self Care | Payer: Medicare Other | Source: Ambulatory Visit | Attending: Neurology | Admitting: Neurology

## 2014-08-23 DIAGNOSIS — R1314 Dysphagia, pharyngoesophageal phase: Secondary | ICD-10-CM | POA: Diagnosis not present

## 2014-08-23 DIAGNOSIS — F458 Other somatoform disorders: Secondary | ICD-10-CM | POA: Diagnosis not present

## 2014-08-23 DIAGNOSIS — R1319 Other dysphagia: Secondary | ICD-10-CM

## 2014-08-23 NOTE — Procedures (Signed)
Objective Swallowing Evaluation: Modified Barium Swallowing Study  Patient Details  Name: Harold [name] MRN: [id number] Date of Birth: 22-Sep-1928  Today's Date: 08/23/2014 Time: No Data Recorded-No Data Recorded No Data Recorded  Past Medical History:  Past Medical History  Diagnosis Date  . CAD (coronary artery disease)     a. no known MImultiple PCIs;  b. S/P CABG;  c. all vein grafts occluded, LIMA patent by cath 08/2008.  Marland Kitchen Gout   . Macular degeneration     followed at [facility]  . PVD (peripheral vascular disease)   . OSA (obstructive sleep apnea)     using CPAP  . Diastolic heart failure     a. 08/2013 Echo: EF 60-65%, mild LVH, Gr 2 DD, mod MR.  Marland Kitchen Hypertension   . Hyperlipidemia   . Depression     symptoms  . CKD (chronic kidney disease), stage IV   . Renal adenoma     hx  . Nephrolithiasis     hx   . Hx of skin cancer, basal cell   . AAA (abdominal aortic aneurysm) 2002    3.2 cm, with repair  . History of tobacco use     Remote (same for alcohol use)  . Claudication     a. 08/2013 ABI's R: 0.56, L 0.60 - f/u with Dr. Gwenlyn Found scheduled.  . Bradycardia     a. 08/2013  . Hypotension     a. 08/2013  . CKD (chronic kidney disease) stage 4, GFR 15-29 ml/min 10/04/2013  . OSA on CPAP 10/04/2013  . Lower extremity ulceration     right medial malleolus, healed   Past Surgical History:  Past Surgical History  Procedure Laterality Date  . Abdominal aortic aneurysm repair    . Transurethral resection of prostate    . Coronary artery bypass graft  1990    s/p multiple PCIs with occlusion of al SVGs and only patnet LIMA at cath 5/10   . Cholecystectomy    . Lumbar disc surgery      x5  . Coronary stent placement      multiple; 12/02  . Back surgery    . Prostate surgery     HPI:  HPI: 81 yo male referred for MBSS due to pt report of occasional coughing with food/drink x a few months.  Pt with PMH + for tremor Rt greater than left since 2012, dysphagia/constipation,  lesion on face s/p surgical removal, CAD, macular degeneration, PVD, OSA, tobacco use, AAA, CKD, diastolic heart failure, HTN, depression, renal adenoma, bradycardia, hypotension, former smoker.  Pt reports only occasionally coughing with food/drinks - he denies pnas, requiring heimlich maneuver, but admits to significant weight loss *30 pounds per pt in uncertain time frame.  Pt denies problems swallowing pills, admits to post nasal drip.    Subjective: pt awake in chair  Assessment / Plan / Recommendation Pt presents with mild oropharyngeal dysphagia without aspiration of any consistency tested.  He demonstrated decreased oral bolus propulsion with barium tablet *taken with thin* resulting in tablet lodging at vallecular space WITH pt awareness.  Further boluses of applesauce facilitate clearance of tablet into esophagus.  Once tablet was in esophagus, it appeared to lodge near UES/below appearance of cervical osteophyte WITHOUT pt sensation.  Pt required more pudding and liquids to facilitate clearance through esophagus.  Trace laryngeal penetration of thin with sequential bolus swallows noted that cleared independently.  Only minimal pharyngeal residuals noted with liquids and NO residuals with pudding and  solid alone.    Please note, pt did NOT cough/choke during this testing.  Advised pt to take his medicine with pudding - start and follow with liquids.  SLP suspects pt has episodic laryngeal penetration and possibly aspiration with food/drink due to findings on this MBS.   Using live video, and moderate cues, reinforced effective compensation strategies.   Also provided material in writing for pt and his caregiver Apolonio Schneiders.  Thanks for this referral.        CHL IP DIET RECOMMENDATION 08/23/2014  Diet Recommendations Thin liquid;Regular  Liquid Administration via Cup  Medication Administration Whole meds with puree  Compensations Slow rate;Small sips/bites  Postural Changes and/or Swallow Maneuvers  Seated upright 90 degrees;Upright 30-60 min after meal     CHL IP OTHER RECOMMENDATIONS 08/23/2014  Recommended Consults (None)  Oral Care Recommendations Oral care BID  Other Recommendations (None)     CHL IP FOLLOW UP RECOMMENDATIONS 08/23/2014  Follow up Recommendations Outpatient SLP      CHL IP REASON FOR REFERRAL 08/23/2014  Reason for Referral Objectively evaluate swallowing function     CHL IP ORAL PHASE 08/23/2014  Oral Phase Impaired  Oral - Pudding Teaspoon (None)  Oral - Nectar Cup WFL  Oral - Thin Teaspoon WFL  Oral - Thin Cup WFL  Oral - Thin Straw WFL  Oral - Puree WFL  Oral - Regular Reduced posterior propulsion;Delayed oral transit  Oral - Pill Reduced posterior propulsion      CHL IP PHARYNGEAL PHASE 08/23/2014  Pharyngeal Phase Impaired  Pharyngeal - Nectar Cup Pharyngeal residue - valleculae;Pharyngeal residue - pyriform sinuses  Pharyngeal - Thin Teaspoon Pharyngeal residue - valleculae;Pharyngeal residue - pyriform sinuses  Pharyngeal - Thin Cup Pharyngeal residue - valleculae;Pharyngeal residue - pyriform sinuses  Pharyngeal - Thin Straw Penetration/Aspiration during swallow;Pharyngeal residue - valleculae;Pharyngeal residue - pyriform sinuses  Penetration/Aspiration details (thin straw) Material enters airway, remains ABOVE vocal cords then ejected out  Pharyngeal - Puree WFL;Premature spillage to valleculae  Pharyngeal - Regular WFL;Premature spillage to valleculae  Pharyngeal - Pill Pharyngeal residue - valleculae  Penetration/Aspiration details (pill) (None)  Pharyngeal Comment ba tablet given with thin lodged at vallecular space, swallow of pudding transited tablet into esophagus     CHL IP CERVICAL ESOPHAGEAL PHASE 08/23/2014  Cervical Esophageal Phase Impaired  Nectar Cup WFL  Thin Teaspoon WFL  Thin Cup WFL  Thin Straw WFL  Cervical Esophageal Comment barium tablet lodged at proximal esophagus - appeared just below region of cervical osteophyte -  pt without sensation but swallows of pudding faciliated clearance     CHL IP GO 08/23/2014  Functional Assessment Tool Used MBS, clinical judgement  Functional Limitations Swallowing  Swallow Current Status (X3818) CI  Swallow Goal Status (E9937) CI  Swallow Discharge Status (J6967) [place], [place] [facility] SLP [phone]

## 2014-09-21 ENCOUNTER — Other Ambulatory Visit: Payer: Self-pay | Admitting: Cardiology

## 2014-09-28 ENCOUNTER — Encounter: Payer: Self-pay | Admitting: Family Medicine

## 2014-09-28 ENCOUNTER — Ambulatory Visit (INDEPENDENT_AMBULATORY_CARE_PROVIDER_SITE_OTHER): Payer: Medicare Other | Admitting: Family Medicine

## 2014-09-28 VITALS — BP 154/92 | HR 76 | Temp 97.8°F | Wt 169.0 lb

## 2014-09-28 DIAGNOSIS — M129 Arthropathy, unspecified: Secondary | ICD-10-CM

## 2014-09-28 DIAGNOSIS — M1990 Unspecified osteoarthritis, unspecified site: Secondary | ICD-10-CM

## 2014-09-28 DIAGNOSIS — G20 Parkinson's disease: Secondary | ICD-10-CM | POA: Insufficient documentation

## 2014-09-28 DIAGNOSIS — M19049 Primary osteoarthritis, unspecified hand: Secondary | ICD-10-CM

## 2014-09-28 NOTE — Progress Notes (Signed)
Garret Reddish, MD  Subjective:  Harold [name] is a 81 y.o. year old very pleasant male patient who presents with:  Stiffness in joints, specifically hands, L knee, toes Osteoarthritis- poor control -concerned about arthritis in hands, L knee, toes.  Been going on several years-stiffness in joints. Goes away quickly when gets to moving.  Denies significant limiting pain in the joints. Knees make it hard to move from the lack of range of motion. Hands make it difficult to grip things at times which is very frustrating. Toes don't bother him except for appearance ROS- no hot, swollen joints  Past Medical History- macular degeneration and does not drive, diastolic CHF, CAD, CKD stage III with GFR near 30, parkinson's disease followed by Dr. Carles Collet  Medications- reviewed and updated Current Outpatient Prescriptions  Medication Sig Dispense Refill  . buPROPion (WELLBUTRIN XL) 150 MG 24 hr tablet TAKE ONE TABLET BY MOUTH TWICE DAILY 60 tablet 11  . Carbidopa-Levodopa ER (SINEMET CR) 25-100 MG tablet controlled release TAKE ONE TABLET BY MOUTH THREE TIMES DAILY (Patient taking differently: TAKE ONE TABLET BY MOUTH twice DAILY) 90 tablet 3  . docusate sodium 100 MG CAPS Take 100 mg by mouth 2 (two) times daily. 30 capsule 0  . furosemide (LASIX) 40 MG tablet TAKE 1 TABLET BY MOUTH 2 TIMES DAILY 30 tablet 1  . isosorbide mononitrate (IMDUR) 60 MG 24 hr tablet TAKE ONE TABLET BY MOUTH EVERY DAY 30 tablet 1  . polyethylene glycol powder (GLYCOLAX/MIRALAX) powder Take 0.5 Containers by mouth daily.     . potassium chloride (K-DUR) 10 MEQ tablet TAKE TWO TABLETS BY MOUTH EVERY DAY 60 tablet 6  . simvastatin (ZOCOR) 20 MG tablet TAKE ONE TABLET BY MOUTH EVERY DAY 30 tablet 1  . fluticasone (FLONASE) 50 MCG/ACT nasal spray Place 2 sprays into both nostrils daily. (Patient not taking: Reported on 09/28/2014) 16 g 2  . nitroGLYCERIN (NITROSTAT) 0.4 MG SL tablet Place 0.4 mg under the tongue every 5 (five)  minutes as needed for chest pain.     Objective: BP 154/92 mmHg  Pulse 76  Temp(Src) 97.8 F (36.6 C)  Wt 169 lb (76.658 kg) Gen: NAD, resting comfortably in chair Slow shuffling gait when walking with cane CV: RRR no murmurs rubs or gallops Lungs: CTAB no crackles, wheeze, rhonchi Ext: no edema Skin: warm, dry, no rash  MSK:  Severe PIP joint arthritis bilaterally and IP thumb. Some pain with palpation of each joint. Cannot fully extend fingress History surgery on right pinky finger and scar visible, this finger more severely contracted than others Accessory bone MTP joint L foot, below distal phalange of R great toe- both below area mentioned.  Neuro: intention tremor right hand unchanged.   Assessment/Plan:  Stiffness in joints, specifically hands, L knee, toes Osteoarthritis- poor control Requests my opinion if anything can be done to prevent worsening. I told him I doubted we could make any significant improvement for any of the joints. Since the hands are causing the worst functional issues, did place referral to hand surgery to consider steroid injections vs. Therapy vs. Other options though I told him I thought options would be limited. We opted not to pursue ortho referral for knee or foot. His stiffness is basically limited joint motion and not typical of RA pattern.   Orders Placed This Encounter  Procedures  . Ambulatory referral to Hand Surgery    Referral Priority:  Routine    Referral Type:  Surgical  Referral Reason:  Specialty Services Required    Requested Specialty:  Hand Surgery    Number of Visits Requested:  1

## 2014-09-28 NOTE — Patient Instructions (Addendum)
Dont think there is much we can do for the knee and do not think anything needs to be done for the feet.   You want to see if there is anything that can be done to prevent progression of arthritis in hands including stiffness and ability gripping things. I think our best option is the hand surgery center and I have referred you at this time.   I would ask that you check in with me in the next 2-3 months so we can continue to review your medical history and follow up on these issues

## 2014-10-19 ENCOUNTER — Other Ambulatory Visit: Payer: Self-pay | Admitting: Cardiology

## 2014-10-19 ENCOUNTER — Other Ambulatory Visit: Payer: Self-pay | Admitting: Family Medicine

## 2014-10-21 ENCOUNTER — Telehealth: Payer: Self-pay | Admitting: Family Medicine

## 2014-10-21 DIAGNOSIS — M79673 Pain in unspecified foot: Secondary | ICD-10-CM

## 2014-10-21 NOTE — Telephone Encounter (Signed)
Is this ok?

## 2014-10-21 NOTE — Telephone Encounter (Signed)
Referral entered  

## 2014-10-21 NOTE — Telephone Encounter (Signed)
Yes thanks 

## 2014-10-21 NOTE — Telephone Encounter (Signed)
Pt would like a referral to podiatry for toe nail cutting and heel spur. Pt medicare. Can we refer?

## 2014-10-30 ENCOUNTER — Other Ambulatory Visit: Payer: Self-pay | Admitting: Cardiology

## 2014-11-04 ENCOUNTER — Ambulatory Visit (INDEPENDENT_AMBULATORY_CARE_PROVIDER_SITE_OTHER): Payer: Medicare Other

## 2014-11-04 ENCOUNTER — Telehealth: Payer: Self-pay | Admitting: *Deleted

## 2014-11-04 ENCOUNTER — Ambulatory Visit (INDEPENDENT_AMBULATORY_CARE_PROVIDER_SITE_OTHER): Payer: Medicare Other | Admitting: Podiatry

## 2014-11-04 VITALS — BP 131/75 | HR 72 | Resp 16

## 2014-11-04 DIAGNOSIS — M79673 Pain in unspecified foot: Secondary | ICD-10-CM

## 2014-11-04 DIAGNOSIS — M79606 Pain in leg, unspecified: Secondary | ICD-10-CM

## 2014-11-04 DIAGNOSIS — B351 Tinea unguium: Secondary | ICD-10-CM

## 2014-11-04 DIAGNOSIS — M0630 Rheumatoid nodule, unspecified site: Secondary | ICD-10-CM

## 2014-11-04 DIAGNOSIS — M6740 Ganglion, unspecified site: Secondary | ICD-10-CM

## 2014-11-04 DIAGNOSIS — R52 Pain, unspecified: Secondary | ICD-10-CM

## 2014-11-04 DIAGNOSIS — L723 Sebaceous cyst: Secondary | ICD-10-CM | POA: Diagnosis not present

## 2014-11-04 DIAGNOSIS — M19079 Primary osteoarthritis, unspecified ankle and foot: Secondary | ICD-10-CM

## 2014-11-04 DIAGNOSIS — M1990 Unspecified osteoarthritis, unspecified site: Secondary | ICD-10-CM

## 2014-11-04 NOTE — Progress Notes (Signed)
   Subjective:    Patient ID: Harold [name], male    DOB: [birth year], 81 y.o.   MRN: [id number]  HPI He needs to have his toenails cut, he is shuffling when walking the left foot has a heel spur , and the bottom of the left foot has a cyst of some sort, the right great toe is swollen , cyst on the bottom of the right great toe. The toes are numb.  More on the right foot than the left foot. Has a difficult time walking , has macular degeneration . He states that he has Parkinson's disease but is not currently taking medication for this due to a disagreement with the neurologist. He states that his feet are terribly painful and his caregiver states that he shuffles.  Review of Systems     Objective:   Physical Exam: I have reviewed his past medical history medications allergies surgery social history and review of systems area pulses are strongly palpable bilateral. Neurologic sensorium is slightly diminished per Semmes-Weinstein monofilament to the level of the toes from the metatarsophalangeal joints. Deep tendon reflexes are non-elicitable bilateral. Muscle strength +5 out of 5 dorsiflexor plantar flexors and inverters and everters all intrinsic musculature appears to be intact. Ambulation is limited to a shuffled gait. Orthopedic evaluation demonstrates minimal range of motion of any joint distal to the ankle. Both feet feel very much like a block of cement with fair little to no motion at any of the joints. Radiographically findings are similar with severe osteoarthritic changes or rheumatic changes to nearly all of the joints. Large masses of tissue appeared to be rheumatic nodules to the plantar aspect of the bilateral hallux and the posterior aspect of the left heel also found on the left elbow. These are noncalcified on radiographs but very dense tissue. Cutaneous evaluation also demonstrates not only these soft tissue tumors but dystrophic nails as well.        Assessment & Plan:    Assessment: Dystrophic nails bilateral. Severe foot pain bilateral. Rheumatoid nodules bilateral foot left elbow. Parkinson's disease with a shuffled gait. No tremor distally.  Plan: Discussed etiology pathology conservative versus surgical therapies. Debrided his nails for him today. We are making a referral to rheumatology.

## 2014-11-04 NOTE — Telephone Encounter (Signed)
Dr. Milinda Pointer referred pt to Drs. Anderson or [place] for evaluation and treatment of severe arthritis.

## 2014-12-13 ENCOUNTER — Other Ambulatory Visit: Payer: Self-pay | Admitting: Cardiology

## 2014-12-14 ENCOUNTER — Ambulatory Visit: Payer: PRIVATE HEALTH INSURANCE | Admitting: Neurology

## 2014-12-24 ENCOUNTER — Other Ambulatory Visit: Payer: Self-pay | Admitting: Family Medicine

## 2014-12-30 ENCOUNTER — Encounter: Payer: Self-pay | Admitting: Family Medicine

## 2014-12-30 ENCOUNTER — Ambulatory Visit (INDEPENDENT_AMBULATORY_CARE_PROVIDER_SITE_OTHER): Payer: Medicare Other | Admitting: Family Medicine

## 2014-12-30 VITALS — BP 162/82 | HR 82 | Temp 97.8°F | Wt 172.0 lb

## 2014-12-30 DIAGNOSIS — E785 Hyperlipidemia, unspecified: Secondary | ICD-10-CM

## 2014-12-30 DIAGNOSIS — I25810 Atherosclerosis of coronary artery bypass graft(s) without angina pectoris: Secondary | ICD-10-CM

## 2014-12-30 DIAGNOSIS — Z79899 Other long term (current) drug therapy: Secondary | ICD-10-CM

## 2014-12-30 DIAGNOSIS — Z01818 Encounter for other preprocedural examination: Secondary | ICD-10-CM | POA: Diagnosis not present

## 2014-12-30 DIAGNOSIS — N183 Chronic kidney disease, stage 3 unspecified: Secondary | ICD-10-CM

## 2014-12-30 DIAGNOSIS — I5032 Chronic diastolic (congestive) heart failure: Secondary | ICD-10-CM | POA: Diagnosis not present

## 2014-12-30 DIAGNOSIS — I10 Essential (primary) hypertension: Secondary | ICD-10-CM

## 2014-12-30 LAB — COMPREHENSIVE METABOLIC PANEL
ALBUMIN: 3.7 g/dL (ref 3.5–5.2)
ALT: 6 U/L (ref 0–53)
AST: 15 U/L (ref 0–37)
Alkaline Phosphatase: 68 U/L (ref 39–117)
BUN: 28 mg/dL — ABNORMAL HIGH (ref 6–23)
CALCIUM: 9.2 mg/dL (ref 8.4–10.5)
CHLORIDE: 104 meq/L (ref 96–112)
CO2: 32 mEq/L (ref 19–32)
Creatinine, Ser: 1.99 mg/dL — ABNORMAL HIGH (ref 0.40–1.50)
GFR: 34.23 mL/min — ABNORMAL LOW (ref >60.00)
Glucose, Bld: 111 mg/dL — ABNORMAL HIGH (ref 70–99)
POTASSIUM: 4.2 meq/L (ref 3.5–5.1)
Sodium: 143 mEq/L (ref 135–145)
Total Bilirubin: 0.4 mg/dL (ref 0.2–1.2)
Total Protein: 6.5 g/dL (ref 6.0–8.3)

## 2014-12-30 LAB — CBC
HEMATOCRIT: 37.8 % — AB (ref 39.0–52.0)
Hemoglobin: 12.4 g/dL — ABNORMAL LOW (ref 13.0–17.0)
MCHC: 32.8 g/dL (ref 30.0–36.0)
MCV: 83 fl (ref 78.0–100.0)
PLATELETS: 206 10*3/uL (ref 150.0–400.0)
RBC: 4.56 Mil/uL (ref 4.22–5.81)
RDW: 17.3 % — ABNORMAL HIGH (ref 11.5–15.5)
WBC: 8.6 10*3/uL (ref 4.0–10.5)

## 2014-12-30 LAB — PROTIME-INR
INR: 1.2 ratio — AB (ref 0.8–1.0)
Prothrombin Time: 12.8 s (ref 9.6–13.1)

## 2014-12-30 LAB — LDL CHOLESTEROL, DIRECT: LDL DIRECT: 76 mg/dL

## 2014-12-30 NOTE — Assessment & Plan Note (Addendum)
S: poorly controlled. On imdur 60mg  and lasix 60mg . Patient claims this is white coat hypertension BP Readings from Last 3 Encounters:  12/30/14 162/82  11/04/14 131/75  09/28/14 154/92  A/P: Based on current blood pressure and CAD history, prefer patient not move forward with surgery unless we can prove this is white coat. He is going to monitor at home and bring readings along with his home cuff so we can verify. He does not want to take additional medications. I think lisinopril would be a good choice with CKD but would need repeat BMET as he is at risk for renal artery stenosis within 2 weeks of start. Bradycardia on coreg in past. Amlodipine would like to avoid if possible with HF history though not contraindicated.

## 2014-12-30 NOTE — Patient Instructions (Addendum)
Get a home blood pressure cuff. Check pressure daily. Goal <140/90. Bring your log and your cuff to next visit. See me in about 2 weeks.   Get bloodwork for having teeth pulled today. I cannot find a code for the 2nd set of coags that works so your oral surgeon would have to find one if he wants your PTT tested in addition to PT/INR.

## 2014-12-30 NOTE — Assessment & Plan Note (Signed)
S: controlled. compliant with Lasix 60mg . Weight down 8 lbs since cards visit. LIkely NYHA II A/P:Continue current meds:  Lasix 60mg . Has upcoming cards visit this month as well.

## 2014-12-30 NOTE — Assessment & Plan Note (Signed)
S: Asymptomatic. Difficult to tell MET level as does not climb stairs. Does suggest can climb a reasonable hill without shortness of breath. No nitroglycerin used in years but has been on imdur.  A/P: Patient brings a form requesting my clearance for him for oral surgery (removal of remaining teeth reportedly). There is no notes on level of sedation. I told patient this clearance should come from cards who he sees later this month He has stopped taking aspirin for some reason and I reiterated importance of taking this with CAD history.   Oral surgeons also requested CBC, metabolic panel and coags. I ordered CBC, CMET, PT/INR. None of patients diagnosis covered PTT so I will need a specific code if surgeon wants this level.

## 2014-12-30 NOTE — Assessment & Plan Note (Signed)
S: previously controlled. Last labs >1 year ago.  A/P:Continue current meds:  Simvastatin 20mg . Check direct LDL and goal <70 with CAD history.

## 2014-12-30 NOTE — Assessment & Plan Note (Signed)
S: CKD III with GFR 30-40. Stable A/P: check BMET today. Needs much improved blood pressure control to prevent progression.

## 2014-12-30 NOTE — Progress Notes (Signed)
Garret Reddish, MD  Subjective:  Harold SCHLAFER is a 81 y.o. year old very pleasant male patient who presents for/with See problem oriented charting ROS- No chest pain, shortness of breath, nausea, vomiting, headache, does have known tremor. No recent hot swollen joints.   Past Medical History-  Patient Active Problem List   Diagnosis Date Noted  . Parkinson's disease 09/28/2014    Priority: High  . CKD stage III. GFR 30-40 10/04/2013    Priority: High  . CAD (coronary artery disease)     Priority: High  . DIASTOLIC HEART FAILURE, CHRONIC 09/29/2008    Priority: High  . MACULAR DEGENERATION 10/03/2007    Priority: High  . Depression 11/19/2013    Priority: Medium  . Risk for falls 11/19/2013    Priority: Medium  . OSA on CPAP 10/04/2013    Priority: Medium  . Hyperlipidemia 06/05/2007    Priority: Medium  . Gout 10/28/2006    Priority: Medium  . Essential hypertension 10/28/2006    Priority: Medium  . Abdominal aortic aneurysm 10/28/2006    Priority: Medium  . Osteoarthritis 09/28/2014    Priority: Low  . Protein-calorie malnutrition, severe 09/28/2013    Priority: Low  . Peripheral vascular disease 10/28/2006    Medications- reviewed and updated Current Outpatient Prescriptions  Medication Sig Dispense Refill  . aspirin 81 MG tablet Take 81 mg by mouth daily.    Marland Kitchen buPROPion (WELLBUTRIN XL) 150 MG 24 hr tablet TAKE ONE TABLET BY MOUTH TWICE DAILY 60 tablet 5  . docusate sodium 100 MG CAPS Take 100 mg by mouth 2 (two) times daily. 30 capsule 0  . fluticasone (FLONASE) 50 MCG/ACT nasal spray USE TWO SPRAYS IN EACH NOSTRIL EVERY DAY 16 g 6  . furosemide (LASIX) 40 MG tablet TAKE 1 TABLET BY MOUTH 2 TIMES DAILY 60 tablet 3  . isosorbide mononitrate (IMDUR) 60 MG 24 hr tablet TAKE 1 TABLET BY MOUTH EVERY DAY 30 tablet 0  . nitroGLYCERIN (NITROSTAT) 0.4 MG SL tablet Place 0.4 mg under the tongue every 5 (five) minutes as needed for chest pain.    . polyethylene glycol  powder (GLYCOLAX/MIRALAX) powder Take 0.5 Containers by mouth daily.     . potassium chloride (K-DUR) 10 MEQ tablet TAKE TWO TABLETS BY MOUTH EVERY DAY 60 tablet 6  . simvastatin (ZOCOR) 20 MG tablet TAKE 1 TABLET BY MOUTH EVERY DAY 30 tablet 0   No current facility-administered medications for this visit.    Objective: BP 162/82 mmHg  Pulse 82  Temp(Src) 97.8 F (36.6 C)  Wt 172 lb (78.019 kg) Gen: NAD, resting comfortably CV: RRR no murmurs rubs or gallops Lungs: CTAB no crackles, wheeze, rhonchi Abdomen: soft/nontender/nondistended/normal bowel sounds. No rebound or guarding.  Ext: trace edema Skin: warm, dry Neuro: grossly normal, moves all extremities  Assessment/Plan:  DIASTOLIC HEART FAILURE, CHRONIC S: controlled. compliant with Lasix 3m. Weight down 8 lbs since cards visit. LIkely NYHA II A/P:Continue current meds:  Lasix 686m Has upcoming cards visit this month as well.    CKD stage III. GFR 30-40 S: CKD III with GFR 30-40. Stable A/P: check BMET today. Needs much improved blood pressure control to prevent progression.    Hyperlipidemia S: previously controlled. Last labs >1 year ago.  A/P:Continue current meds:  Simvastatin 2050mCheck direct LDL and goal <70 with CAD history.    Essential hypertension S: poorly controlled. On imdur 26m31md lasix 26mg78mtient claims this is white coat hypertension BP Readings  from Last 3 Encounters:  12/30/14 162/82  11/04/14 131/75  09/28/14 154/92  A/P: Based on current blood pressure and CAD history, prefer patient not move forward with surgery unless we can prove this is white coat. He is going to monitor at home and bring readings along with his home cuff so we can verify. He does not want to take additional medications. I think lisinopril would be a good choice with CKD but would need repeat BMET as he is at risk for renal artery stenosis within 2 weeks of start. Bradycardia on coreg in past. Amlodipine would like to  avoid if possible with HF history though not contraindicated.     CAD (coronary artery disease) S: Asymptomatic. Difficult to tell MET level as does not climb stairs. Does suggest can climb a reasonable hill without shortness of breath. No nitroglycerin used in years but has been on imdur.  A/P: Patient brings a form requesting my clearance for him for oral surgery (removal of remaining teeth reportedly). There is no notes on level of sedation. I told patient this clearance should come from cards who he sees later this month He has stopped taking aspirin for some reason and I reiterated importance of taking this with CAD history.   Oral surgeons also requested CBC, metabolic panel and coags. I ordered CBC, CMET, PT/INR. None of patients diagnosis covered PTT so I will need a specific code if surgeon wants this level.    2 week BP recheck with home monitoring  Orders Placed This Encounter  Procedures  . CBC    [facility]  . Comprehensive metabolic panel    [place]  . LDL cholesterol, direct      . Protime-INR    Meds ordered this encounter  Medications  . aspirin 81 MG tablet    Sig: Take 81 mg by mouth daily.

## 2015-01-11 ENCOUNTER — Other Ambulatory Visit: Payer: Self-pay | Admitting: Cardiology

## 2015-01-13 ENCOUNTER — Other Ambulatory Visit: Payer: Self-pay | Admitting: Cardiology

## 2015-01-13 NOTE — Telephone Encounter (Signed)
[name], PA-C at 01/19/2014 11:27 AM  isosorbide mononitrate (IMDUR) 60 MG 24 hr tablet Take 1 tablet (60 mg total) by mouth daily  Pharmacy called and I gave verbally Imdur 60 Mg 15 tabs 0 refills because pt has made upcoming office visit

## 2015-01-14 ENCOUNTER — Ambulatory Visit: Payer: PRIVATE HEALTH INSURANCE | Admitting: Family Medicine

## 2015-01-24 ENCOUNTER — Ambulatory Visit (INDEPENDENT_AMBULATORY_CARE_PROVIDER_SITE_OTHER): Payer: Medicare Other | Admitting: Cardiology

## 2015-01-24 ENCOUNTER — Encounter: Payer: Self-pay | Admitting: Cardiology

## 2015-01-24 VITALS — BP 170/82 | HR 64 | Ht 71.0 in | Wt 172.8 lb

## 2015-01-24 DIAGNOSIS — I2510 Atherosclerotic heart disease of native coronary artery without angina pectoris: Secondary | ICD-10-CM

## 2015-01-24 DIAGNOSIS — I25810 Atherosclerosis of coronary artery bypass graft(s) without angina pectoris: Secondary | ICD-10-CM | POA: Diagnosis not present

## 2015-01-24 MED ORDER — ISOSORBIDE MONONITRATE ER 60 MG PO TB24: 60.0000 mg | ORAL_TABLET | Freq: Every day | ORAL | Status: DC

## 2015-01-24 MED ORDER — SIMVASTATIN 20 MG PO TABS: 20.0000 mg | ORAL_TABLET | Freq: Every day | ORAL | Status: AC

## 2015-01-24 MED ORDER — POTASSIUM CHLORIDE ER 10 MEQ PO TBCR: 20.0000 meq | EXTENDED_RELEASE_TABLET | Freq: Every day | ORAL | Status: AC

## 2015-01-24 MED ORDER — FUROSEMIDE 40 MG PO TABS: 40.0000 mg | ORAL_TABLET | Freq: Every day | ORAL | Status: AC

## 2015-01-24 NOTE — Progress Notes (Signed)
01/24/2015 Harold [name]   [birth year]  [id number]  Primary Physician Garret Reddish, MD Primary Cardiologist: Dr. Aundra Dubin   Reason for Visit/CC: F/u for CAD and Diastolic CHF/ Medication Refills  HPI:  The patient is a 81 year old male, followed by Dr. Aundra Dubin, who presents to clinic today for routine f/u and for medication refills.   His past cardiac history is significant for CAD, s/p CABG in 1998 and multiple PCI's, chronic diastolic CHF, macular degeneration (legally blind), sleep apnea, prior AAA repair, HTN, CKD and PVD. His most recent left heart cath in 2010 revealed mid left main 70%, LAD occluded, circumflex occluded, RCA occluded, LIMA-LAD patent with 40% within the stent at the anastomosis, SVG-diagonal occluded, SVG-circumflex occluded, SVG-RCA occluded. Medical therapy was elected. His most recent echocardiogram was in May 2015 revealing normal left ventricular systolic function with an ejection fraction of 60-65%. Grade 2 diastolic dysfunction and moderate mitral regurgitation was also noted. He also has a history of bilateral LE peripheral vascular disease which is followed by Dr. Gwenlyn Found. He has also had issues with both hypotension and bradycardia which led to discontinuation of his amlodipine, losartan and carvedilol. He does admit to h/o "white coat syndrome" and his pressure is a bit high today in the low 462V systolic. However, he keeps a regular check of pressure at home and systolic BPs are typically stable in the 130s-140s.   He reports that since his last OV, he has done well. He denies any chest pain. He is limited some, physically, by his macular degeneration, but denies any anginal symptoms at rest or with basic day-to-day activities. He also denies dyspnea, orthopnea, PND, LEE, palpitations, dizziness, syncope/ near syncope. He reports full medication compliance.  He also notes that at one point, he was contemplating oral surgery for multiple tooth extractions but he  has decided not to have surgery.    Current Outpatient Prescriptions  Medication Sig Dispense Refill  . amoxicillin (AMOXIL) 500 MG capsule Take four (4) capsules by mouth one prior to dental appointments  1  . aspirin 81 MG tablet Take 81 mg by mouth daily.    Marland Kitchen buPROPion (WELLBUTRIN XL) 150 MG 24 hr tablet TAKE ONE TABLET BY MOUTH TWICE DAILY 60 tablet 5  . docusate sodium (COLACE) 100 MG capsule Take 100 mg by mouth at bedtime.    . fluticasone (FLONASE) 50 MCG/ACT nasal spray USE TWO SPRAYS IN EACH NOSTRIL EVERY DAY 16 g 6  . furosemide (LASIX) 40 MG tablet Take 1 tablet (40 mg total) by mouth daily. 30 tablet 11  . isosorbide mononitrate (IMDUR) 60 MG 24 hr tablet Take 1 tablet (60 mg total) by mouth daily. 30 tablet 11  . nitroGLYCERIN (NITROSTAT) 0.4 MG SL tablet Place 0.4 mg under the tongue every 5 (five) minutes as needed for chest pain.    . polyethylene glycol powder (GLYCOLAX/MIRALAX) powder Take 0.5 Containers by mouth daily as needed for mild constipation.     . potassium chloride (K-DUR) 10 MEQ tablet Take 2 tablets (20 mEq total) by mouth daily. 60 tablet 11  . simvastatin (ZOCOR) 20 MG tablet Take 1 tablet (20 mg total) by mouth daily. 30 tablet 11   No current facility-administered medications for this visit.    No Known Allergies  Social History   Social History  . Marital Status: Married    Spouse Name: N/A  . Number of Children: N/A  . Years of Education: N/A   Occupational History  . Owner of  used car lot     Retired   Social History Main Topics  . Smoking status: Former Smoker -- 2.00 packs/day for 52 years    Quit date: 04/30/1988  . Smokeless tobacco: Never Used     Comment: smoked x52 years; up to 2 ppd   . Alcohol Use: 0.0 oz/week    0 Standard drinks or equivalent per week     Comment: 2-3 drink a day  . Drug Use: No  . Sexual Activity: Not Currently   Other Topics Concern  . Not on file   Social History Narrative   Retired used [employer] officer     Does not get regular exercise   Married with children   Wife has dementia and was recently taken to [facility] to move into assisted living by her dtr, against pts wishes.     Review of Systems: General: negative for chills, fever, night sweats or weight changes.  Cardiovascular: negative for chest pain, dyspnea on exertion, edema, orthopnea, palpitations, paroxysmal nocturnal dyspnea or shortness of breath Dermatological: negative for rash Respiratory: negative for cough or wheezing Urologic: negative for hematuria Abdominal: negative for nausea, vomiting, diarrhea, bright red blood per rectum, melena, or hematemesis Neurologic: negative for visual changes, syncope, or dizziness All other systems reviewed and are otherwise negative except as noted above.    Blood pressure 170/82, pulse 64, height 5\' 11"  (1.803 m), weight 172 lb 12.8 oz (78.382 kg).  General appearance: alert, cooperative and no distress Neck: no carotid bruit and no JVD Lungs: clear to auscultation bilaterally Heart: regular rate and rhythm, S1, S2 normal, no murmur, click, rub or gallop Extremities: no LEE Pulses: 2+ and symmetric Skin: Skin color, texture, turgor normal. No rashes or lesions Neurologic: Grossly normal  EKG NSR. No ischemia.   ASSESSMENT AND PLAN:   No problem-specific assessment & plan notes found for this encounter.   1. CAD: h/o CABG in 1998. Stable w/o any angina and no new EKG changes. Continue medical therapy with ASA, Imdur and statin. No BB due to h/o bradycardia.   2. Chronic Diastolic CHF: stable w/o dyspnea, orthopnea/PND. He is euvolemic on physical exam. Continue daily lasix.   3. AAA: s/p repair by Dr. Donnetta Hutching in 2002.   4. HTN: a bit high today due to patient report of "WCS". He monitors BP closely at home and is decently controlled in the 130s-140s. Will continue with only Lasix and Imdur given his past h/o hypotension on multiple agents.   5. PVD: followed by Dr. Gwenlyn Found. Stable  with medical therapy.   6. CKD: Baseline Scr 1.9-2.2  PLAN  Patient appears to be stable from a cardiac standpoint. Will refill cardiac meds with 6 month supply. F/u with Dr. Aundra Dubin in 6 months.   Lyda Jester PA-C 01/24/2015 2:13 PM

## 2015-01-24 NOTE — Patient Instructions (Addendum)
Medication Instructions:  Your physician recommends that you continue on your current medications as directed. Please refer to the Current Medication list given to you today.   Labwork: none  Testing/Procedures: none  Follow-Up: Your physician wants you to follow-up in:  6 months with Dr. Aundra Dubin. You will receive a reminder letter in the mail two months in advance. If you don't receive a letter, please call our office to schedule the follow-up appointment.   Any Other Special Instructions Will Be Listed Below (If Applicable).

## 2015-02-10 ENCOUNTER — Ambulatory Visit: Payer: Medicare Other | Admitting: Podiatry

## 2015-03-04 ENCOUNTER — Emergency Department (HOSPITAL_COMMUNITY): Payer: Medicare Other

## 2015-03-04 ENCOUNTER — Inpatient Hospital Stay (HOSPITAL_COMMUNITY)
Admission: EM | Admit: 2015-03-04 | Discharge: 2015-03-14 | DRG: 871 | Disposition: A | Discharge status: Skilled Nursing Facility | Payer: Medicare Other | Attending: Internal Medicine | Admitting: Internal Medicine

## 2015-03-04 ENCOUNTER — Encounter (HOSPITAL_COMMUNITY): Payer: Self-pay | Admitting: Emergency Medicine

## 2015-03-04 DIAGNOSIS — J9621 Acute and chronic respiratory failure with hypoxia: Secondary | ICD-10-CM | POA: Diagnosis present

## 2015-03-04 DIAGNOSIS — J9622 Acute and chronic respiratory failure with hypercapnia: Secondary | ICD-10-CM | POA: Diagnosis present

## 2015-03-04 DIAGNOSIS — Z825 Family history of asthma and other chronic lower respiratory diseases: Secondary | ICD-10-CM | POA: Diagnosis not present

## 2015-03-04 DIAGNOSIS — I248 Other forms of acute ischemic heart disease: Secondary | ICD-10-CM

## 2015-03-04 DIAGNOSIS — A419 Sepsis, unspecified organism: Secondary | ICD-10-CM | POA: Diagnosis present

## 2015-03-04 DIAGNOSIS — Z8249 Family history of ischemic heart disease and other diseases of the circulatory system: Secondary | ICD-10-CM

## 2015-03-04 DIAGNOSIS — I129 Hypertensive chronic kidney disease with stage 1 through stage 4 chronic kidney disease, or unspecified chronic kidney disease: Secondary | ICD-10-CM | POA: Diagnosis present

## 2015-03-04 DIAGNOSIS — R0902 Hypoxemia: Secondary | ICD-10-CM

## 2015-03-04 DIAGNOSIS — B952 Enterococcus as the cause of diseases classified elsewhere: Secondary | ICD-10-CM | POA: Diagnosis present

## 2015-03-04 DIAGNOSIS — J9620 Acute and chronic respiratory failure, unspecified whether with hypoxia or hypercapnia: Secondary | ICD-10-CM | POA: Diagnosis not present

## 2015-03-04 DIAGNOSIS — Z87891 Personal history of nicotine dependence: Secondary | ICD-10-CM | POA: Diagnosis not present

## 2015-03-04 DIAGNOSIS — N183 Chronic kidney disease, stage 3 unspecified: Secondary | ICD-10-CM | POA: Diagnosis present

## 2015-03-04 DIAGNOSIS — I10 Essential (primary) hypertension: Secondary | ICD-10-CM | POA: Diagnosis not present

## 2015-03-04 DIAGNOSIS — Z955 Presence of coronary angioplasty implant and graft: Secondary | ICD-10-CM | POA: Diagnosis not present

## 2015-03-04 DIAGNOSIS — R1310 Dysphagia, unspecified: Secondary | ICD-10-CM | POA: Diagnosis present

## 2015-03-04 DIAGNOSIS — I214 Non-ST elevation (NSTEMI) myocardial infarction: Secondary | ICD-10-CM | POA: Diagnosis present

## 2015-03-04 DIAGNOSIS — E872 Acidosis: Secondary | ICD-10-CM | POA: Diagnosis present

## 2015-03-04 DIAGNOSIS — R0602 Shortness of breath: Secondary | ICD-10-CM | POA: Diagnosis present

## 2015-03-04 DIAGNOSIS — I5033 Acute on chronic diastolic (congestive) heart failure: Secondary | ICD-10-CM | POA: Diagnosis present

## 2015-03-04 DIAGNOSIS — J189 Pneumonia, unspecified organism: Secondary | ICD-10-CM | POA: Diagnosis present

## 2015-03-04 DIAGNOSIS — R001 Bradycardia, unspecified: Secondary | ICD-10-CM | POA: Diagnosis present

## 2015-03-04 DIAGNOSIS — I2510 Atherosclerotic heart disease of native coronary artery without angina pectoris: Secondary | ICD-10-CM

## 2015-03-04 DIAGNOSIS — Z9889 Other specified postprocedural states: Secondary | ICD-10-CM

## 2015-03-04 DIAGNOSIS — Z79899 Other long term (current) drug therapy: Secondary | ICD-10-CM | POA: Diagnosis not present

## 2015-03-04 DIAGNOSIS — Z87442 Personal history of urinary calculi: Secondary | ICD-10-CM | POA: Diagnosis not present

## 2015-03-04 DIAGNOSIS — G20A1 Parkinson's disease without dyskinesia, without mention of fluctuations: Secondary | ICD-10-CM | POA: Diagnosis present

## 2015-03-04 DIAGNOSIS — R413 Other amnesia: Secondary | ICD-10-CM | POA: Diagnosis present

## 2015-03-04 DIAGNOSIS — Z791 Long term (current) use of non-steroidal anti-inflammatories (NSAID): Secondary | ICD-10-CM

## 2015-03-04 DIAGNOSIS — H3530 Unspecified macular degeneration: Secondary | ICD-10-CM | POA: Diagnosis present

## 2015-03-04 DIAGNOSIS — D6489 Other specified anemias: Secondary | ICD-10-CM | POA: Diagnosis present

## 2015-03-04 DIAGNOSIS — Z85828 Personal history of other malignant neoplasm of skin: Secondary | ICD-10-CM

## 2015-03-04 DIAGNOSIS — Z951 Presence of aortocoronary bypass graft: Secondary | ICD-10-CM | POA: Diagnosis not present

## 2015-03-04 DIAGNOSIS — G20 Parkinson's disease: Secondary | ICD-10-CM | POA: Diagnosis present

## 2015-03-04 DIAGNOSIS — I340 Nonrheumatic mitral (valve) insufficiency: Secondary | ICD-10-CM | POA: Diagnosis present

## 2015-03-04 DIAGNOSIS — E876 Hypokalemia: Secondary | ICD-10-CM | POA: Diagnosis present

## 2015-03-04 DIAGNOSIS — I714 Abdominal aortic aneurysm, without rupture: Secondary | ICD-10-CM | POA: Diagnosis present

## 2015-03-04 DIAGNOSIS — F329 Major depressive disorder, single episode, unspecified: Secondary | ICD-10-CM | POA: Diagnosis present

## 2015-03-04 DIAGNOSIS — M109 Gout, unspecified: Secondary | ICD-10-CM | POA: Diagnosis present

## 2015-03-04 DIAGNOSIS — R7989 Other specified abnormal findings of blood chemistry: Secondary | ICD-10-CM | POA: Diagnosis present

## 2015-03-04 DIAGNOSIS — I739 Peripheral vascular disease, unspecified: Secondary | ICD-10-CM | POA: Diagnosis present

## 2015-03-04 DIAGNOSIS — G4733 Obstructive sleep apnea (adult) (pediatric): Secondary | ICD-10-CM | POA: Diagnosis present

## 2015-03-04 DIAGNOSIS — N179 Acute kidney failure, unspecified: Secondary | ICD-10-CM | POA: Diagnosis present

## 2015-03-04 DIAGNOSIS — Z4659 Encounter for fitting and adjustment of other gastrointestinal appliance and device: Secondary | ICD-10-CM

## 2015-03-04 DIAGNOSIS — Z7982 Long term (current) use of aspirin: Secondary | ICD-10-CM | POA: Diagnosis not present

## 2015-03-04 DIAGNOSIS — R778 Other specified abnormalities of plasma proteins: Secondary | ICD-10-CM | POA: Diagnosis present

## 2015-03-04 DIAGNOSIS — N184 Chronic kidney disease, stage 4 (severe): Secondary | ICD-10-CM | POA: Diagnosis present

## 2015-03-04 DIAGNOSIS — Z978 Presence of other specified devices: Secondary | ICD-10-CM

## 2015-03-04 DIAGNOSIS — I5032 Chronic diastolic (congestive) heart failure: Secondary | ICD-10-CM | POA: Diagnosis present

## 2015-03-04 DIAGNOSIS — J9601 Acute respiratory failure with hypoxia: Secondary | ICD-10-CM | POA: Insufficient documentation

## 2015-03-04 DIAGNOSIS — N390 Urinary tract infection, site not specified: Secondary | ICD-10-CM | POA: Diagnosis present

## 2015-03-04 DIAGNOSIS — Z8679 Personal history of other diseases of the circulatory system: Secondary | ICD-10-CM

## 2015-03-04 DIAGNOSIS — E785 Hyperlipidemia, unspecified: Secondary | ICD-10-CM | POA: Diagnosis present

## 2015-03-04 LAB — TROPONIN I
TROPONIN I: 3.81 ng/mL — AB (ref <0.031)
TROPONIN I: 6.75 ng/mL — AB (ref <0.031)

## 2015-03-04 LAB — CBC WITH DIFFERENTIAL/PLATELET
Basophils Absolute: 0 10*3/uL (ref 0.0–0.1)
Basophils Relative: 0 %
Eosinophils Absolute: 0 10*3/uL (ref 0.0–0.7)
Eosinophils Relative: 0 %
HCT: 38.8 % — ABNORMAL LOW (ref 39.0–52.0)
Hemoglobin: 12.5 g/dL — ABNORMAL LOW (ref 13.0–17.0)
Lymphocytes Relative: 3 %
Lymphs Abs: 0.3 10*3/uL — ABNORMAL LOW (ref 0.7–4.0)
MCH: 27.3 pg (ref 26.0–34.0)
MCHC: 32.2 g/dL (ref 30.0–36.0)
MCV: 84.7 fL (ref 78.0–100.0)
Monocytes Absolute: 0.9 10*3/uL (ref 0.1–1.0)
Monocytes Relative: 9 %
Neutro Abs: 9.3 10*3/uL — ABNORMAL HIGH (ref 1.7–7.7)
Neutrophils Relative %: 88 %
Platelets: 176 10*3/uL (ref 150–400)
RBC: 4.58 MIL/uL (ref 4.22–5.81)
RDW: 16.3 % — ABNORMAL HIGH (ref 11.5–15.5)
WBC: 10.5 10*3/uL (ref 4.0–10.5)

## 2015-03-04 LAB — I-STAT CG4 LACTIC ACID, ED
Lactic Acid, Venous: 1.83 mmol/L (ref 0.5–2.0)
Lactic Acid, Venous: 2.06 mmol/L (ref 0.5–2.0)

## 2015-03-04 LAB — COMPREHENSIVE METABOLIC PANEL
ALT: 8 U/L — ABNORMAL LOW (ref 17–63)
AST: 21 U/L (ref 15–41)
Albumin: 3.2 g/dL — ABNORMAL LOW (ref 3.5–5.0)
Alkaline Phosphatase: 64 U/L (ref 38–126)
Anion gap: 9 (ref 5–15)
BUN: 31 mg/dL — ABNORMAL HIGH (ref 6–20)
CO2: 25 mmol/L (ref 22–32)
Calcium: 8.7 mg/dL — ABNORMAL LOW (ref 8.9–10.3)
Chloride: 106 mmol/L (ref 101–111)
Creatinine, Ser: 2 mg/dL — ABNORMAL HIGH (ref 0.61–1.24)
GFR calc Af Amer: 34 mL/min — ABNORMAL LOW (ref >60)
GFR calc non Af Amer: 29 mL/min — ABNORMAL LOW (ref >60)
Glucose, Bld: 94 mg/dL (ref 65–99)
Potassium: 3.9 mmol/L (ref 3.5–5.1)
Sodium: 140 mmol/L (ref 135–145)
Total Bilirubin: 1.1 mg/dL (ref 0.3–1.2)
Total Protein: 5.9 g/dL — ABNORMAL LOW (ref 6.5–8.1)

## 2015-03-04 LAB — URINALYSIS, ROUTINE W REFLEX MICROSCOPIC
BILIRUBIN URINE: NEGATIVE
Glucose, UA: NEGATIVE mg/dL
Ketones, ur: NEGATIVE mg/dL
NITRITE: NEGATIVE
Protein, ur: 100 mg/dL — AB
Specific Gravity, Urine: 1.012 (ref 1.005–1.030)
UROBILINOGEN UA: 0.2 mg/dL (ref 0.0–1.0)
pH: 6.5 (ref 5.0–8.0)

## 2015-03-04 LAB — PROTIME-INR
INR: 1.32 (ref 0.00–1.49)
PROTHROMBIN TIME: 16.5 s — AB (ref 11.6–15.2)

## 2015-03-04 LAB — URINE MICROSCOPIC-ADD ON

## 2015-03-04 LAB — APTT: APTT: 36 s (ref 24–37)

## 2015-03-04 LAB — I-STAT TROPONIN, ED: TROPONIN I, POC: 0.54 ng/mL — AB (ref 0.00–0.08)

## 2015-03-04 LAB — STREP PNEUMONIAE URINARY ANTIGEN: STREP PNEUMO URINARY ANTIGEN: NEGATIVE

## 2015-03-04 MED ORDER — HEPARIN BOLUS VIA INFUSION
4000.0000 [IU] | Freq: Once | INTRAVENOUS | Status: AC
  Administered 2015-03-04: 4000 [IU] via INTRAVENOUS
  Filled 2015-03-04: qty 4000

## 2015-03-04 MED ORDER — POLYETHYLENE GLYCOL 3350 17 G PO PACK: 8.5000 g | PACK | Freq: Every day | ORAL | Status: DC | PRN

## 2015-03-04 MED ORDER — HEPARIN (PORCINE) IN NACL 100-0.45 UNIT/ML-% IJ SOLN
12.0000 [IU]/kg/h | INTRAMUSCULAR | Status: DC
  Administered 2015-03-04: 12 [IU]/kg/h via INTRAVENOUS
  Filled 2015-03-04: qty 250

## 2015-03-04 MED ORDER — ACETAMINOPHEN 325 MG PO TABS
650.0000 mg | ORAL_TABLET | Freq: Four times a day (QID) | ORAL | Status: DC | PRN
Start: 2015-03-04 — End: 2015-03-06

## 2015-03-04 MED ORDER — DEXTROSE 5 % IV SOLN
500.0000 mg | Freq: Once | INTRAVENOUS | Status: AC
  Administered 2015-03-04: 500 mg via INTRAVENOUS
  Filled 2015-03-04: qty 500

## 2015-03-04 MED ORDER — ISOSORBIDE MONONITRATE ER 60 MG PO TB24
60.0000 mg | ORAL_TABLET | Freq: Every day | ORAL | Status: DC
  Administered 2015-03-04 – 2015-03-06 (×3): 60 mg via ORAL
  Filled 2015-03-04 (×3): qty 1

## 2015-03-04 MED ORDER — BUPROPION HCL ER (XL) 150 MG PO TB24
150.0000 mg | ORAL_TABLET | Freq: Two times a day (BID) | ORAL | Status: DC
  Administered 2015-03-04 – 2015-03-06 (×4): 150 mg via ORAL
  Filled 2015-03-04 (×5): qty 1

## 2015-03-04 MED ORDER — METOPROLOL TARTRATE 25 MG PO TABS: 12.5000 mg | ORAL_TABLET | Freq: Two times a day (BID) | ORAL | Status: DC

## 2015-03-04 MED ORDER — DEXTROSE 5 % IV SOLN: 1.0000 g | INTRAVENOUS | Status: DC

## 2015-03-04 MED ORDER — ASPIRIN EC 325 MG PO TBEC
325.0000 mg | DELAYED_RELEASE_TABLET | Freq: Every day | ORAL | Status: DC
  Administered 2015-03-04 – 2015-03-07 (×4): 325 mg via ORAL
  Filled 2015-03-04 (×4): qty 1

## 2015-03-04 MED ORDER — DEXTROSE 5 % IV SOLN
500.0000 mg | INTRAVENOUS | Status: DC
  Administered 2015-03-05: 500 mg via INTRAVENOUS
  Filled 2015-03-04 (×2): qty 500

## 2015-03-04 MED ORDER — DEXTROSE 5 % IV SOLN: 500.0000 mg | INTRAVENOUS | Status: DC

## 2015-03-04 MED ORDER — ACETAMINOPHEN 325 MG PO TABS
650.0000 mg | ORAL_TABLET | Freq: Once | ORAL | Status: AC | PRN
  Administered 2015-03-04: 650 mg via ORAL
  Filled 2015-03-04: qty 2

## 2015-03-04 MED ORDER — DEXTROSE 5 % IV SOLN
1.0000 g | INTRAVENOUS | Status: DC
  Administered 2015-03-05: 1 g via INTRAVENOUS
  Filled 2015-03-04 (×2): qty 10

## 2015-03-04 MED ORDER — DEXTROSE 5 % IV SOLN
1.0000 g | Freq: Once | INTRAVENOUS | Status: AC
  Administered 2015-03-04: 1 g via INTRAVENOUS
  Filled 2015-03-04: qty 10

## 2015-03-04 MED ORDER — ONDANSETRON HCL 4 MG/2ML IJ SOLN: 4.0000 mg | Freq: Four times a day (QID) | INTRAMUSCULAR | Status: DC | PRN

## 2015-03-04 MED ORDER — ASPIRIN 81 MG PO CHEW
324.0000 mg | CHEWABLE_TABLET | Freq: Once | ORAL | Status: AC
  Administered 2015-03-04: 324 mg via ORAL
  Filled 2015-03-04: qty 4

## 2015-03-04 MED ORDER — SIMVASTATIN 20 MG PO TABS
20.0000 mg | ORAL_TABLET | Freq: Every day | ORAL | Status: DC
Start: 2015-03-04 — End: 2015-03-06
  Administered 2015-03-04 – 2015-03-06 (×3): 20 mg via ORAL
  Filled 2015-03-04 (×3): qty 1

## 2015-03-04 MED ORDER — NITROGLYCERIN 0.4 MG SL SUBL: 0.4000 mg | SUBLINGUAL_TABLET | SUBLINGUAL | Status: DC | PRN

## 2015-03-04 MED ORDER — ENOXAPARIN SODIUM 30 MG/0.3ML ~~LOC~~ SOLN
30.0000 mg | SUBCUTANEOUS | Status: DC
  Filled 2015-03-04: qty 0.3

## 2015-03-04 MED ORDER — FLUTICASONE PROPIONATE 50 MCG/ACT NA SUSP
2.0000 | Freq: Every day | NASAL | Status: DC
  Administered 2015-03-05 – 2015-03-06 (×2): 2 via NASAL
  Filled 2015-03-04 (×2): qty 16

## 2015-03-04 MED ORDER — ALBUTEROL SULFATE (2.5 MG/3ML) 0.083% IN NEBU
2.5000 mg | INHALATION_SOLUTION | RESPIRATORY_TRACT | Status: DC | PRN
  Administered 2015-03-06: 2.5 mg via RESPIRATORY_TRACT
  Filled 2015-03-04: qty 3

## 2015-03-04 NOTE — ED Notes (Signed)
Per EMS pt from home with caretaker; complaint of SOB for a few days; sent from evaluation for possible pneumonia; given pneumonia shot last week.

## 2015-03-04 NOTE — ED Notes (Signed)
[facility] aware of pt DG results.

## 2015-03-04 NOTE — ED Notes (Signed)
[facility] at bedside. 

## 2015-03-04 NOTE — H&P (Addendum)
[facility] History and Physical  TOWNSEND CUDWORTH HFW:[id number] DOB: Jan 26, 1929 DOA: 03/04/2015  Referring physician:EDP PCP: Garret Reddish, MD   Chief Complaint:shortness of breath  HPI: Harold [name] is a 81 y.o. male the past medical history of CAD, CABG, chronic diastolic CHF, heavy former smoker, CKD 3, Parkinson's disease, memory loss, macular degeneration presents with the above complaints. Patient reports productive cough with thick yellow phlegm and progressive shortness of breath for the last 2 weeks. He is a poor historian, lives in alone and has a caregiver during the daytime. He denies any fevers or chills but was found to be febrile with a temperature of 102 in the emergency room today. In the emergency room he was found to be hypoxic, tachycardic with a temperature of 102.4, chest x-ray with extensive right middle and lower lobe pneumonia, troponin of 0.54 and EKG with T-wave inversions in inferior leads  Review of Systems: positives bolded Constitutional:  No weight loss, night sweats, Fevers, chills, fatigue.  HEENT:  No headaches, Difficulty swallowing,Tooth/dental problems,Sore throat,  No sneezing, itching, ear ache, nasal congestion, post nasal drip,  Cardio-vascular:  No chest pain, Orthopnea, PND, swelling in lower extremities, anasarca, dizziness, palpitations  GI:  No heartburn, indigestion, abdominal pain, nausea, vomiting, diarrhea, change in bowel habits, loss of appetite  Resp:   shortness of breath with exertion or at rest.  excess mucus,  productive cough, No non-productive cough, No coughing up of blood.No change in color of mucus.No wheezing.No chest wall deformity  Skin:  no rash or lesions.  GU:  no dysuria, change in color of urine, no urgency or frequency. No flank pain.  Musculoskeletal:  No joint pain or swelling. No decreased range of motion. No back pain.  Psych:  No change in mood or affect. No depression or anxiety. No  memory loss.   Past Medical History  Diagnosis Date  . CAD (coronary artery disease)     a. no known MImultiple PCIs;  b. S/P CABG;  c. all vein grafts occluded, LIMA patent by cath 08/2008.  Marland Kitchen Gout   . Macular degeneration     followed at [facility]  . PVD (peripheral vascular disease) ([place])   . OSA (obstructive sleep apnea)     using CPAP  . Diastolic heart failure     a. 08/2013 Echo: EF 60-65%, mild LVH, Gr 2 DD, mod MR.  Marland Kitchen Hypertension   . Hyperlipidemia   . Depression     symptoms  . CKD (chronic kidney disease), stage IV ([place])   . Renal adenoma     hx  . Nephrolithiasis     hx   . Hx of skin cancer, basal cell   . AAA (abdominal aortic aneurysm) ([place]) 2002    3.2 cm, with repair  . History of tobacco use     Remote (same for alcohol use)  . Claudication [facility])     a. 08/2013 ABI's R: 0.56, L 0.60 - f/u with Dr. Gwenlyn Found scheduled.  . Bradycardia     a. 08/2013  . Hypotension     a. 08/2013  . CKD (chronic kidney disease) stage 4, GFR 15-29 ml/min (HCC) 10/04/2013  . OSA on CPAP 10/04/2013  . Lower extremity ulceration (HCC)     right medial malleolus, healed  . TESTOSTERONE DEFICIENCY 11/25/2006    Qualifier: Diagnosis of  By: Teryl Lucy RN, Darnell Level   . Solitary pulmonary nodule 04/26/2009    04/28/09 Ct- No worrisome pulmonary nodules.  Past Surgical History  Procedure Laterality Date  . Abdominal aortic aneurysm repair    . Transurethral resection of prostate    . Coronary artery bypass graft  1990    s/p multiple PCIs with occlusion of al SVGs and only patnet LIMA at cath 5/10   . Cholecystectomy    . Lumbar disc surgery      x5  . Coronary stent placement      multiple; 12/02  . Back surgery    . Prostate surgery     Social History:  reports that he quit smoking about 26 years ago. He has never used smokeless tobacco. He reports that he drinks alcohol. He reports that he does not use illicit drugs.  No Known Allergies  Family History  Problem Relation Age of  Onset  . Emphysema Father   . Heart disease Paternal Uncle     x2  . Heart attack Father   . Hypertension Father     Prior to Admission medications   Medication Sig Start Date End Date Taking? Authorizing Provider  amoxicillin (AMOXIL) 500 MG capsule Take four (4) capsules by mouth one prior to dental appointments 10/27/14  Yes Historical Provider, MD  aspirin 81 MG tablet Take 81 mg by mouth daily.   Yes Historical Provider, MD  buPROPion (WELLBUTRIN XL) 150 MG 24 hr tablet TAKE ONE TABLET BY MOUTH TWICE DAILY 10/19/14  Yes Marin Olp, MD  docusate sodium (COLACE) 100 MG capsule Take 100 mg by mouth at bedtime.   Yes Historical Provider, MD  fluticasone (FLONASE) 50 MCG/ACT nasal spray USE TWO SPRAYS IN EACH NOSTRIL EVERY DAY 12/24/14  Yes Marin Olp, MD  furosemide (LASIX) 40 MG tablet Take 1 tablet (40 mg total) by mouth daily. 01/24/15  Yes Brittainy Erie Noe, PA-C  isosorbide mononitrate (IMDUR) 60 MG 24 hr tablet Take 1 tablet (60 mg total) by mouth daily. 01/24/15  Yes Brittainy Erie Noe, PA-C  naproxen sodium (ANAPROX) 220 MG tablet Take 220-400 mg by mouth 2 (two) times daily as needed (pain).   Yes Historical Provider, MD  nitroGLYCERIN (NITROSTAT) 0.4 MG SL tablet Place 0.4 mg under the tongue every 5 (five) minutes as needed for chest pain.   Yes Historical Provider, MD  polyethylene glycol powder (GLYCOLAX/MIRALAX) powder Take 0.5 Containers by mouth daily as needed for mild constipation.  08/26/09  Yes Historical Provider, MD  potassium chloride (K-DUR) 10 MEQ tablet Take 2 tablets (20 mEq total) by mouth daily. Patient taking differently: Take 10 mEq by mouth daily.  01/24/15  Yes Brittainy Erie Noe, PA-C  simvastatin (ZOCOR) 20 MG tablet Take 1 tablet (20 mg total) by mouth daily. 01/24/15  Yes Brittainy Erie Noe, PA-C  triamcinolone cream (KENALOG) 0.1 % APPLY TO AFFECTED AREA 2 TIMES DAILY UNTIL HEALED 02/22/15  Yes Historical Provider, MD  FLUZONE HIGH-DOSE 0.5 ML  SUSY ADM 0.5ML IM UTD 02/18/15   Historical Provider, MD   Physical Exam: Filed Vitals:   03/04/15 1536 03/04/15 1545 03/04/15 1600 03/04/15 1615  BP:  125/66 138/58 123/62  Pulse:  92 91 89  Temp: 100.1 F (37.8 C)     TempSrc: Oral     Resp:  21 23 18   SpO2:  98% 98% 97%    Wt Readings from Last 3 Encounters:  01/24/15 78.382 kg (172 lb 12.8 oz)  12/30/14 78.019 kg (172 lb)  09/28/14 76.658 kg (169 lb)    General:  Appears calm and comfortable, no  distress, significant memory deficits, oriented to self place and partly to time Eyes: PERRL, normal lids, irises & conjunctiva ENT: grossly normal hearing, lips & tongue Neck: no LAD, masses or thyromegaly Cardiovascular: RRR, no m/r/g. Trace LE edema. Telemetry: SR, no arrhythmias  Respiratory: Rhonchi at the right base Abdomen: soft, ntnd Skin: no rash or induration seen on limited exam Musculoskeletal: grossly normal tone BUE/BLE, deformities of the toes noted Psychiatric: grossly normal mood and affect, speech fluent and appropriate Neurologic: grossly non-focal.          Labs on Admission:  Basic Metabolic Panel:  Recent Labs Lab 03/04/15 1359  NA 140  K 3.9  CL 106  CO2 25  GLUCOSE 94  BUN 31*  CREATININE 2.00*  CALCIUM 8.7*   Liver Function Tests:  Recent Labs Lab 03/04/15 1359  AST 21  ALT 8*  ALKPHOS 64  BILITOT 1.1  PROT 5.9*  ALBUMIN 3.2*   No results for input(s): LIPASE, AMYLASE in the last 168 hours. No results for input(s): AMMONIA in the last 168 hours. CBC:  Recent Labs Lab 03/04/15 1359  WBC 10.5  NEUTROABS 9.3*  HGB 12.5*  HCT 38.8*  MCV 84.7  PLT 176   Cardiac Enzymes: No results for input(s): CKTOTAL, CKMB, CKMBINDEX, TROPONINI in the last 168 hours.  BNP (last 3 results) No results for input(s): BNP in the last 8760 hours.  ProBNP (last 3 results) No results for input(s): PROBNP in the last 8760 hours.  CBG: No results for input(s): GLUCAP in the last 168  hours.  Radiological Exams on Admission: Dg Chest 2 View  03/04/2015  CLINICAL DATA:  Shortness of breath with cough and fever EXAM: CHEST  2 VIEW COMPARISON:  October 02, 2013 FINDINGS: There is extensive right middle and right lower lobe airspace consolidation consistent with pneumonia. Left lung is clear. Heart size and pulmonary vascularity are normal. No adenopathy. Patient is status post coronary artery bypass grafting. There is degenerative change in both shoulders and in the thoracic spine. IMPRESSION: Extensive pneumonia in the right middle and lower lobes. Left lung clear. Followup PA and lateral chest radiographs recommended in 3-4 weeks following trial of antibiotic therapy to ensure resolution and exclude underlying malignancy. Electronically Signed   By: Lowella Grip III M.D.   On: 03/04/2015 14:47    EKG: Independently reviewed.NSR, T wave inversion in inferior leads, new from prior  Assessment/Plan Principal Problem:    Community acquired pneumonia -Extensive right middle and lower lobe pneumonia noted -Continue IV ceftriaxone and azithromycin -Follow-up blood cultures, check urine Legionella and pneumococcal antigen -SLP evaluation to rule out aspiration -will need FU imaging in 4-6weeks     Elevated Troponin -EKG with TWI in inferior leads, new from prior -Suspect demand ischemia related to above, and CKD,  if troponin trends up further will start heparin -Start aspirin, add low-dose metoprolol, continue statin check 2-D echocardiogram -Cardiology consulted by EDP due to EKG changes and extensive cardiac history,    CAD/CABG/PCI -see above, continue aspirin and statin and beta blocker added as above -Continue Imdur for now    DIASTOLIC HEART FAILURE, CHRONIC -Compensated, Lasix on hold     CKD stage III.  -Stable at baseline, stop NSAIDs    Parkinson's disease  -Stable with some memory deficits per family for the last year     H/o AAA repair  Code  Status:Full Code DVT Prophylaxis: lovenox Family Communication: son at bedside Disposition Plan: inpatient  Time spent: 18min  [facility] Pager [phone]

## 2015-03-04 NOTE — ED Notes (Signed)
Pt back from x-ray.

## 2015-03-04 NOTE — Progress Notes (Signed)
ANTICOAGULATION CONSULT NOTE - Initial Consult  Pharmacy Consult for IV Heparin Indication: chest pain/ACS  No Known Allergies  Patient Measurements: Height: 5' 10.5" (179.1 cm) Weight: 167 lb 12.3 oz (76.1 kg) IBW/kg (Calculated) : 74.15 Heparin Dosing Weight: using total body weight  Vital Signs: Temp: 99.1 F (37.3 C) (11/04 1736) Temp Source: Oral (11/04 1736) BP: 127/69 mmHg (11/04 1736) Pulse Rate: 81 (11/04 1736)  Labs:  Recent Labs  03/04/15 1359 03/04/15 1655  HGB 12.5*  --   HCT 38.8*  --   PLT 176  --   CREATININE 2.00*  --   TROPONINI  --  3.81*    Estimated Creatinine Clearance: 29.4 mL/min (by C-G formula based on Cr of 2).   Medical History: Past Medical History  Diagnosis Date  . CAD (coronary artery disease)     a. no known MImultiple PCIs;  b. S/P CABG;  c. all vein grafts occluded, LIMA patent by cath 08/2008.  Marland Kitchen Gout   . Macular degeneration     followed at [facility]  . PVD (peripheral vascular disease) ([place])   . OSA (obstructive sleep apnea)     using CPAP  . Diastolic heart failure     a. 08/2013 Echo: EF 60-65%, mild LVH, Gr 2 DD, mod MR.  Marland Kitchen Hypertension   . Hyperlipidemia   . Depression     symptoms  . CKD (chronic kidney disease), stage IV ([place])   . Renal adenoma     hx  . Nephrolithiasis     hx   . Hx of skin cancer, basal cell   . AAA (abdominal aortic aneurysm) ([place]) 2002    3.2 cm, with repair  . History of tobacco use     Remote (same for alcohol use)  . Claudication [facility])     a. 08/2013 ABI's R: 0.56, L 0.60 - f/u with Dr. Gwenlyn Found scheduled.  . Bradycardia     a. 08/2013  . Hypotension     a. 08/2013  . CKD (chronic kidney disease) stage 4, GFR 15-29 ml/min (HCC) 10/04/2013  . OSA on CPAP 10/04/2013  . Lower extremity ulceration (HCC)     right medial malleolus, healed  . TESTOSTERONE DEFICIENCY 11/25/2006    Qualifier: Diagnosis of  By: Teryl Lucy RN, Darnell Level   . Solitary pulmonary nodule 04/26/2009    04/28/09 Ct- No  worrisome pulmonary nodules.       Assessment: 60 yoM with CAD s/p bypass surgery in 1998 with PCI, normal EF, HTN, HLD, prior AAA repair, PVD, CKD-IV admitted with pneumonia.  Troponin drawn in ED mildly elevated.  Pt denies chest pain.  Repeat troponin this evening increased significantly.  Starting heparin infusion for ACS.  Pt has not received any anticoagulants this admission.  - Hgb 12.5, plts 176K - CrCl~29 ml/min  Goal of Therapy:  Heparin level 0.3-0.7 units/ml Monitor platelets by anticoagulation protocol: Yes   Plan:  1.  Baseline anticoag labs STAT. 2.  Heparin 4000 unit IV bolus then start heparin infusion at 900 units/hr. 3.  Check heparin level 8 hours after start of infusion.   4.  Daily heparin level and CBC while on heparin infusion.  Hershal Coria 03/04/2015,5:52 PM

## 2015-03-04 NOTE — Progress Notes (Signed)
CRITICAL VALUE ALERT  Critical value received:  Troponin 3.81  Date of notification:  03/04/2015  Time of notification:  7357  Critical value read back:Yes.    Nurse who received alert:  Dorcas Carrow, RN  MD notified (1st page):  Dr. Broadus John  Time of first page:  1745  MD notified (2nd page):  Time of second page:  Responding MD:  Dr. Broadus John  Time MD responded:  [phone]

## 2015-03-04 NOTE — ED Notes (Signed)
Pt can go to floor at 16:40

## 2015-03-04 NOTE — ED Notes (Signed)
Dr. Darl Householder made aware of i stat troponin results

## 2015-03-04 NOTE — ED Provider Notes (Signed)
CSN: [id number]     Arrival date & time 03/04/15  1321 History   First MD Initiated Contact with Patient 03/04/15 1454     Chief Complaint  Patient presents with  . Shortness of Breath     (Consider location/radiation/quality/duration/timing/severity/associated sxs/prior Treatment) HPI   81 year old male with cough, fever or generalized weakness. Onset of cough about 2 weeks ago. Progressively worsening. Productive. Worsening shortness of breath over the last couple days. Denies any pain. Subjective fever. Mild nausea this morning, but no vomiting. No unusual leg pain or swelling. No sick contacts. Extensive past medical history including CAD status post multiple interventions, peripheral vascular disease, diastolic heart failure, hypertension, hyperlipidemia, chronic kidney disease, obstructive sleep apnea, gout, macular degeneration.  Past Medical History  Diagnosis Date  . CAD (coronary artery disease)     a. no known MImultiple PCIs;  b. S/P CABG;  c. all vein grafts occluded, LIMA patent by cath 08/2008.  Marland Kitchen Gout   . Macular degeneration     [facility]ed at [facility]  . PVD (peripheral vascular disease) ([place])   . OSA (obstructive sleep apnea)     using CPAP  . Diastolic heart failure     a. 08/2013 Echo: EF 60-65%, mild LVH, Gr 2 DD, mod MR.  Marland Kitchen Hypertension   . Hyperlipidemia   . Depression     symptoms  . CKD (chronic kidney disease), stage IV ([place])   . Renal adenoma     hx  . Nephrolithiasis     hx   . Hx of skin cancer, basal cell   . AAA (abdominal aortic aneurysm) ([place]) 2002    3.2 cm, with repair  . History of tobacco use     Remote (same for alcohol use)  . Claudication [facility])     a. 08/2013 ABI's R: 0.56, L 0.60 - f/u with Dr. Gwenlyn Found scheduled.  . Bradycardia     a. 08/2013  . Hypotension     a. 08/2013  . CKD (chronic kidney disease) stage 4, GFR 15-29 ml/min (HCC) 10/04/2013  . OSA on CPAP 10/04/2013  . Lower extremity ulceration (HCC)     right medial malleolus, healed  .  TESTOSTERONE DEFICIENCY 11/25/2006    Qualifier: Diagnosis of  By: Teryl Lucy RN, Darnell Level   . Solitary pulmonary nodule 04/26/2009    04/28/09 Ct- No worrisome pulmonary nodules.     Past Surgical History  Procedure Laterality Date  . Abdominal aortic aneurysm repair    . Transurethral resection of prostate    . Coronary artery bypass graft  1990    s/p multiple PCIs with occlusion of al SVGs and only patnet LIMA at cath 5/10   . Cholecystectomy    . Lumbar disc surgery      x5  . Coronary stent placement      multiple; 12/02  . Back surgery    . Prostate surgery     Family History  Problem Relation Age of Onset  . Emphysema Father   . Heart disease Paternal Uncle     x2  . Heart attack Father   . Hypertension Father    Social History  Substance Use Topics  . Smoking status: Former Smoker -- 2.00 packs/day for 52 years    Quit date: 04/30/1988  . Smokeless tobacco: Never Used     Comment: smoked x52 years; up to 2 ppd   . Alcohol Use: 0.0 oz/week    0 Standard drinks or equivalent per week  Comment: 2-3 drink a day    Review of Systems  All systems reviewed and negative, other than as noted in HPI.   Allergies  Review of patient's allergies indicates no known allergies.  Home Medications   Prior to Admission medications   Medication Sig Start Date End Date Taking? Authorizing Provider  amoxicillin (AMOXIL) 500 MG capsule Take four (4) capsules by mouth one prior to dental appointments 10/27/14  Yes Historical Provider, MD  aspirin 81 MG tablet Take 81 mg by mouth daily.   Yes Historical Provider, MD  buPROPion (WELLBUTRIN XL) 150 MG 24 hr tablet TAKE ONE TABLET BY MOUTH TWICE DAILY 10/19/14  Yes Marin Olp, MD  docusate sodium (COLACE) 100 MG capsule Take 100 mg by mouth at bedtime.   Yes Historical Provider, MD  fluticasone (FLONASE) 50 MCG/ACT nasal spray USE TWO SPRAYS IN EACH NOSTRIL EVERY DAY 12/24/14  Yes Marin Olp, MD  furosemide (LASIX) 40  MG tablet Take 1 tablet (40 mg total) by mouth daily. 01/24/15  Yes Brittainy Erie Noe, PA-C  isosorbide mononitrate (IMDUR) 60 MG 24 hr tablet Take 1 tablet (60 mg total) by mouth daily. 01/24/15  Yes Brittainy Erie Noe, PA-C  naproxen sodium (ANAPROX) 220 MG tablet Take 220-400 mg by mouth 2 (two) times daily as needed (pain).   Yes Historical Provider, MD  nitroGLYCERIN (NITROSTAT) 0.4 MG SL tablet Place 0.4 mg under the tongue every 5 (five) minutes as needed for chest pain.   Yes Historical Provider, MD  polyethylene glycol powder (GLYCOLAX/MIRALAX) powder Take 0.5 Containers by mouth daily as needed for mild constipation.  08/26/09  Yes Historical Provider, MD  potassium chloride (K-DUR) 10 MEQ tablet Take 2 tablets (20 mEq total) by mouth daily. Patient taking differently: Take 10 mEq by mouth daily.  01/24/15  Yes Brittainy Erie Noe, PA-C  simvastatin (ZOCOR) 20 MG tablet Take 1 tablet (20 mg total) by mouth daily. 01/24/15  Yes Brittainy Erie Noe, PA-C  triamcinolone cream (KENALOG) 0.1 % APPLY TO AFFECTED AREA 2 TIMES DAILY UNTIL HEALED 02/22/15  Yes Historical Provider, MD  FLUZONE HIGH-DOSE 0.5 ML SUSY ADM 0.5ML IM UTD 02/18/15   Historical Provider, MD   BP 120/58 mmHg  Pulse 94  Temp(Src) 102.4 F (39.1 C) (Rectal)  Resp 22  SpO2 97% Physical Exam  Constitutional: He appears well-developed and well-nourished. No distress.  Sitting up in bed. Awake. Alert. No acute distress.  HENT:  Head: Normocephalic and atraumatic.  Eyes: Conjunctivae are normal. Right eye exhibits no discharge. Left eye exhibits no discharge.  Neck: Neck supple.  Cardiovascular: Regular rhythm and normal heart sounds.  Exam reveals no gallop and no friction rub.   No murmur heard. Mild tachycardia. Sternotomy scar.  Pulmonary/Chest: Effort normal. No respiratory distress.  Mild tachypnea. Coarse breath sounds bilaterally.  Abdominal: Soft. He exhibits no distension. There is no tenderness.   Musculoskeletal: He exhibits no edema or tenderness.  Neurological: He is alert.  Skin: Skin is warm and dry.  Psychiatric: He has a normal mood and affect. His behavior is normal. Thought content normal.  Nursing note and vitals reviewed.   ED Course  Procedures (including critical care time) Labs Review Labs Reviewed  COMPREHENSIVE METABOLIC PANEL - Abnormal; Notable for the following:    BUN 31 (*)    Creatinine, Ser 2.00 (*)    Calcium 8.7 (*)    Total Protein 5.9 (*)    Albumin 3.2 (*)    ALT 8 (*)  GFR calc non Af Amer 29 (*)    GFR calc Af Amer 34 (*)    All other components within normal limits  CBC WITH DIFFERENTIAL/PLATELET - Abnormal; Notable for the following:    Hemoglobin 12.5 (*)    HCT 38.8 (*)    RDW 16.3 (*)    Neutro Abs 9.3 (*)    Lymphs Abs 0.3 (*)    All other components within normal limits  I-STAT TROPOININ, ED - Abnormal; Notable for the following:    Troponin i, poc 0.54 (*)    All other components within normal limits  CULTURE, BLOOD (ROUTINE X 2)  CULTURE, BLOOD (ROUTINE X 2)  URINE CULTURE  URINALYSIS, ROUTINE W REFLEX MICROSCOPIC (NOT AT [facility])  I-STAT CG4 LACTIC ACID, ED    Imaging Review Dg Chest 2 View  03/04/2015  CLINICAL DATA:  Shortness of breath with cough and fever EXAM: CHEST  2 VIEW COMPARISON:  October 02, 2013 FINDINGS: There is extensive right middle and right lower lobe airspace consolidation consistent with pneumonia. Left lung is clear. Heart size and pulmonary vascularity are normal. No adenopathy. Patient is status post coronary artery bypass grafting. There is degenerative change in both shoulders and in the thoracic spine. IMPRESSION: Extensive pneumonia in the right middle and lower lobes. Left lung clear. Followup PA and lateral chest radiographs recommended in 3-4 weeks following trial of antibiotic therapy to ensure resolution and exclude underlying malignancy. Electronically Signed   By: Lowella Grip III M.D.   On:  03/04/2015 14:47   I have personally reviewed and evaluated these images and lab results as part of my medical decision-making.   EKG Interpretation   Date/Time:  Friday March 04 2015 13:46:23 EDT Ventricular Rate:  103 PR Interval:  203 QRS Duration: 110 QT Interval:  366 QTC Calculation: 479 R Axis:   94 Text Interpretation:  Sinus or ectopic atrial tachycardia Inferior  infarct, age indeterminate Consider anterior infarct Confirmed by [place]   MD, [place] ([id number]) on 03/04/2015 3:16:37 PM      MDM   Final diagnoses:  CAP (community acquired pneumonia)  Elevated troponin    81 year old male with cough and generalized fatigue. Imaging significant for right-sided pneumonia. Will cover for CAP. Consider aspiration of pneumonia. Patient reports that he often chokes and coughs when eating. Swallow study 4/25 showing mild dysfunction. Oxygen saturations 83% on room air when he first arrived. Now normal with oxygen via nasal cannula. Workup additionally significant for an elevation in his troponin.  Suspect that this is demand ischemia. EKG does show new T-wave inversions inferiorly though. He denies any chest pain. Aspirin ordered. Heparin deferred at this time.      Virgel Manifold, MD 03/06/15 [id number]

## 2015-03-04 NOTE — ED Notes (Signed)
Below orders not completed by EW. 

## 2015-03-04 NOTE — ED Notes (Signed)
Yao notified of pt VS and complaint.

## 2015-03-04 NOTE — Consult Note (Signed)
Admit date: 03/04/2015 Referring Physician  Dr. Broadus John Primary Physician Garret Reddish, MD Primary Cardiologist  Dr. Aundra Dubin Reason for Consultation  Elevated troponin  HPI: 81 year old male with coronary artery disease status post bypass surgery in 1998 with subsequent PCI by Dr. Maurene Capes, normal ejection fraction, hypertension, hyperlipidemia, stage IV chronic kidney disease here with pneumonia. Troponin was drawn in the emergency department and was 0.5, mildly elevated. He states that he is not having any chest discomfort, no anginal symptoms over the last several weeks. He has been feeling well since his last PCI.  Over the last few days however he is developed a strong cough and generalized malaise consistent with pneumonia. Memory loss noted with Parkinson's disease as well as macular degeneration.  When he first arrived in the emergency room his temperature was 102.4 and he was mildly tachycardic and hypoxic with right middle and lower lobe pneumonia. EKG did show nonspecific T-wave changes, baseline wander/artifact. When compared to prior EKG from 01/24/15, he did not have T-wave inversions in the inferior leads on prior EKG. He did however have downsloping ST segments with T-wave inversion in the lateral precordial leads previously. These T waves/ST segment changes are accentuated by downsloping baseline wander/artifact on current EKG.  After receiving IV fluids and ceftriaxone/azithromycin IV he is starting to feel better.  He also has had a prior AAA repair as well as peripheral vascular disease. His most recent left heart catheterization was in 2010 which showed patent LIMA to LAD, occluded SVG to diagonal, occluded SVG to circumflex, occluded SVG to RCA with 40% in stent stenosis at the anastomosis of LAD/LIMA. Medical therapy was elected at that time.   echocardiogram May 2015 showed normal LV function with EF of 60-65%, moderate mitral regurgitation and grade 2 diastolic dysfunction.    Dr. Gwenlyn Found has followed his peripheral vascular disease.  In the past he has had issues with bradycardia as well as hypotension which led to discontinuation of amlodipine, carvedilol, losartan. His main physical complaint happens to be macular degeneration.    PMH:   Past Medical History  Diagnosis Date  . CAD (coronary artery disease)     a. no known MImultiple PCIs;  b. S/P CABG;  c. all vein grafts occluded, LIMA patent by cath 08/2008.  Marland Kitchen Gout   . Macular degeneration     followed at [facility]  . PVD (peripheral vascular disease) ([place])   . OSA (obstructive sleep apnea)     using CPAP  . Diastolic heart failure     a. 08/2013 Echo: EF 60-65%, mild LVH, Gr 2 DD, mod MR.  Marland Kitchen Hypertension   . Hyperlipidemia   . Depression     symptoms  . CKD (chronic kidney disease), stage IV ([place])   . Renal adenoma     hx  . Nephrolithiasis     hx   . Hx of skin cancer, basal cell   . AAA (abdominal aortic aneurysm) ([place]) 2002    3.2 cm, with repair  . History of tobacco use     Remote (same for alcohol use)  . Claudication [facility])     a. 08/2013 ABI's R: 0.56, L 0.60 - f/u with Dr. Gwenlyn Found scheduled.  . Bradycardia     a. 08/2013  . Hypotension     a. 08/2013  . CKD (chronic kidney disease) stage 4, GFR 15-29 ml/min (HCC) 10/04/2013  . OSA on CPAP 10/04/2013  . Lower extremity ulceration (HCC)     right medial  malleolus, healed  . TESTOSTERONE DEFICIENCY 11/25/2006    Qualifier: Diagnosis of  By: Teryl Lucy RN, Darnell Level   . Solitary pulmonary nodule 04/26/2009    04/28/09 Ct- No worrisome pulmonary nodules.      PSH:   Past Surgical History  Procedure Laterality Date  . Abdominal aortic aneurysm repair    . Transurethral resection of prostate    . Coronary artery bypass graft  1990    s/p multiple PCIs with occlusion of al SVGs and only patnet LIMA at cath 5/10   . Cholecystectomy    . Lumbar disc surgery      x5  . Coronary stent placement      multiple; 12/02  . Back surgery    .  Prostate surgery     Allergies:  Review of patient's allergies indicates no known allergies. Prior to Admit Meds:   Prior to Admission medications   Medication Sig Start Date End Date Taking? Authorizing Provider  amoxicillin (AMOXIL) 500 MG capsule Take four (4) capsules by mouth one prior to dental appointments 10/27/14  Yes Historical Provider, MD  aspirin 81 MG tablet Take 81 mg by mouth daily.   Yes Historical Provider, MD  buPROPion (WELLBUTRIN XL) 150 MG 24 hr tablet TAKE ONE TABLET BY MOUTH TWICE DAILY 10/19/14  Yes Marin Olp, MD  docusate sodium (COLACE) 100 MG capsule Take 100 mg by mouth at bedtime.   Yes Historical Provider, MD  fluticasone (FLONASE) 50 MCG/ACT nasal spray USE TWO SPRAYS IN EACH NOSTRIL EVERY DAY 12/24/14  Yes Marin Olp, MD  furosemide (LASIX) 40 MG tablet Take 1 tablet (40 mg total) by mouth daily. 01/24/15  Yes Brittainy Erie Noe, PA-C  isosorbide mononitrate (IMDUR) 60 MG 24 hr tablet Take 1 tablet (60 mg total) by mouth daily. 01/24/15  Yes Brittainy Erie Noe, PA-C  naproxen sodium (ANAPROX) 220 MG tablet Take 220-400 mg by mouth 2 (two) times daily as needed (pain).   Yes Historical Provider, MD  nitroGLYCERIN (NITROSTAT) 0.4 MG SL tablet Place 0.4 mg under the tongue every 5 (five) minutes as needed for chest pain.   Yes Historical Provider, MD  polyethylene glycol powder (GLYCOLAX/MIRALAX) powder Take 0.5 Containers by mouth daily as needed for mild constipation.  08/26/09  Yes Historical Provider, MD  potassium chloride (K-DUR) 10 MEQ tablet Take 2 tablets (20 mEq total) by mouth daily. Patient taking differently: Take 10 mEq by mouth daily.  01/24/15  Yes Brittainy Erie Noe, PA-C  simvastatin (ZOCOR) 20 MG tablet Take 1 tablet (20 mg total) by mouth daily. 01/24/15  Yes Brittainy Erie Noe, PA-C  triamcinolone cream (KENALOG) 0.1 % APPLY TO AFFECTED AREA 2 TIMES DAILY UNTIL HEALED 02/22/15  Yes Historical Provider, MD  FLUZONE HIGH-DOSE 0.5 ML SUSY  ADM 0.5ML IM UTD 02/18/15   Historical Provider, MD   Fam HX:    Family History  Problem Relation Age of Onset  . Emphysema Father   . Heart disease Paternal Uncle     x2  . Heart attack Father   . Hypertension Father    Social HX:    Social History   Social History  . Marital Status: Married    Spouse Name: N/A  . Number of Children: N/A  . Years of Education: N/A   Occupational History  . Owner of used car lot     Retired   Social History Main Topics  . Smoking status: Former Smoker -- 2.00 packs/day for 52  years    Quit date: 04/30/1988  . Smokeless tobacco: Never Used     Comment: smoked x52 years; up to 2 ppd   . Alcohol Use: 0.0 oz/week    0 Standard drinks or equivalent per week     Comment: 2-3 drink a day  . Drug Use: No  . Sexual Activity: Not Currently   Other Topics Concern  . Not on file   Social History Narrative   Retired used [employer] officer   Does not get regular exercise   Married with children   Wife has dementia and was [facility] to move into assisted living by her dtr, against pts wishes.     ROS:  All 11 ROS were addressed and are negative except what is stated in the HPI   Physical Exam: Blood pressure 123/62, pulse 89, temperature 100.1 F (37.8 C), temperature source Oral, resp. rate 18, SpO2 97 %.   General: Well developed, well nourished,  elderly in no acute distress Head: Eyes PERRLA, No xanthomas.   Normal cephalic and atramatic  Lungs:   Clear bilaterally to auscultation and percussion. Normal respiratory effort. No wheezes, no rales. Heart:   HRRR S1 S2 Pulses are 2+ & equal. soft systolic apical  murmur, rubs, gallops.  No carotid bruit. No JVD.  No abdominal bruits.  prior bypass scar noted  Abdomen: Bowel sounds are positive, abdomen soft and non-tender without masses. No hepatosplenomegaly. Msk:  Back normal. Normal strength and tone for age. Extremities:  No clubbing, cyanosis or edema.  DP +1. Nodules noted on right  great toe medially as well as scattered on his left foot. He was told these were cholesterol nodules in the past. These are chronic.  Neuro: Alert, non-focal, MAE x 4 GU: Deferred Rectal: Deferred Psych:  Good affect, responds appropriately      Labs: Lab Results  Component Value Date   WBC 10.5 03/04/2015   HGB 12.5* 03/04/2015   HCT 38.8* 03/04/2015   MCV 84.7 03/04/2015   PLT 176 03/04/2015     Recent Labs Lab 03/04/15 1359  NA 140  K 3.9  CL 106  CO2 25  BUN 31*  CREATININE 2.00*  CALCIUM 8.7*  PROT 5.9*  BILITOT 1.1  ALKPHOS 64  ALT 8*  AST 21  GLUCOSE 94   No results for input(s): CKTOTAL, CKMB, TROPONINI in the last 72 hours. Lab Results  Component Value Date   CHOL 124 07/13/2013   HDL 36.90* 07/13/2013   LDLCALC 67 07/13/2013   TRIG 99.0 07/13/2013   Lab Results  Component Value Date   DDIMER * 09/04/2008    1.66        AT THE INHOUSE ESTABLISHED CUTOFF VALUE OF 0.48 ug/mL FEU, THIS ASSAY HAS BEEN DOCUMENTED IN THE LITERATURE TO HAVE A SENSITIVITY AND NEGATIVE PREDICTIVE VALUE OF AT LEAST 98 TO 99%.  THE TEST RESULT SHOULD BE CORRELATED WITH AN ASSESSMENT OF THE CLINICAL PROBABILITY OF DVT / VTE.     Radiology:  Dg Chest 2 View  03/04/2015  CLINICAL DATA:  Shortness of breath with cough and fever EXAM: CHEST  2 VIEW COMPARISON:  October 02, 2013 FINDINGS: There is extensive right middle and right lower lobe airspace consolidation consistent with pneumonia. Left lung is clear. Heart size and pulmonary vascularity are normal. No adenopathy. Patient is status post coronary artery bypass grafting. There is degenerative change in both shoulders and in the thoracic spine. IMPRESSION: Extensive pneumonia in the  right middle and lower lobes. Left lung clear. Followup PA and lateral chest radiographs recommended in 3-4 weeks following trial of antibiotic therapy to ensure resolution and exclude underlying malignancy. Electronically Signed   By: Lowella Grip III M.D.   On: 03/04/2015 14:47   Personally viewed.  EKG:  as described above in history of present illness  Personally viewed.   ASSESSMENT/PLAN:    81 year old male with right middle and lower lobe pneumonia, fever with extensive history of coronary artery disease status post CABG and subsequent PCI's, normal EF on recent check with mildly elevated troponin of 0.5.   Elevated troponin  - This most likely represents demand ischemia in the setting of pneumonia adding extra stress and strain on myocardial contraction. At this point, I do not believe this is true acute coronary syndrome, i.e. thrombotic activity.  - Continue to cycle cardiac markers. If for some reason the troponin greatly increases, his diagnosis may change.  - I would recommend obtaining an echocardiogram to ensure similar structure and function as previously noted in 2015, EF 60-65%.  - Continue with secondary prevention. No beta blocker because of previous bradycardia. No ACE inhibitor because of previous hypotension.  - No IV heparin, no invasive therapy. Unless symptoms arise, no further cardiac assessment necessary other than echocardiogram.  - Would be reasonable to repeat EKG in a.m. I'm fine with addition of aspirin.  - Elevated troponin does portend worsened prognosis increased morbidity/mortality.  Pneumonia  - Currently on ceftriaxone and azithromycin IV per primary team. Feels better.  Chronic kidney disease stage IV  -  baseline creatinine between 1.9 and 2.2.   - currently within that range at 2.0.  Essential hypertension  - Currently well controlled. Has noted white coat syndrome in the past. Previously in the 130s to 140s at home. Has had multiple hypotension episodes on prior agents.  Peripheral vascular disease  - Followed by Dr. Gwenlyn Found  Abdominal aortic aneurysm repair -Dr. early in 2002   Chronic diastolic heart failure -stable, no dyspnea, no orthopnea. Appears euvolemic slightly dry  actually.   - Agree with holding Lasix  Coronary artery disease/post CABG/post PCI  - Okay with continuing Imdur.  - Aspirin, statin.  - I'm going to hold beta blocker because of previous bradycardia and hypotensive episodes in the past.  We will follow along.   Candee Furbish, MD  03/04/2015  5:05 PM

## 2015-03-04 NOTE — ED Notes (Signed)
Pt to xray

## 2015-03-05 ENCOUNTER — Inpatient Hospital Stay (HOSPITAL_COMMUNITY): Payer: Medicare Other

## 2015-03-05 DIAGNOSIS — G20 Parkinson's disease: Secondary | ICD-10-CM

## 2015-03-05 LAB — BASIC METABOLIC PANEL
Anion gap: 7 (ref 5–15)
BUN: 37 mg/dL — AB (ref 6–20)
CALCIUM: 8.3 mg/dL — AB (ref 8.9–10.3)
CHLORIDE: 103 mmol/L (ref 101–111)
CO2: 26 mmol/L (ref 22–32)
CREATININE: 2.33 mg/dL — AB (ref 0.61–1.24)
GFR, EST AFRICAN AMERICAN: 28 mL/min — AB (ref >60)
GFR, EST NON AFRICAN AMERICAN: 24 mL/min — AB (ref >60)
Glucose, Bld: 78 mg/dL (ref 65–99)
Potassium: 4.6 mmol/L (ref 3.5–5.1)
SODIUM: 136 mmol/L (ref 135–145)

## 2015-03-05 LAB — CBC
HCT: 33.7 % — ABNORMAL LOW (ref 39.0–52.0)
HEMOGLOBIN: 10.8 g/dL — AB (ref 13.0–17.0)
MCH: 26.6 pg (ref 26.0–34.0)
MCHC: 32 g/dL (ref 30.0–36.0)
MCV: 83 fL (ref 78.0–100.0)
PLATELETS: 160 10*3/uL (ref 150–400)
RBC: 4.06 MIL/uL — ABNORMAL LOW (ref 4.22–5.81)
RDW: 16.2 % — AB (ref 11.5–15.5)
WBC: 17.4 10*3/uL — ABNORMAL HIGH (ref 4.0–10.5)

## 2015-03-05 LAB — HEPARIN LEVEL (UNFRACTIONATED)
Heparin Unfractionated: 0.28 IU/mL — ABNORMAL LOW (ref 0.30–0.70)
Heparin Unfractionated: 0.17 IU/mL — ABNORMAL LOW (ref 0.30–0.70)
Heparin Unfractionated: 0.35 IU/mL (ref 0.30–0.70)

## 2015-03-05 LAB — LEGIONELLA PNEUMOPHILA SEROGP 1 UR AG: L. PNEUMOPHILA SEROGP 1 UR AG: NEGATIVE

## 2015-03-05 LAB — TROPONIN I
TROPONIN I: 8.43 ng/mL — AB (ref <0.031)
Troponin I: 6.19 ng/mL (ref <0.031)
Troponin I: 4.75 ng/mL (ref <0.031)

## 2015-03-05 MED ORDER — HEPARIN BOLUS VIA INFUSION
2000.0000 [IU] | Freq: Once | INTRAVENOUS | Status: AC
  Administered 2015-03-05: 2000 [IU] via INTRAVENOUS
  Filled 2015-03-05: qty 2000

## 2015-03-05 MED ORDER — SODIUM CHLORIDE 0.9 % IV SOLN
INTRAVENOUS | Status: DC
  Administered 2015-03-05: 10:00:00 via INTRAVENOUS

## 2015-03-05 MED ORDER — HEPARIN (PORCINE) IN NACL 100-0.45 UNIT/ML-% IJ SOLN
1400.0000 [IU]/h | INTRAMUSCULAR | Status: DC
  Administered 2015-03-05: 1350 [IU]/h via INTRAVENOUS
  Administered 2015-03-07: 1400 [IU]/h via INTRAVENOUS
  Filled 2015-03-05 (×4): qty 250

## 2015-03-05 MED ORDER — CLOPIDOGREL BISULFATE 75 MG PO TABS
75.0000 mg | ORAL_TABLET | Freq: Every day | ORAL | Status: DC
  Administered 2015-03-05 – 2015-03-06 (×2): 75 mg via ORAL
  Filled 2015-03-05 (×2): qty 1

## 2015-03-05 MED ORDER — CETYLPYRIDINIUM CHLORIDE 0.05 % MT LIQD
7.0000 mL | Freq: Two times a day (BID) | OROMUCOSAL | Status: DC
  Administered 2015-03-06: 7 mL via OROMUCOSAL

## 2015-03-05 MED ORDER — CARVEDILOL 3.125 MG PO TABS
3.1250 mg | ORAL_TABLET | Freq: Two times a day (BID) | ORAL | Status: DC
  Administered 2015-03-05 – 2015-03-06 (×3): 3.125 mg via ORAL
  Filled 2015-03-05 (×3): qty 1

## 2015-03-05 NOTE — Progress Notes (Signed)
  Echocardiogram 2D Echocardiogram has been performed.  Harold [name] M 03/05/2015, 11:15 AM

## 2015-03-05 NOTE — Progress Notes (Signed)
Patient ID: Harold [name], male   DOB: 26-Apr-1929, 81 y.o.   MRN: [id number]    Subjective:  Denies SSCP, palpitations or Dyspnea Lots of stress.  [place] and he is hear alone  Objective:  Filed Vitals:   03/04/15 1705 03/04/15 1736 03/04/15 2010 03/05/15 0503  BP: 120/56 127/69 106/52 133/70  Pulse: 86 81 76 76  Temp: 100.3 F (37.9 C) 99.1 F (37.3 C) 98.8 F (37.1 C) 97.9 F (36.6 C)  TempSrc: Rectal Oral Oral Oral  Resp: 22 18 16 16   Height:  5' 10.5" (1.791 m)    Weight:  76.1 kg (167 lb 12.3 oz)    SpO2: 96% 95% 96% 98%    Intake/Output from previous day:  Intake/Output Summary (Last 24 hours) at 03/05/15 0845 Last data filed at 03/05/15 0820  Gross per 24 hour  Intake 258.48 ml  Output    150 ml  Net 108.48 ml    Physical Exam: Affect appropriate Chronically ill white male  HEENT: legally blind MD Neck supple with no adenopathy JVP normal no bruits no thyromegaly Lungsbilateral rhonchi  Exp wheezing  and good diaphragmatic motion Heart:  S1/S2 SEM murmur, no rub, gallop or click PMI normal Abdomen: benighn, BS positve, no tenderness, no AAA no bruit.  No HSM or HJR Distal pulses intact with no bruits No edema Neuro non-focal Skin warm and dry No muscular weakness   Lab Results: Basic Metabolic Panel:  Recent Labs  03/04/15 1359 03/05/15 0255  NA 140 136  K 3.9 4.6  CL 106 103  CO2 25 26  GLUCOSE 94 78  BUN 31* 37*  CREATININE 2.00* 2.33*  CALCIUM 8.7* 8.3*   Liver Function Tests:  Recent Labs  03/04/15 1359  AST 21  ALT 8*  ALKPHOS 64  BILITOT 1.1  PROT 5.9*  ALBUMIN 3.2*   CBC:  Recent Labs  03/04/15 1359 03/05/15 0255  WBC 10.5 17.4*  NEUTROABS 9.3*  --   HGB 12.5* 10.8*  HCT 38.8* 33.7*  MCV 84.7 83.0  PLT 176 160   Cardiac Enzymes:  Recent Labs  03/04/15 1655 03/04/15 2220 03/05/15 0255  TROPONINI 3.81* 6.75* 8.43*    Imaging: Dg Chest 2 View  03/04/2015  CLINICAL DATA:   Shortness of breath with cough and fever EXAM: CHEST  2 VIEW COMPARISON:  October 02, 2013 FINDINGS: There is extensive right middle and right lower lobe airspace consolidation consistent with pneumonia. Left lung is clear. Heart size and pulmonary vascularity are normal. No adenopathy. Patient is status post coronary artery bypass grafting. There is degenerative change in both shoulders and in the thoracic spine. IMPRESSION: Extensive pneumonia in the right middle and lower lobes. Left lung clear. Followup PA and lateral chest radiographs recommended in 3-4 weeks following trial of antibiotic therapy to ensure resolution and exclude underlying malignancy. Electronically Signed   By: Lowella Grip III M.D.   On: 03/04/2015 14:47    Cardiac Studies:  ECG:  SR no acute ST changes    Telemetry:  NSR no VT  03/05/2015   Echo: pending   Medications:   . aspirin EC  325 mg Oral Daily  . azithromycin (ZITHROMAX) 500 MG IVPB  500 mg Intravenous Q24H  . buPROPion  150 mg Oral BID  . cefTRIAXone (ROCEPHIN)  IV  1 g Intravenous Q24H  . fluticasone  2 spray Each Nare Daily  . isosorbide mononitrate  60 mg Oral Daily  .  simvastatin  20 mg Oral Daily     . heparin 1,050 Units/hr (03/05/15 0417)    Assessment/Plan:  SEMI:  tropinin up to 8  Not a geat candidate for cath given age, active multi lobar penumonia and elevated Cr.  Continue heparin and nitrates  Add plavix as he is not a redo candidate Add back low dose beta blocker and watch for recurrent bradycardia.  Suspect will need myovue to risk stratify before hospital d/c Distant CABG in 98 likely graft failure  Pneumonia:  Continue antibiotics and follow CXR  ? Aspiration may need swallowing study  Chol:  On statin    Jenkins Rouge 03/05/2015, 8:45 AM

## 2015-03-05 NOTE — Progress Notes (Signed)
ANTICOAGULATION CONSULT NOTE - follow up  Pharmacy Consult for IV Heparin Indication: chest pain/ACS  No Known Allergies  Patient Measurements: Height: 5' 10.5" (179.1 cm) Weight: 167 lb 12.3 oz (76.1 kg) IBW/kg (Calculated) : 74.15 Heparin Dosing Weight: using total body weight  Vital Signs: BP: 130/66 mmHg (11/05 1623) Pulse Rate: 82 (11/05 1623)  Labs:  Recent Labs  03/04/15 1359  03/04/15 1805  03/05/15 0255 03/05/15 1215 03/05/15 2202  HGB 12.5*  --   --   --  10.8*  --   --   HCT 38.8*  --   --   --  33.7*  --   --   PLT 176  --   --   --  160  --   --   APTT  --   --  36  --   --   --   --   LABPROT  --   --  16.5*  --   --   --   --   INR  --   --  1.32  --   --   --   --   HEPARINUNFRC  --   --   --   --  0.28* 0.17* 0.35  CREATININE 2.00*  --   --   --  2.33*  --   --   TROPONINI  --   < >  --   < > 8.43* 6.19* 4.75*  < > = values in this interval not displayed.  Estimated Creatinine Clearance: 25.2 mL/min (by C-G formula based on Cr of 2.33).   Medical History: Past Medical History  Diagnosis Date  . CAD (coronary artery disease)     a. no known MImultiple PCIs;  b. S/P CABG;  c. all vein grafts occluded, LIMA patent by cath 08/2008.  Marland Kitchen Gout   . Macular degeneration     followed at [facility]  . PVD (peripheral vascular disease) ([place])   . OSA (obstructive sleep apnea)     using CPAP  . Diastolic heart failure     a. 08/2013 Echo: EF 60-65%, mild LVH, Gr 2 DD, mod MR.  Marland Kitchen Hypertension   . Hyperlipidemia   . Depression     symptoms  . CKD (chronic kidney disease), stage IV ([place])   . Renal adenoma     hx  . Nephrolithiasis     hx   . Hx of skin cancer, basal cell   . AAA (abdominal aortic aneurysm) ([place]) 2002    3.2 cm, with repair  . History of tobacco use     Remote (same for alcohol use)  . Claudication [facility]     a. 08/2013 ABI's R: 0.56, L 0.60 - f/u with Dr. Gwenlyn Found scheduled.  . Bradycardia     a. 08/2013  . Hypotension     a. 08/2013  . CKD  (chronic kidney disease) stage 4, GFR 15-29 ml/min (HCC) 10/04/2013  . OSA on CPAP 10/04/2013  . Lower extremity ulceration (HCC)     right medial malleolus, healed  . TESTOSTERONE DEFICIENCY 11/25/2006    Qualifier: Diagnosis of  By: Teryl Lucy RN, Darnell Level   . Solitary pulmonary nodule 04/26/2009    04/28/09 Ct- No worrisome pulmonary nodules.       Assessment: 31 yoM with CAD s/p bypass surgery in 1998 with PCI, normal EF, HTN, HLD, prior AAA repair, PVD, CKD-IV admitted with pneumonia.  Troponin drawn in ED mildly elevated.  Pt denies chest pain.  Repeat troponin this evening increased significantly.  Starting heparin infusion for ACS.  Pt has not received any anticoagulants this admission.  - Hgb 10.8, plts 160K - CrCl~25 ml/min - no complications of therapy noted -HL = 0.35 on heparin @ 1350 units/hr (therapeutic)  Goal of Therapy:  Heparin level 0.3-0.7 units/ml Monitor platelets by anticoagulation protocol: Yes   Plan:  - Continue heparin infusion @ 1350 units/hr. - Daily heparin level and CBC while on heparin infusion.  Leone Haven, PharmD  03/05/2015, 11:29 PM

## 2015-03-05 NOTE — Evaluation (Signed)
Clinical/Bedside Swallow Evaluation Patient Details  Name: Harold [name] MRN: [id number] Date of Birth: 1928/11/16  Today's Date: 03/05/2015 Time: SLP Start Time (ACUTE ONLY): 9030 SLP Stop Time (ACUTE ONLY): 1526 SLP Time Calculation (min) (ACUTE ONLY): 44 min  Past Medical History:  Past Medical History  Diagnosis Date  . CAD (coronary artery disease)     a. no known MImultiple PCIs;  b. S/P CABG;  c. all vein grafts occluded, LIMA patent by cath 08/2008.  Marland Kitchen Gout   . Macular degeneration     followed at [facility]  . PVD (peripheral vascular disease) ([place])   . OSA (obstructive sleep apnea)     using CPAP  . Diastolic heart failure     a. 08/2013 Echo: EF 60-65%, mild LVH, Gr 2 DD, mod MR.  Marland Kitchen Hypertension   . Hyperlipidemia   . Depression     symptoms  . CKD (chronic kidney disease), stage IV ([place])   . Renal adenoma     hx  . Nephrolithiasis     hx   . Hx of skin cancer, basal cell   . AAA (abdominal aortic aneurysm) ([place]) 2002    3.2 cm, with repair  . History of tobacco use     Remote (same for alcohol use)  . Claudication [facility])     a. 08/2013 ABI's R: 0.56, L 0.60 - f/u with Dr. Gwenlyn Found scheduled.  . Bradycardia     a. 08/2013  . Hypotension     a. 08/2013  . CKD (chronic kidney disease) stage 4, GFR 15-29 ml/min (HCC) 10/04/2013  . OSA on CPAP 10/04/2013  . Lower extremity ulceration (HCC)     right medial malleolus, healed  . TESTOSTERONE DEFICIENCY 11/25/2006    Qualifier: Diagnosis of  By: Teryl Lucy RN, Darnell Level   . Solitary pulmonary nodule 04/26/2009    04/28/09 Ct- No worrisome pulmonary nodules.     Past Surgical History:  Past Surgical History  Procedure Laterality Date  . Abdominal aortic aneurysm repair    . Transurethral resection of prostate    . Coronary artery bypass graft  1990    s/p multiple PCIs with occlusion of al SVGs and only patnet LIMA at cath 5/10   . Cholecystectomy    . Lumbar disc surgery      x5  . Coronary stent placement       multiple; 12/02  . Back surgery    . Prostate surgery     HPI:  Harold [name] is a 81 y.o. male the past medical history of CAD, CABG, chronic diastolic CHF, heavy former smoker, CKD 3, Parkinson's disease, memory loss, macular degeneration presents with shortness of breath. Patient reports productive cough with thick yellow phlegm and progressive shortness of breath for the last 2 weeks. He is a poor historian, lives in alone and has a caregiver during the daytime. In the emergency room he was found to be hypoxic, tachycardic with a temperature of 102.4, chest x-ray with extensive right middle and lower lobe pneumonia concerning of aspiration etiology. ST to evaluate current swallow function.   Assessment / Plan / Recommendation Clinical Impression  Pt presents with mild oropharyngeal dypshagia with suspected episodic aspiration events. Pts son present during BSE to confirm observable episodic moments at his home. Son reports caregiver present intermittently during the day with pt but not consistently. Pt with history of dysphagia, with most recent MBS 04/16 revealing no aspiration of any consistencies and minimal penetration of thin  liquids with use of a straw.  This date clinically pt exhibited reduced coordination and poor labial seal with thin liquids via cup. Pt with better oral control with use of a straw however previous MBS revealed laryngeal penetration with straw usage. Pt with delayed throat clearing and mild intermittent wet vocal quality which cleared with volitional swallow by the patient. Recommend repeat MBS given recurrent PNA and reports of episodic aspiration per family to gain more current objective data regarding pts swallow function. Continue regular diet and thin liquids with full supervision to cue for safe swallow strategies. Medicines whole with puree to further reduce aspiration risk. ST to follow up and complete MBS as soon as possible. Son concerned about level of care  needs post hospitalization, requesting looking into 24 hour care options when DC planning is initiated. Son also concerned that pt will be against full time care recommendations.        Aspiration Risk  Moderate    Diet Recommendation Age appropriate regular solids;Thin   Medication Administration: Whole meds with puree Compensations: Minimize environmental distractions;Small sips/bites;Slow rate;Multiple dry swallows after each bite/sip    Other  Recommendations Oral Care Recommendations: Oral care BID   Follow Up Recommendations       Frequency and Duration min 2x/week  1 week   Pertinent Vitals/Pain     SLP Swallow Goals     Swallow Study Prior Functional Status       General Date of Onset: 03/04/15 Other Pertinent Information: Harold [name] is a 81 y.o. male the past medical history of CAD, CABG, chronic diastolic CHF, heavy former smoker, CKD 3, Parkinson's disease, memory loss, macular degeneration presents with shortness of breath. Patient reports productive cough with thick yellow phlegm and progressive shortness of breath for the last 2 weeks. He is a poor historian, lives in alone and has a caregiver during the daytime. In the emergency room he was found to be hypoxic, tachycardic with a temperature of 102.4, chest x-ray with extensive right middle and lower lobe pneumonia concerning of aspiration etiology. ST to evaluate current swallow function. Type of Study: Bedside swallow evaluation Previous Swallow Assessment: MBS 04/16: reg/thin recommendations Diet Prior to this Study: Regular;Thin liquids Temperature Spikes Noted: Yes Respiratory Status: Supplemental O2 delivered via (comment) History of Recent Intubation: No Behavior/Cognition: Alert;Confused;Pleasant mood Oral Cavity - Dentition: Missing dentition Self-Feeding Abilities: Needs assist Patient Positioning: Upright in bed Baseline Vocal Quality: Wet Volitional Cough: Congested Volitional Swallow: Able  to elicit    Oral/Motor/Sensory Function Overall Oral Motor/Sensory Function: Appears within functional limits for tasks assessed   Ice Chips Ice chips: Impaired Presentation: Spoon Oral Phase Impairments: Reduced lingual movement/coordination Oral Phase Functional Implications: Prolonged oral transit Pharyngeal Phase Impairments: Suspected delayed Swallow   Thin Liquid Thin Liquid: Impaired Presentation: Cup;Straw;Spoon Oral Phase Impairments: Reduced lingual movement/coordination;Reduced labial seal Oral Phase Functional Implications: Right anterior spillage;Prolonged oral transit Pharyngeal  Phase Impairments: Suspected delayed Swallow;Wet Vocal Quality;Multiple swallows;Throat Clearing - Delayed    Nectar Thick Nectar Thick Liquid: Not tested   Honey Thick Honey Thick Liquid: Not tested   Puree Puree: Within functional limits   Solid   GO Arvil Chaco MA, CCC-SLP Acute Care Speech Language Pathologist       Solid: Within functional limits       Arvil Chaco E 03/05/2015,3:43 PM

## 2015-03-05 NOTE — Progress Notes (Signed)
PHARMACY - HEPARIN (brief note)  IV heparin gtt infusing @ 900 units/hr for ACS  Heparin level - 0.28 (Goal 0.3-0.7) H/H = 10.8/33.7; PLTC = 154 No complications of therapy noted  Assessment:  Heparin level subtherapeutic  Plan:  Increase Heparin gtt to 1050 units/hr           Check heparin level 8 hr after rate increase  Leone Haven, PharmD

## 2015-03-05 NOTE — Progress Notes (Signed)
ANTICOAGULATION CONSULT NOTE - follow up  Pharmacy Consult for IV Heparin Indication: chest pain/ACS  No Known Allergies  Patient Measurements: Height: 5' 10.5" (179.1 cm) Weight: 167 lb 12.3 oz (76.1 kg) IBW/kg (Calculated) : 74.15 Heparin Dosing Weight: using total body weight  Vital Signs: Temp: 97.9 F (36.6 C) (11/05 0503) Temp Source: Oral (11/05 0503) BP: 133/70 mmHg (11/05 0503) Pulse Rate: 76 (11/05 0503)  Labs:  Recent Labs  03/04/15 1359  03/04/15 1805 03/04/15 2220 03/05/15 0255 03/05/15 1215  HGB 12.5*  --   --   --  10.8*  --   HCT 38.8*  --   --   --  33.7*  --   PLT 176  --   --   --  160  --   APTT  --   --  36  --   --   --   LABPROT  --   --  16.5*  --   --   --   INR  --   --  1.32  --   --   --   HEPARINUNFRC  --   --   --   --  0.28* 0.17*  CREATININE 2.00*  --   --   --  2.33*  --   TROPONINI  --   < >  --  6.75* 8.43* 6.19*  < > = values in this interval not displayed.  Estimated Creatinine Clearance: 25.2 mL/min (by C-G formula based on Cr of 2.33).   Medical History: Past Medical History  Diagnosis Date  . CAD (coronary artery disease)     a. no known MImultiple PCIs;  b. S/P CABG;  c. all vein grafts occluded, LIMA patent by cath 08/2008.  Marland Kitchen Gout   . Macular degeneration     [facility]ed at [facility]  . PVD (peripheral vascular disease) ([place])   . OSA (obstructive sleep apnea)     using CPAP  . Diastolic heart failure     a. 08/2013 Echo: EF 60-65%, mild LVH, Gr 2 DD, mod MR.  Marland Kitchen Hypertension   . Hyperlipidemia   . Depression     symptoms  . CKD (chronic kidney disease), stage IV ([place])   . Renal adenoma     hx  . Nephrolithiasis     hx   . Hx of skin cancer, basal cell   . AAA (abdominal aortic aneurysm) ([place]) 2002    3.2 cm, with repair  . History of tobacco use     Remote (same for alcohol use)  . Claudication [facility])     a. 08/2013 ABI's R: 0.56, L 0.60 - f/u with Dr. Gwenlyn Found scheduled.  . Bradycardia     a. 08/2013  . Hypotension      a. 08/2013  . CKD (chronic kidney disease) stage 4, GFR 15-29 ml/min (HCC) 10/04/2013  . OSA on CPAP 10/04/2013  . Lower extremity ulceration (HCC)     right medial malleolus, healed  . TESTOSTERONE DEFICIENCY 11/25/2006    Qualifier: Diagnosis of  By: Teryl Lucy RN, Darnell Level   . Solitary pulmonary nodule 04/26/2009    04/28/09 Ct- No worrisome pulmonary nodules.       Assessment: 18 yoM with CAD s/p bypass surgery in 1998 with PCI, normal EF, HTN, HLD, prior AAA repair, PVD, CKD-IV admitted with pneumonia.  Troponin drawn in ED mildly elevated.  Pt denies chest pain.  Repeat troponin this evening increased significantly.  Starting heparin infusion for ACS.  Pt has not received any anticoagulants this admission.  - Hgb 10.8, plts 160K - CrCl~25 ml/min - no complications of therapy, RN confirmed drip rate -HL 0.17(subtherapeutic) @ 1050 Units/hr  Goal of Therapy:  Heparin level 0.3-0.7 units/ml Monitor platelets by anticoagulation protocol: Yes   Plan:   - Heparin 2000 unit IV bolus then increase heparin infusion to 1350 units/hr. - Check heparin level 8 hours - Daily heparin level and CBC while on heparin infusion.  Dolly Rias RPh 03/05/2015, 1:42 PM Pager [phone]

## 2015-03-05 NOTE — Progress Notes (Addendum)
[facility] PROGRESS NOTE  Harold [name] ZHG:[id number] DOB: Dec 27, 1928 DOA: 03/04/2015 PCP: Garret Reddish, MD  Assessment/Plan:  Community acquired pneumonia -Extensive right middle and lower lobe pneumonia noted -Continue IV ceftriaxone and azithromycin Day 2 -Follow-up blood cultures, FU urine Legionella and pneumococcal antigen negative -SLP evaluation to rule out aspiration pending -will need FU imaging in 4-6weeks    NSTEMI/Elevated Troponin -EKG with TWI in inferior leads, new from prior -Continue Aspirin, metoprolol, continue statin, IV heparin -plavix added per Cards -FU 2-D echocardiogram -Cardiology following -cycle troponin till trends down  CAD/CABG/PCI -see above, continue aspirin and statin and beta blocker added as above -Continue Imdur for now   DIASTOLIC HEART FAILURE, CHRONIC -Compensated, Lasix on hold    AKI on CKD stage III.  -Stable at baseline, stop NSAIDs -gentle IVF today   Parkinson's disease  -Stable with some memory deficits per family for the last year    H/o AAA repair  DVT proph: lovenox  Code Status:Full Code Family Communication: none at bedside Disposition Plan: inpatient  Consultants:  Cards  HPI/Subjective: Feels ok, breathing better, no chest pain  Objective: Filed Vitals:   03/05/15 0503  BP: 133/70  Pulse: 76  Temp: 97.9 F (36.6 C)  Resp: 16    Intake/Output Summary (Last 24 hours) at 03/05/15 1018 Last data filed at 03/05/15 0820  Gross per 24 hour  Intake 258.48 ml  Output    150 ml  Net 108.48 ml   Filed Weights   03/04/15 1736  Weight: 76.1 kg (167 lb 12.3 oz)    Exam:   General:  AAOx3, some confusion  Cardiovascular: S1S2/RRR  Respiratory: diminished BS at bases  Abdomen: soft, NT, BS present  Musculoskeletal: no edema c/c  Data Reviewed: Basic Metabolic Panel:  Recent Labs Lab 03/04/15 1359 03/05/15 0255  NA 140 136  K 3.9 4.6  CL 106 103  CO2 25 26   GLUCOSE 94 78  BUN 31* 37*  CREATININE 2.00* 2.33*  CALCIUM 8.7* 8.3*   Liver Function Tests:  Recent Labs Lab 03/04/15 1359  AST 21  ALT 8*  ALKPHOS 64  BILITOT 1.1  PROT 5.9*  ALBUMIN 3.2*   No results for input(s): LIPASE, AMYLASE in the last 168 hours. No results for input(s): AMMONIA in the last 168 hours. CBC:  Recent Labs Lab 03/04/15 1359 03/05/15 0255  WBC 10.5 17.4*  NEUTROABS 9.3*  --   HGB 12.5* 10.8*  HCT 38.8* 33.7*  MCV 84.7 83.0  PLT 176 160   Cardiac Enzymes:  Recent Labs Lab 03/04/15 1655 03/04/15 2220 03/05/15 0255  TROPONINI 3.81* 6.75* 8.43*   BNP (last 3 results) No results for input(s): BNP in the last 8760 hours.  ProBNP (last 3 results) No results for input(s): PROBNP in the last 8760 hours.  CBG: No results for input(s): GLUCAP in the last 168 hours.  Recent Results (from the past 240 hour(s))  Culture, blood (routine x 2)     Status: None (Preliminary result)   Collection Time: 03/04/15  1:55 PM  Result Value Ref Range Status   Specimen Description BLOOD LEFT ANTECUBITAL  Final   Special Requests IN PEDIATRIC BOTTLE 5CC  Final   Culture   Final    NO GROWTH < 24 HOURS Performed at [facility]    Report Status PENDING  Incomplete  Culture, blood (routine x 2)     Status: None (Preliminary result)   Collection Time: 03/04/15  2:51 PM  Result Value Ref Range Status   Specimen Description BLOOD RIGHT ANTECUBITAL  Final   Special Requests BOTTLES DRAWN AEROBIC AND ANAEROBIC 5CC EACH  Final   Culture   Final    NO GROWTH < 24 HOURS Performed at [facility]    Report Status PENDING  Incomplete     Studies: Dg Chest 2 View  03/04/2015  CLINICAL DATA:  Shortness of breath with cough and fever EXAM: CHEST  2 VIEW COMPARISON:  October 02, 2013 FINDINGS: There is extensive right middle and right lower lobe airspace consolidation consistent with pneumonia. Left lung is clear. Heart size and pulmonary vascularity  are normal. No adenopathy. Patient is status post coronary artery bypass grafting. There is degenerative change in both shoulders and in the thoracic spine. IMPRESSION: Extensive pneumonia in the right middle and lower lobes. Left lung clear. Followup PA and lateral chest radiographs recommended in 3-4 weeks following trial of antibiotic therapy to ensure resolution and exclude underlying malignancy. Electronically Signed   By: Lowella Grip III M.D.   On: 03/04/2015 14:47    Scheduled Meds: . aspirin EC  325 mg Oral Daily  . azithromycin (ZITHROMAX) 500 MG IVPB  500 mg Intravenous Q24H  . buPROPion  150 mg Oral BID  . carvedilol  3.125 mg Oral BID WC  . cefTRIAXone (ROCEPHIN)  IV  1 g Intravenous Q24H  . clopidogrel  75 mg Oral Daily  . fluticasone  2 spray Each Nare Daily  . isosorbide mononitrate  60 mg Oral Daily  . simvastatin  20 mg Oral Daily   Continuous Infusions: . sodium chloride    . heparin 1,050 Units/hr (03/05/15 0417)   Antibiotics Given (last 72 hours)    None      Principal Problem:   Community acquired pneumonia Active Problems:   DIASTOLIC HEART FAILURE, CHRONIC   CAD (coronary artery disease)   CKD stage III. GFR 30-40   Parkinson's disease ([place])   Sepsis ([place])   CAP (community acquired pneumonia)   Elevated troponin   Pneumonia    Time spent: 16min    Will Schier  [facility] Pager [phone]. If 7PM-7AM, please contact night-coverage at [email or web address], password [facility] 03/05/2015, 10:18 AM  LOS: 1 day

## 2015-03-05 NOTE — Progress Notes (Signed)
Utilization Review Completed.Harold [name] T11/08/2014  

## 2015-03-06 ENCOUNTER — Inpatient Hospital Stay (HOSPITAL_COMMUNITY): Payer: Medicare Other

## 2015-03-06 DIAGNOSIS — A419 Sepsis, unspecified organism: Principal | ICD-10-CM

## 2015-03-06 DIAGNOSIS — J189 Pneumonia, unspecified organism: Secondary | ICD-10-CM

## 2015-03-06 DIAGNOSIS — J9620 Acute and chronic respiratory failure, unspecified whether with hypoxia or hypercapnia: Secondary | ICD-10-CM

## 2015-03-06 LAB — BASIC METABOLIC PANEL
Anion gap: 9 (ref 5–15)
BUN: 35 mg/dL — AB (ref 6–20)
CO2: 22 mmol/L (ref 22–32)
CREATININE: 2.09 mg/dL — AB (ref 0.61–1.24)
Calcium: 8.4 mg/dL — ABNORMAL LOW (ref 8.9–10.3)
Chloride: 104 mmol/L (ref 101–111)
GFR calc Af Amer: 32 mL/min — ABNORMAL LOW (ref >60)
GFR calc non Af Amer: 28 mL/min — ABNORMAL LOW (ref >60)
Glucose, Bld: 87 mg/dL (ref 65–99)
POTASSIUM: 3.8 mmol/L (ref 3.5–5.1)
SODIUM: 135 mmol/L (ref 135–145)

## 2015-03-06 LAB — BLOOD GAS, ARTERIAL
ACID-BASE DEFICIT: 5.1 mmol/L — AB (ref 0.0–2.0)
Acid-base deficit: 2.9 mmol/L — ABNORMAL HIGH (ref 0.0–2.0)
Bicarbonate: 22.2 mEq/L (ref 20.0–24.0)
Bicarbonate: 21.4 mEq/L (ref 20.0–24.0)
DRAWN BY: 105521
DRAWN BY: 331471
FIO2: 1
FIO2: 1
O2 SAT: 99.6 %
O2 Saturation: 90.8 %
PATIENT TEMPERATURE: 98.6
PATIENT TEMPERATURE: 98.6
PCO2 ART: 53.1 mmHg — AB (ref 35.0–45.0)
PCO2 ART: 37.8 mmHg (ref 35.0–45.0)
PEEP: 5 cmH2O
PH ART: 7.244 — AB (ref 7.350–7.450)
PH ART: 7.372 (ref 7.350–7.450)
PO2 ART: 75.5 mmHg — AB (ref 80.0–100.0)
RATE: 20 resp/min
TCO2: 20.7 mmol/L (ref 0–100)
TCO2: 20 mmol/L (ref 0–100)
VT: 580 mL
pO2, Arterial: 247 mmHg — ABNORMAL HIGH (ref 80.0–100.0)

## 2015-03-06 LAB — CBC
HEMATOCRIT: 33 % — AB (ref 39.0–52.0)
HEMOGLOBIN: 10.7 g/dL — AB (ref 13.0–17.0)
MCH: 27.2 pg (ref 26.0–34.0)
MCHC: 32.4 g/dL (ref 30.0–36.0)
MCV: 84 fL (ref 78.0–100.0)
Platelets: 162 10*3/uL (ref 150–400)
RBC: 3.93 MIL/uL — AB (ref 4.22–5.81)
RDW: 16.5 % — ABNORMAL HIGH (ref 11.5–15.5)
WBC: 13.8 10*3/uL — ABNORMAL HIGH (ref 4.0–10.5)

## 2015-03-06 LAB — TROPONIN I: TROPONIN I: 4.29 ng/mL — AB (ref <0.031)

## 2015-03-06 LAB — HEPARIN LEVEL (UNFRACTIONATED): HEPARIN UNFRACTIONATED: 0.3 [IU]/mL (ref 0.30–0.70)

## 2015-03-06 LAB — MRSA PCR SCREENING: MRSA BY PCR: NEGATIVE

## 2015-03-06 MED ORDER — ANTISEPTIC ORAL RINSE SOLUTION (CORINZ)
7.0000 mL | OROMUCOSAL | Status: DC
  Administered 2015-03-06 – 2015-03-07 (×9): 7 mL via OROMUCOSAL

## 2015-03-06 MED ORDER — FENTANYL CITRATE (PF) 100 MCG/2ML IJ SOLN
100.0000 ug | INTRAMUSCULAR | Status: DC | PRN
  Administered 2015-03-06 – 2015-03-07 (×2): 50 ug via INTRAVENOUS
  Filled 2015-03-06 (×2): qty 2

## 2015-03-06 MED ORDER — FUROSEMIDE 10 MG/ML IJ SOLN
60.0000 mg | Freq: Two times a day (BID) | INTRAMUSCULAR | Status: DC
  Administered 2015-03-06: 60 mg via INTRAVENOUS
  Filled 2015-03-06: qty 6

## 2015-03-06 MED ORDER — ALBUTEROL SULFATE (2.5 MG/3ML) 0.083% IN NEBU: 2.5000 mg | INHALATION_SOLUTION | RESPIRATORY_TRACT | Status: DC | PRN

## 2015-03-06 MED ORDER — VANCOMYCIN HCL IN DEXTROSE 750-5 MG/150ML-% IV SOLN
750.0000 mg | INTRAVENOUS | Status: DC
  Administered 2015-03-07: 750 mg via INTRAVENOUS
  Filled 2015-03-06 (×2): qty 150

## 2015-03-06 MED ORDER — SENNOSIDES 8.8 MG/5ML PO SYRP
5.0000 mL | ORAL_SOLUTION | Freq: Two times a day (BID) | ORAL | Status: DC | PRN
  Filled 2015-03-06: qty 5

## 2015-03-06 MED ORDER — MORPHINE SULFATE (PF) 2 MG/ML IV SOLN
INTRAVENOUS | Status: AC
  Filled 2015-03-06: qty 1

## 2015-03-06 MED ORDER — FENTANYL CITRATE (PF) 100 MCG/2ML IJ SOLN
INTRAMUSCULAR | Status: AC
  Administered 2015-03-06: 100 ug
  Filled 2015-03-06: qty 2

## 2015-03-06 MED ORDER — VANCOMYCIN HCL 10 G IV SOLR
1500.0000 mg | Freq: Once | INTRAVENOUS | Status: AC
  Administered 2015-03-06: 1500 mg via INTRAVENOUS
  Filled 2015-03-06: qty 1500

## 2015-03-06 MED ORDER — SIMVASTATIN 20 MG PO TABS
20.0000 mg | ORAL_TABLET | Freq: Every day | ORAL | Status: DC
  Administered 2015-03-08 – 2015-03-13 (×6): 20 mg
  Filled 2015-03-06: qty 1
  Filled 2015-03-06: qty 2
  Filled 2015-03-06 (×6): qty 1

## 2015-03-06 MED ORDER — MORPHINE SULFATE (PF) 2 MG/ML IV SOLN
1.0000 mg | Freq: Once | INTRAVENOUS | Status: AC
  Administered 2015-03-06: 1 mg via INTRAVENOUS

## 2015-03-06 MED ORDER — ETOMIDATE 2 MG/ML IV SOLN
0.3000 mg/kg | Freq: Once | INTRAVENOUS | Status: AC
  Administered 2015-03-06: 10 mg via INTRAVENOUS

## 2015-03-06 MED ORDER — FENTANYL CITRATE (PF) 100 MCG/2ML IJ SOLN
50.0000 ug | INTRAMUSCULAR | Status: DC | PRN
Start: 2015-03-06 — End: 2015-03-06
  Administered 2015-03-06: 50 ug via INTRAVENOUS
  Filled 2015-03-06: qty 2

## 2015-03-06 MED ORDER — MIDAZOLAM HCL 2 MG/2ML IJ SOLN
INTRAMUSCULAR | Status: AC
  Administered 2015-03-06: 2 mg
  Filled 2015-03-06: qty 2

## 2015-03-06 MED ORDER — PIPERACILLIN-TAZOBACTAM 3.375 G IVPB
3.3750 g | Freq: Three times a day (TID) | INTRAVENOUS | Status: DC
  Administered 2015-03-06 – 2015-03-09 (×9): 3.375 g via INTRAVENOUS
  Filled 2015-03-06 (×9): qty 50

## 2015-03-06 MED ORDER — MIDAZOLAM HCL 2 MG/2ML IJ SOLN
1.0000 mg | INTRAMUSCULAR | Status: DC | PRN
  Administered 2015-03-06 – 2015-03-07 (×3): 1 mg via INTRAVENOUS
  Filled 2015-03-06 (×3): qty 2

## 2015-03-06 MED ORDER — BISACODYL 10 MG RE SUPP: 10.0000 mg | Freq: Every day | RECTAL | Status: DC | PRN

## 2015-03-06 MED ORDER — SODIUM CHLORIDE 0.9 % IV BOLUS (SEPSIS)
500.0000 mL | Freq: Once | INTRAVENOUS | Status: AC
Start: 2015-03-06 — End: 2015-03-06
  Administered 2015-03-06: 500 mL via INTRAVENOUS

## 2015-03-06 MED ORDER — PANTOPRAZOLE SODIUM 40 MG PO PACK
40.0000 mg | PACK | Freq: Every day | ORAL | Status: DC
  Administered 2015-03-06 – 2015-03-14 (×7): 40 mg
  Filled 2015-03-06 (×9): qty 20

## 2015-03-06 MED ORDER — CLOPIDOGREL BISULFATE 75 MG PO TABS
75.0000 mg | ORAL_TABLET | Freq: Every day | ORAL | Status: DC
  Administered 2015-03-07 – 2015-03-14 (×7): 75 mg
  Filled 2015-03-06 (×7): qty 1

## 2015-03-06 MED ORDER — SODIUM CHLORIDE 0.9 % IV SOLN
INTRAVENOUS | Status: DC
  Administered 2015-03-06: 1000 mL via INTRAVENOUS
  Administered 2015-03-07: 06:00:00 via INTRAVENOUS

## 2015-03-06 MED ORDER — ACETAMINOPHEN 160 MG/5ML PO SOLN: 650.0000 mg | Freq: Four times a day (QID) | ORAL | Status: DC | PRN

## 2015-03-06 MED ORDER — CHLORHEXIDINE GLUCONATE 0.12 % MT SOLN
15.0000 mL | Freq: Two times a day (BID) | OROMUCOSAL | Status: DC
  Administered 2015-03-06 – 2015-03-14 (×12): 15 mL via OROMUCOSAL
  Filled 2015-03-06 (×5): qty 15

## 2015-03-06 MED ORDER — SODIUM CHLORIDE 0.9 % IV BOLUS (SEPSIS): 1000.0000 mL | Freq: Once | INTRAVENOUS | Status: DC

## 2015-03-06 NOTE — Progress Notes (Signed)
ANTICOAGULATION CONSULT NOTE - follow up  Pharmacy Consult for IV Heparin Indication: chest pain/ACS  No Known Allergies  Patient Measurements: Height: 5' 10.5" (179.1 cm) Weight: 172 lb 6.4 oz (78.2 kg) IBW/kg (Calculated) : 74.15 Heparin Dosing Weight: using total body weight  Vital Signs: Temp: 98.4 F (36.9 C) (11/06 0403) Temp Source: Oral (11/06 0403) BP: 152/68 mmHg (11/06 0403) Pulse Rate: 83 (11/06 0403)  Labs:  Recent Labs  03/04/15 1359  03/04/15 1805  03/05/15 0255 03/05/15 1215 03/05/15 2202 03/06/15 0320  HGB 12.5*  --   --   --  10.8*  --   --  10.7*  HCT 38.8*  --   --   --  33.7*  --   --  33.0*  PLT 176  --   --   --  160  --   --  162  APTT  --   --  36  --   --   --   --   --   LABPROT  --   --  16.5*  --   --   --   --   --   INR  --   --  1.32  --   --   --   --   --   HEPARINUNFRC  --   --   --   < > 0.28* 0.17* 0.35 0.30  CREATININE 2.00*  --   --   --  2.33*  --   --  2.09*  TROPONINI  --   < >  --   < > 8.43* 6.19* 4.75* 4.29*  < > = values in this interval not displayed.  Estimated Creatinine Clearance: 28.1 mL/min (by C-G formula based on Cr of 2.09).   Medical History: Past Medical History  Diagnosis Date  . CAD (coronary artery disease)     a. no known MImultiple PCIs;  b. S/P CABG;  c. all vein grafts occluded, LIMA patent by cath 08/2008.  Marland Kitchen Gout   . Macular degeneration     followed at [facility]  . PVD (peripheral vascular disease) ([place])   . OSA (obstructive sleep apnea)     using CPAP  . Diastolic heart failure     a. 08/2013 Echo: EF 60-65%, mild LVH, Gr 2 DD, mod MR.  Marland Kitchen Hypertension   . Hyperlipidemia   . Depression     symptoms  . CKD (chronic kidney disease), stage IV ([place])   . Renal adenoma     hx  . Nephrolithiasis     hx   . Hx of skin cancer, basal cell   . AAA (abdominal aortic aneurysm) ([place]) 2002    3.2 cm, with repair  . History of tobacco use     Remote (same for alcohol use)  . Claudication [facility])     a.  08/2013 ABI's R: 0.56, L 0.60 - f/u with Dr. Gwenlyn Found scheduled.  . Bradycardia     a. 08/2013  . Hypotension     a. 08/2013  . CKD (chronic kidney disease) stage 4, GFR 15-29 ml/min (HCC) 10/04/2013  . OSA on CPAP 10/04/2013  . Lower extremity ulceration (HCC)     right medial malleolus, healed  . TESTOSTERONE DEFICIENCY 11/25/2006    Qualifier: Diagnosis of  By: Teryl Lucy RN, Darnell Level   . Solitary pulmonary nodule 04/26/2009    04/28/09 Ct- No worrisome pulmonary nodules.       Assessment: 61 yoM with CAD s/p bypass  surgery in 1998 with PCI, normal EF, HTN, HLD, prior AAA repair, PVD, CKD-IV admitted with pneumonia.  Troponin drawn in ED mildly elevated.  Pt denies chest pain.  Repeat troponin this evening increased significantly.  Starting heparin infusion for ACS.  Pt has not received any anticoagulants this admission.  - Hgb 10.7, plts 162K - CrCl~28 ml/min - no complications of therapy noted -HL = 0.3 on heparin @ 1350 units/hr (very low end of therapeutic)  Goal of Therapy:  Heparin level 0.3-0.7 units/ml Monitor platelets by anticoagulation protocol: Yes   Plan:  - increase heparin infusion @ 1400 units/hr. - Daily heparin level and CBC while on heparin infusion.  Dolly Rias RPh 03/06/2015, 9:03 AM Pager [phone]

## 2015-03-06 NOTE — Procedures (Signed)
Intubation Procedure Note Harold [name] [id number] 1928-06-08  Procedure: Intubation Indications: Respiratory insufficiency  Procedure Details Consent: Risks of procedure as well as the alternatives and risks of each were explained to the (patient/caregiver).  Consent for procedure obtained. Time Out: Verified patient identification, verified procedure, site/side was marked, verified correct patient position, special equipment/implants available, medications/allergies/relevent history reviewed, required imaging and test results available.  Performed  Maximum sterile technique was used including gloves and hand hygiene.  MAC and 3  Given 2 mg versed, 100 mcg fentanyl, 10 mg etomidate.  Inserted #8 ETT to 24 cm at lip using glidescope.  Confirmed with CO2 detector and ausculation.  Evaluation Hemodynamic Status: BP stable throughout; O2 sats: transiently fell during during procedure Patient's Current Condition: stable Complications: No apparent complications Patient did tolerate procedure well. Chest X-ray ordered to verify placement.  CXR: pending.   Harold Mires, MD [facility] Pulmonary/Critical Care 03/06/2015, 12:54 PM Pager:  [phone] After 3pm call: [phone]

## 2015-03-06 NOTE — Progress Notes (Signed)
ANTIBIOTIC CONSULT NOTE - INITIAL   Pharmacy Consult for vancomycin/zosyn Indication: pneumonia  No Known Allergies  Patient Measurements: Height: 5' 10.5" (179.1 cm) Weight: 172 lb 6.4 oz (78.2 kg) IBW/kg (Calculated) : 74.15 Adjusted Body Weight:   Vital Signs: Temp: 99 F (37.2 C) (11/06 0920) Temp Source: Oral (11/06 0920) BP: 191/100 mmHg (11/06 0920) Pulse Rate: 109 (11/06 0920) Intake/Output from previous day: 11/05 0701 - 11/06 0700 In: 1765.5 [P.O.:480; I.V.:985.5; IV Piggyback:300] Out: 1200 [Urine:1200] Intake/Output from this shift:    Labs:  Recent Labs  03/04/15 1359 03/05/15 0255 03/06/15 0320  WBC 10.5 17.4* 13.8*  HGB 12.5* 10.8* 10.7*  PLT 176 160 162  CREATININE 2.00* 2.33* 2.09*   Estimated Creatinine Clearance: 28.1 mL/min (by C-G formula based on Cr of 2.09). No results for input(s): VANCOTROUGH, VANCOPEAK, VANCORANDOM, GENTTROUGH, GENTPEAK, GENTRANDOM, TOBRATROUGH, TOBRAPEAK, TOBRARND, AMIKACINPEAK, AMIKACINTROU, AMIKACIN in the last 72 hours.   Microbiology: Recent Results (from the past 720 hour(s))  Culture, blood (routine x 2)     Status: None (Preliminary result)   Collection Time: 03/04/15  1:55 PM  Result Value Ref Range Status   Specimen Description BLOOD LEFT ANTECUBITAL  Final   Special Requests IN PEDIATRIC BOTTLE 5CC  Final   Culture   Final    NO GROWTH 2 DAYS Performed at [facility]    Report Status PENDING  Incomplete  Culture, blood (routine x 2)     Status: None (Preliminary result)   Collection Time: 03/04/15  2:51 PM  Result Value Ref Range Status   Specimen Description BLOOD RIGHT ANTECUBITAL  Final   Special Requests BOTTLES DRAWN AEROBIC AND ANAEROBIC 5CC EACH  Final   Culture   Final    NO GROWTH 2 DAYS Performed at [facility]    Report Status PENDING  Incomplete  Urine culture     Status: None (Preliminary result)   Collection Time: 03/04/15  6:15 PM  Result Value Ref Range Status   Specimen Description URINE, RANDOM  Final   Special Requests NONE  Final   Culture   Final    TOO YOUNG TO READ Performed at [facility]    Report Status PENDING  Incomplete    Medical History: Past Medical History  Diagnosis Date  . CAD (coronary artery disease)     a. no known MImultiple PCIs;  b. S/P CABG;  c. all vein grafts occluded, LIMA patent by cath 08/2008.  Marland Kitchen Gout   . Macular degeneration     [facility]ed at [facility]  . PVD (peripheral vascular disease) ([place])   . OSA (obstructive sleep apnea)     using CPAP  . Diastolic heart failure     a. 08/2013 Echo: EF 60-65%, mild LVH, Gr 2 DD, mod MR.  Marland Kitchen Hypertension   . Hyperlipidemia   . Depression     symptoms  . CKD (chronic kidney disease), stage IV ([place])   . Renal adenoma     hx  . Nephrolithiasis     hx   . Hx of skin cancer, basal cell   . AAA (abdominal aortic aneurysm) ([place]) 2002    3.2 cm, with repair  . History of tobacco use     Remote (same for alcohol use)  . Claudication [facility])     a. 08/2013 ABI's R: 0.56, L 0.60 - f/u with Dr. Gwenlyn Found scheduled.  . Bradycardia     a. 08/2013  . Hypotension     a.  08/2013  . CKD (chronic kidney disease) stage 4, GFR 15-29 ml/min (HCC) 10/04/2013  . OSA on CPAP 10/04/2013  . Lower extremity ulceration (HCC)     right medial malleolus, healed  . TESTOSTERONE DEFICIENCY 11/25/2006    Qualifier: Diagnosis of  By: Teryl Lucy RN, Darnell Level   . Solitary pulmonary nodule 04/26/2009    04/28/09 Ct- No worrisome pulmonary nodules.     Assessment: 8 YOM presenting 11/4 to ED with shortness of breath.  Known to pharmacy for dosing heparin gtt for NSTEMI. Decompensated 11/6 requiring intubation and transfer to ICU.  Pharmacy asked to dose vancomycin and zosyn, previously on ceftriaxone and azithromycin. 11/6 CXR worsening aeration of both lungs.   11/5 >> ceftriaxone  >> 11/6 11/5 >> azithromcyin  >> 11/6 11/6 >> vancomycin  >> 11/6 >> pip/tazo  >>    11/4 blood: NGTD / urine:  /  sputum: ordered 11/4 strep Ag: neg 11/4 legionella Ag: neg   Renal: AKI on CKD, CrCl < 64ml/min WBC mildly elevated afebrile  Dose changes/levels:  Goal of Therapy:  Vancomycin trough level 15-20 mcg/ml  Plan:    Vancomycin 1500mg  IV x 1 then 750mg  IV q24h  Check levels as indicated, watch renal function closely  Zosyn 3.375gm IV q8h over 4h infusion  Doreene Eland, PharmD, BCPS.   Pager: [phone] 03/06/2015 12:16 PM

## 2015-03-06 NOTE — Progress Notes (Signed)
Pt, at around 0900, noted with difficulty breathing/respiratory distress when MD rounded on pt.  Pt received 60 mg lasix IV, prn albuterol and 1 mg morphine IV per MD's order.  Foley catheter was also placed per MD's order. Labs, to include blood gases, were done. Pt later transferred to ICU as his condition deteriorated and warranted more intense  care.  Transferred to rm [id number]. Vwilliams,rn.

## 2015-03-06 NOTE — Consult Note (Signed)
PULMONARY / CRITICAL CARE MEDICINE   Name: Harold [name] MRN: [id number] DOB: 07-09-1928    ADMISSION DATE:  03/04/2015 CONSULTATION DATE:  03/06/2015  REFERRING MD :  Dr. Broadus John  CHIEF COMPLAINT:  Short of breath  INITIAL PRESENTATION:  81 yo male presented with progressive dyspnea and cough with yellow sputum 2nd to PNA.  Developed respiratory failure and transferred to ICU.  STUDIES:  11/05 Echo >> EF 50 to 55%, mod LVH, grade 2 diastolic dysfx, mod MR, PAS 42 mmHg  SIGNIFICANT EVENTS: 11/04 Admit, cardiology consult, heparin gtt 11/06 transfer to ICU   HISTORY OF PRESENT ILLNESS:   81 yo male presented with progressive dyspnea and cough with yellow sputum 2nd to PNA.  Developed respiratory failure and transferred to ICU.  Pt not able to provide any additional history.  PAST MEDICAL HISTORY :  He  has a past medical history of CAD (coronary artery disease); Gout; Macular degeneration; PVD (peripheral vascular disease) ([place]); OSA (obstructive sleep apnea); Diastolic heart failure; Hypertension; Hyperlipidemia; Depression; CKD (chronic kidney disease), stage IV ([place]); Renal adenoma; Nephrolithiasis; skin cancer, basal cell; AAA (abdominal aortic aneurysm) ([place]) (2002); History of tobacco use; Claudication ([place]); Bradycardia; Hypotension; CKD (chronic kidney disease) stage 4, GFR 15-29 ml/min (HCC) (10/04/2013); OSA on CPAP (10/04/2013); Lower extremity ulceration ([place]); TESTOSTERONE DEFICIENCY (11/25/2006); and Solitary pulmonary nodule (04/26/2009).  PAST SURGICAL HISTORY: He  has past surgical history that includes Abdominal aortic aneurysm repair; Transurethral resection of prostate; Coronary artery bypass graft (1990); Cholecystectomy; Lumbar disc surgery; Coronary stent placement; Back surgery; and Prostate surgery.   Medication Sig  amoxicillin (AMOXIL) 500 MG capsule Take four (4) capsules by mouth one prior to dental appointments  aspirin 81 MG tablet Take 81 mg by mouth  daily.  buPROPion (WELLBUTRIN XL) 150 MG 24 hr tablet TAKE ONE TABLET BY MOUTH TWICE DAILY  docusate sodium (COLACE) 100 MG capsule Take 100 mg by mouth at bedtime.  fluticasone (FLONASE) 50 MCG/ACT nasal spray USE TWO SPRAYS IN EACH NOSTRIL EVERY DAY  furosemide (LASIX) 40 MG tablet Take 1 tablet (40 mg total) by mouth daily.  isosorbide mononitrate (IMDUR) 60 MG 24 hr tablet Take 1 tablet (60 mg total) by mouth daily.  naproxen sodium (ANAPROX) 220 MG tablet Take 220-400 mg by mouth 2 (two) times daily as needed (pain).  nitroGLYCERIN (NITROSTAT) 0.4 MG SL tablet Place 0.4 mg under the tongue every 5 (five) minutes as needed for chest pain.  polyethylene glycol powder (GLYCOLAX/MIRALAX) powder Take 0.5 Containers by mouth daily as needed for mild constipation.   potassium chloride (K-DUR) 10 MEQ tablet Take 2 tablets (20 mEq total) by mouth daily. Patient taking differently: Take 10 mEq by mouth daily.   simvastatin (ZOCOR) 20 MG tablet Take 1 tablet (20 mg total) by mouth daily.  triamcinolone cream (KENALOG) 0.1 % APPLY TO AFFECTED AREA 2 TIMES DAILY UNTIL HEALED  FLUZONE HIGH-DOSE 0.5 ML SUSY ADM 0.5ML IM UTD   No Known Allergies  FAMILY HISTORY:  His family history includes Emphysema in his father; Heart attack in his father; Heart disease in his paternal uncle; Hypertension in his father.  SOCIAL HISTORY: He  reports that he quit smoking about 26 years ago. He has never used smokeless tobacco. He reports that he drinks alcohol. He reports that he does not use illicit drugs.  REVIEW OF SYSTEMS:   Unable to obtain  SUBJECTIVE:   VITAL SIGNS: Temp:  [98.1 F (36.7 C)-99 F (37.2 C)] 99 F (37.2 C) (  11/06 0920) Pulse Rate:  [81-109] 109 (11/06 0920) Resp:  [16-18] 18 (11/06 0403) BP: (130-191)/(58-100) 191/100 mmHg (11/06 0920) SpO2:  [89 %-95 %] 95 % (11/06 0920) Weight:  [172 lb 6.4 oz (78.2 kg)] 172 lb 6.4 oz (78.2 kg) (11/06 0403) HEMODYNAMICS:   VENTILATOR SETTINGS:    INTAKE / OUTPUT:  Intake/Output Summary (Last 24 hours) at 03/06/15 1146 Last data filed at 03/06/15 0500  Gross per 24 hour  Intake 1765.5 ml  Output   1050 ml  Net  715.5 ml    PHYSICAL EXAMINATION: General: using accessory muscles Neuro:  Alert, follows commands HEENT:  Non verbal, gurgling Cardiovascular:  Regular, tachycardic, 2/6 SM Lungs:  B/l diffuse crackles Abdomen:  Soft, non tender Musculoskeletal:  1+ edema Skin:  No rashes  LABS:  CBC  Recent Labs Lab 03/04/15 1359 03/05/15 0255 03/06/15 0320  WBC 10.5 17.4* 13.8*  HGB 12.5* 10.8* 10.7*  HCT 38.8* 33.7* 33.0*  PLT 176 160 162   Coag's  Recent Labs Lab 03/04/15 1805  APTT 36  INR 1.32   BMET  Recent Labs Lab 03/04/15 1359 03/05/15 0255 03/06/15 0320  NA 140 136 135  K 3.9 4.6 3.8  CL 106 103 104  CO2 25 26 22   BUN 31* 37* 35*  CREATININE 2.00* 2.33* 2.09*  GLUCOSE 94 78 87   Electrolytes  Recent Labs Lab 03/04/15 1359 03/05/15 0255 03/06/15 0320  CALCIUM 8.7* 8.3* 8.4*   Sepsis Markers  Recent Labs Lab 03/04/15 1403 03/04/15 1658  LATICACIDVEN 1.83 2.06*   ABG  Recent Labs Lab 03/06/15 0955  PHART 7.244*  PCO2ART 53.1*  PO2ART 75.5*   Liver Enzymes  Recent Labs Lab 03/04/15 1359  AST 21  ALT 8*  ALKPHOS 64  BILITOT 1.1  ALBUMIN 3.2*   Cardiac Enzymes  Recent Labs Lab 03/05/15 1215 03/05/15 2202 03/06/15 0320  TROPONINI 6.19* 4.75* 4.29*   Glucose No results for input(s): GLUCAP in the last 168 hours.  Imaging Dg Chest Port 1 View  03/06/2015  CLINICAL DATA:  Shortness of breath. EXAM: PORTABLE CHEST 1 VIEW COMPARISON:  03/04/2015 FINDINGS: Previous median sternotomy and CABG procedure. There is cardiac enlargement. Interval worsening aeration to the entire right lung with new progressive airspace opacities within the left midlung and left base. IMPRESSION: 1. Worsening aeration of both lungs compared with previous exam. Electronically Signed    By: Kerby Moors M.D.   On: 03/06/2015 10:09     ASSESSMENT / PLAN:  PULMONARY ETT 11/06 >> A: Acute hypoxic/hypercapnic respiratory failure 2nd to pneumonia. Hx of OSA. P:   Intubate F/u CXR, ABG  CARDIOVASCULAR A:  Sepsis 2nd to PNA. NSTEMI. Hx of CAD s/p CABG. Acute on chronic diastolic CHF. Hx of HTN, HLD. P:  Hold lasix, imdur, coreg Continue ASA, zocor, plavix Heparin gtt per cardiology  RENAL A:   CKD stage IV. Lactic acidosis. P:   F/u lactic acid F/u BMET  GASTROINTESTINAL A:   Dysphagia. Nutrition. P:   Tube feeds Protonix for SUP  HEMATOLOGIC A:   Anemia of critical illness. P:  F/u CBC  INFECTIOUS A:   Sepsis from pneumonia. P:   Day 3 of Abx, change to vancomycin, zosyn 11/06 F/u procalcitonin  Urine 11/04 >> Blood 11/04 >> Sputum 11/06 >>  ENDOCRINE A:   No acute issues. P:   Monitor blood sugar on BMET  NEUROLOGIC A:   Hx of Depression. P:   RASS goal: -1 PAD protocol while  on vent Hold bupropion  CC time 43 minutes.  [name], MD [facility] Pulmonary/Critical Care 03/06/2015, 12:08 PM Pager:  [phone] After 3pm call: [phone]

## 2015-03-06 NOTE — Progress Notes (Signed)
Patient ID: Harold [name], male   DOB: [birth year], 81 y.o.   MRN: [id number]    Subjective:  Denies SSCP, palpitations or Dyspnea Lots of stress.  [place] and he is hear alone  Objective:  Filed Vitals:   03/05/15 1623 03/06/15 0000 03/06/15 0022 03/06/15 0403  BP: 130/66  150/58 152/68  Pulse: 82  81 83  Temp:   98.1 F (36.7 C) 98.4 F (36.9 C)  TempSrc:   Oral Oral  Resp:   16 18  Height:      Weight:    78.2 kg (172 lb 6.4 oz)  SpO2:  89% 95% 91%    Intake/Output from previous day:  Intake/Output Summary (Last 24 hours) at 03/06/15 0750 Last data filed at 03/06/15 0500  Gross per 24 hour  Intake 1765.5 ml  Output   1200 ml  Net  565.5 ml    Physical Exam: Affect appropriate Chronically ill white male  HEENT: legally blind MD Neck supple with no adenopathy JVP normal no bruits no thyromegaly Lungsbilateral rhonchi  Exp wheezing  and good diaphragmatic motion Heart:  S1/S2 SEM murmur, no rub, gallop or click PMI normal Abdomen: benighn, BS positve, no tenderness, no AAA no bruit.  No HSM or HJR Distal pulses intact with no bruits No edema Neuro non-focal Skin warm and dry No muscular weakness   Lab Results: Basic Metabolic Panel:  Recent Labs  03/05/15 0255 03/06/15 0320  NA 136 135  K 4.6 3.8  CL 103 104  CO2 26 22  GLUCOSE 78 87  BUN 37* 35*  CREATININE 2.33* 2.09*  CALCIUM 8.3* 8.4*   Liver Function Tests:  Recent Labs  03/04/15 1359  AST 21  ALT 8*  ALKPHOS 64  BILITOT 1.1  PROT 5.9*  ALBUMIN 3.2*   CBC:  Recent Labs  03/04/15 1359 03/05/15 0255 03/06/15 0320  WBC 10.5 17.4* 13.8*  NEUTROABS 9.3*  --   --   HGB 12.5* 10.8* 10.7*  HCT 38.8* 33.7* 33.0*  MCV 84.7 83.0 84.0  PLT 176 160 162   Cardiac Enzymes:  Recent Labs  03/05/15 1215 03/05/15 2202 03/06/15 0320  TROPONINI 6.19* 4.75* 4.29*    Imaging: Dg Chest 2 View  03/04/2015  CLINICAL DATA:  Shortness of breath with cough  and fever EXAM: CHEST  2 VIEW COMPARISON:  October 02, 2013 FINDINGS: There is extensive right middle and right lower lobe airspace consolidation consistent with pneumonia. Left lung is clear. Heart size and pulmonary vascularity are normal. No adenopathy. Patient is status post coronary artery bypass grafting. There is degenerative change in both shoulders and in the thoracic spine. IMPRESSION: Extensive pneumonia in the right middle and lower lobes. Left lung clear. Followup PA and lateral chest radiographs recommended in 3-4 weeks following trial of antibiotic therapy to ensure resolution and exclude underlying malignancy. Electronically Signed   By: Lowella Grip III M.D.   On: 03/04/2015 14:47    Cardiac Studies:  ECG:  SR no acute ST changes    Telemetry:  NSR no VT  03/06/2015   Echo: EF 50-55% mild to moderate MR  Medications:   . antiseptic oral rinse  7 mL Mouth Rinse BID  . aspirin EC  325 mg Oral Daily  . azithromycin (ZITHROMAX) 500 MG IVPB  500 mg Intravenous Q24H  . buPROPion  150 mg Oral BID  . carvedilol  3.125 mg Oral BID WC  . cefTRIAXone (ROCEPHIN)  IV  1 g Intravenous Q24H  . clopidogrel  75 mg Oral Daily  . fluticasone  2 spray Each Nare Daily  . isosorbide mononitrate  60 mg Oral Daily  . simvastatin  20 mg Oral Daily     . sodium chloride 75 mL/hr at 03/05/15 1018  . heparin 1,350 Units/hr (03/05/15 1415)    Assessment/Plan:  SEMI:  tropinin up to 8 coming down   Not a geat candidate for cath given age, active multi lobar penumonia and elevated Cr.  Continue heparin and nitrates  Add plavix as he is not a redo candidate Add back low dose beta blocker and watch for recurrent bradycardia.  Suspect will need myovue to risk stratify before hospital d/c Distant CABG in 98 likely graft failure would consider lexiscan myovue before hospital d/c.  Echo with fairly normal EF 50-55% Can stop heparin in 24-48hrs   Pneumonia:  Continue antibiotics and follow CXR  ?  Aspiration may need swallowing study  Chol:  On statin    Jenkins Rouge 03/06/2015, 7:50 AM

## 2015-03-06 NOTE — Progress Notes (Addendum)
[facility] PROGRESS NOTE  Harold [name] DTO:[id number] DOB: 02-01-1929 DOA: 03/04/2015 PCP: Garret Reddish, MD  Assessment/Plan:  Acute Hypoxic Resp failure -worsened O2 requirements this am suspect worsening Pneumonia, ? CHF -Give IV lasix, ABG, repeat CXR  -Addendum: CXR significantly worsened, now on NRM, PCCM consult, transfer to ICU, ABG with resp acidosis, may need Mechanical ventilation  Community acquired pneumonia -see above, repeat CXR today -Extensive right middle and lower lobe pneumonia on admission -Day 3 of IV ceftriaxone and azithromycin -Blood cultures-NGTD, FU urine Legionella and pneumococcal antigen negative -SLP evaluation completed, mild oropharyngeal dypshagia -regular diet recommended -will need FU imaging in 4-6weeks    NSTEMI/Elevated Troponin -EKG with TWI in inferior leads, new from prior -Continue Aspirin, metoprolol, continue statin, IV heparin -plavix added per Cards -2-D echocardiogram with preserved EF -Cardiology following -stop Heparin tomorrow  CAD/CABG/PCI -see above, continue aspirin and statin and beta blocker added as above -Continue Imdur for now   Acute on Chronic DIASTOLIC HEART FAILURE -Compensated, lasix resumed   AKI on CKD stage III.  -Stable at baseline, stop NSAIDs   Parkinson's disease  -Stable with some memory deficits per family for the last year    H/o AAA repair  DVT proph: lovenox  Code Status:Full Code Family Communication: none at bedside Disposition Plan:  tarnsfer to ICU  Consultants:  Cards  HPI/Subjective: C/o dyspnea this am, cough present  Objective: Filed Vitals:   03/06/15 0920  BP: 191/100  Pulse: 109  Temp: 99 F (37.2 C)  Resp:     Intake/Output Summary (Last 24 hours) at 03/06/15 0959 Last data filed at 03/06/15 0500  Gross per 24 hour  Intake 1765.5 ml  Output   1050 ml  Net  715.5 ml   Filed Weights   03/04/15 1736 03/06/15 0403  Weight: 76.1 kg (167 lb  12.3 oz) 78.2 kg (172 lb 6.4 oz)    Exam:   General:  AAOx3, some confusion  Cardiovascular: S1S2/RRR  Respiratory: diminished BS at bases, scattered ronchi  Abdomen: soft, NT, BS present  Musculoskeletal: no edema c/c  Data Reviewed: Basic Metabolic Panel:  Recent Labs Lab 03/04/15 1359 03/05/15 0255 03/06/15 0320  NA 140 136 135  K 3.9 4.6 3.8  CL 106 103 104  CO2 25 26 22   GLUCOSE 94 78 87  BUN 31* 37* 35*  CREATININE 2.00* 2.33* 2.09*  CALCIUM 8.7* 8.3* 8.4*   Liver Function Tests:  Recent Labs Lab 03/04/15 1359  AST 21  ALT 8*  ALKPHOS 64  BILITOT 1.1  PROT 5.9*  ALBUMIN 3.2*   No results for input(s): LIPASE, AMYLASE in the last 168 hours. No results for input(s): AMMONIA in the last 168 hours. CBC:  Recent Labs Lab 03/04/15 1359 03/05/15 0255 03/06/15 0320  WBC 10.5 17.4* 13.8*  NEUTROABS 9.3*  --   --   HGB 12.5* 10.8* 10.7*  HCT 38.8* 33.7* 33.0*  MCV 84.7 83.0 84.0  PLT 176 160 162   Cardiac Enzymes:  Recent Labs Lab 03/04/15 2220 03/05/15 0255 03/05/15 1215 03/05/15 2202 03/06/15 0320  TROPONINI 6.75* 8.43* 6.19* 4.75* 4.29*   BNP (last 3 results) No results for input(s): BNP in the last 8760 hours.  ProBNP (last 3 results) No results for input(s): PROBNP in the last 8760 hours.  CBG: No results for input(s): GLUCAP in the last 168 hours.  Recent Results (from the past 240 hour(s))  Culture, blood (routine x 2)     Status: None (Preliminary  result)   Collection Time: 03/04/15  1:55 PM  Result Value Ref Range Status   Specimen Description BLOOD LEFT ANTECUBITAL  Final   Special Requests IN PEDIATRIC BOTTLE 5CC  Final   Culture   Final    NO GROWTH < 24 HOURS Performed at [facility]    Report Status PENDING  Incomplete  Culture, blood (routine x 2)     Status: None (Preliminary result)   Collection Time: 03/04/15  2:51 PM  Result Value Ref Range Status   Specimen Description BLOOD RIGHT ANTECUBITAL  Final    Special Requests BOTTLES DRAWN AEROBIC AND ANAEROBIC 5CC EACH  Final   Culture   Final    NO GROWTH < 24 HOURS Performed at [facility]    Report Status PENDING  Incomplete  Urine culture     Status: None (Preliminary result)   Collection Time: 03/04/15  6:15 PM  Result Value Ref Range Status   Specimen Description URINE, RANDOM  Final   Special Requests NONE  Final   Culture   Final    TOO YOUNG TO READ Performed at [facility]    Report Status PENDING  Incomplete     Studies: Dg Chest 2 View  03/04/2015  CLINICAL DATA:  Shortness of breath with cough and fever EXAM: CHEST  2 VIEW COMPARISON:  October 02, 2013 FINDINGS: There is extensive right middle and right lower lobe airspace consolidation consistent with pneumonia. Left lung is clear. Heart size and pulmonary vascularity are normal. No adenopathy. Patient is status post coronary artery bypass grafting. There is degenerative change in both shoulders and in the thoracic spine. IMPRESSION: Extensive pneumonia in the right middle and lower lobes. Left lung clear. Followup PA and lateral chest radiographs recommended in 3-4 weeks following trial of antibiotic therapy to ensure resolution and exclude underlying malignancy. Electronically Signed   By: Lowella Grip III M.D.   On: 03/04/2015 14:47    Scheduled Meds: . antiseptic oral rinse  7 mL Mouth Rinse BID  . aspirin EC  325 mg Oral Daily  . azithromycin (ZITHROMAX) 500 MG IVPB  500 mg Intravenous Q24H  . buPROPion  150 mg Oral BID  . carvedilol  3.125 mg Oral BID WC  . cefTRIAXone (ROCEPHIN)  IV  1 g Intravenous Q24H  . clopidogrel  75 mg Oral Daily  . fluticasone  2 spray Each Nare Daily  . furosemide  60 mg Intravenous Q12H  . isosorbide mononitrate  60 mg Oral Daily  . morphine      . simvastatin  20 mg Oral Daily   Continuous Infusions: . heparin 1,350 Units/hr (03/05/15 1415)   Antibiotics Given (last 72 hours)    Date/Time Action Medication Dose  Rate   03/05/15 1623 Given   cefTRIAXone (ROCEPHIN) 1 g in dextrose 5 % 50 mL IVPB 1 g 100 mL/hr   03/05/15 1708 Given   azithromycin (ZITHROMAX) 500 mg in dextrose 5 % 250 mL IVPB 500 mg 250 mL/hr      Principal Problem:   Community acquired pneumonia Active Problems:   DIASTOLIC HEART FAILURE, CHRONIC   CAD (coronary artery disease)   CKD stage III. GFR 30-40   Parkinson's disease ([place])   Sepsis ([place])   CAP (community acquired pneumonia)   Elevated troponin   Pneumonia    Time spent: 38min    [place] Hair  [facility] Pager [phone]. If 7PM-7AM, please contact night-coverage at [email or web address], password [facility] 03/06/2015, 9:59 AM  LOS: 2 days

## 2015-03-06 NOTE — Progress Notes (Signed)
Pt intubated in ICU by ICU MD.

## 2015-03-07 ENCOUNTER — Inpatient Hospital Stay (HOSPITAL_COMMUNITY): Payer: Medicare Other

## 2015-03-07 DIAGNOSIS — I214 Non-ST elevation (NSTEMI) myocardial infarction: Secondary | ICD-10-CM

## 2015-03-07 DIAGNOSIS — Z951 Presence of aortocoronary bypass graft: Secondary | ICD-10-CM

## 2015-03-07 DIAGNOSIS — I10 Essential (primary) hypertension: Secondary | ICD-10-CM

## 2015-03-07 DIAGNOSIS — J9601 Acute respiratory failure with hypoxia: Secondary | ICD-10-CM

## 2015-03-07 LAB — COMPREHENSIVE METABOLIC PANEL
ALBUMIN: 2.3 g/dL — AB (ref 3.5–5.0)
ALT: 9 U/L — AB (ref 17–63)
AST: 18 U/L (ref 15–41)
Alkaline Phosphatase: 45 U/L (ref 38–126)
Anion gap: 9 (ref 5–15)
BUN: 43 mg/dL — AB (ref 6–20)
CHLORIDE: 106 mmol/L (ref 101–111)
CO2: 24 mmol/L (ref 22–32)
CREATININE: 2.19 mg/dL — AB (ref 0.61–1.24)
Calcium: 8.3 mg/dL — ABNORMAL LOW (ref 8.9–10.3)
GFR calc Af Amer: 30 mL/min — ABNORMAL LOW (ref >60)
GFR calc non Af Amer: 26 mL/min — ABNORMAL LOW (ref >60)
GLUCOSE: 85 mg/dL (ref 65–99)
POTASSIUM: 3.7 mmol/L (ref 3.5–5.1)
Sodium: 139 mmol/L (ref 135–145)
Total Bilirubin: 1.1 mg/dL (ref 0.3–1.2)
Total Protein: 5.2 g/dL — ABNORMAL LOW (ref 6.5–8.1)

## 2015-03-07 LAB — CBC
HCT: 31 % — ABNORMAL LOW (ref 39.0–52.0)
Hemoglobin: 9.9 g/dL — ABNORMAL LOW (ref 13.0–17.0)
MCH: 27.3 pg (ref 26.0–34.0)
MCHC: 31.9 g/dL (ref 30.0–36.0)
MCV: 85.4 fL (ref 78.0–100.0)
PLATELETS: 166 10*3/uL (ref 150–400)
RBC: 3.63 MIL/uL — AB (ref 4.22–5.81)
RDW: 16.8 % — AB (ref 11.5–15.5)
WBC: 11.9 10*3/uL — AB (ref 4.0–10.5)

## 2015-03-07 LAB — URINE CULTURE: Culture: 80000

## 2015-03-07 LAB — HEPARIN LEVEL (UNFRACTIONATED): HEPARIN UNFRACTIONATED: 0.25 [IU]/mL — AB (ref 0.30–0.70)

## 2015-03-07 LAB — PHOSPHORUS: Phosphorus: 3.2 mg/dL (ref 2.5–4.6)

## 2015-03-07 LAB — MAGNESIUM: Magnesium: 1.9 mg/dL (ref 1.7–2.4)

## 2015-03-07 MED ORDER — ENOXAPARIN SODIUM 30 MG/0.3ML ~~LOC~~ SOLN
30.0000 mg | SUBCUTANEOUS | Status: DC
  Administered 2015-03-07 – 2015-03-10 (×4): 30 mg via SUBCUTANEOUS
  Filled 2015-03-07 (×7): qty 0.3

## 2015-03-07 MED ORDER — FUROSEMIDE 10 MG/ML IJ SOLN
40.0000 mg | Freq: Every day | INTRAMUSCULAR | Status: DC
  Administered 2015-03-07 – 2015-03-11 (×5): 40 mg via INTRAVENOUS
  Filled 2015-03-07 (×5): qty 4

## 2015-03-07 MED ORDER — ASPIRIN EC 81 MG PO TBEC
81.0000 mg | DELAYED_RELEASE_TABLET | Freq: Every day | ORAL | Status: DC
  Administered 2015-03-09 – 2015-03-14 (×6): 81 mg via ORAL
  Filled 2015-03-07 (×7): qty 1

## 2015-03-07 MED ORDER — HEPARIN (PORCINE) IN NACL 100-0.45 UNIT/ML-% IJ SOLN
1550.0000 [IU]/h | INTRAMUSCULAR | Status: DC
  Filled 2015-03-07: qty 250

## 2015-03-07 MED ORDER — CETYLPYRIDINIUM CHLORIDE 0.05 % MT LIQD
7.0000 mL | Freq: Two times a day (BID) | OROMUCOSAL | Status: DC
  Administered 2015-03-07 – 2015-03-13 (×10): 7 mL via OROMUCOSAL

## 2015-03-07 NOTE — Plan of Care (Signed)
Problem: Phase II Progression Outcomes Goal: Tolerating prescribed nutrition plan Outcome: Not Met (add Reason) Patient currently NPO waiting a barium swallow evaluation     

## 2015-03-07 NOTE — Progress Notes (Signed)
SLP received order for evaluation for 03/08/15.  Given pt h/o dysphagia, complicated hospital coarse with intubation required - would highly recommend MBS prior to po administration. MD, please order when/if you agree.  Luanna Salk, [place] [facility] SLP [phone]

## 2015-03-07 NOTE — Progress Notes (Signed)
Subjective:  No chest pain  Objective:  Vital Signs in the last 24 hours: Temp:  [97.5 F (36.4 C)-99.2 F (37.3 C)] 97.9 F (36.6 C) (11/07 0800) Pulse Rate:  [58-92] 67 (11/07 0900) Resp:  [18-27] 18 (11/07 0900) BP: (60-151)/(30-104) 133/48 mmHg (11/07 0900) SpO2:  [90 %-100 %] 95 % (11/07 0900) FiO2 (%):  [30 %-100 %] 40 % (11/07 0804) Weight:  [170 lb 6.7 oz (77.3 kg)-174 lb 9.7 oz (79.2 kg)] 174 lb 9.7 oz (79.2 kg) (11/07 0445)  Intake/Output from previous day:  Intake/Output Summary (Last 24 hours) at 03/07/15 1044 Last data filed at 03/07/15 0800  Gross per 24 hour  Intake 3109.65 ml  Output   1250 ml  Net 1859.65 ml    Physical Exam: General appearance: alert, cooperative and no distress Lungs: crackles Rt base Heart: regular rate and rhythm Abdomen: non tender Extremities: 1+ edema Skin: cool pale dry Neurologic: Grossly normal   Rate: 45-75  Rhythm: normal sinus rhythm and sinus bradycardia  Lab Results:  Recent Labs  03/06/15 0320 03/07/15 0330  WBC 13.8* 11.9*  HGB 10.7* 9.9*  PLT 162 166    Recent Labs  03/06/15 0320 03/07/15 0330  NA 135 139  K 3.8 3.7  CL 104 106  CO2 22 24  GLUCOSE 87 85  BUN 35* 43*  CREATININE 2.09* 2.19*    Recent Labs  03/05/15 2202 03/06/15 0320  TROPONINI 4.75* 4.29*    Recent Labs  03/04/15 1805  INR 1.32    Scheduled Meds: . antiseptic oral rinse  7 mL Mouth Rinse 10 times per day  . aspirin EC  325 mg Oral Daily  . chlorhexidine gluconate  15 mL Mouth Rinse BID  . clopidogrel  75 mg Per Tube Daily  . furosemide  40 mg Intravenous Daily  . pantoprazole sodium  40 mg Per Tube Daily  . piperacillin-tazobactam (ZOSYN)  IV  3.375 g Intravenous 3 times per day  . simvastatin  20 mg Per Tube Daily  . sodium chloride  1,000 mL Intravenous Once  . vancomycin  750 mg Intravenous Q24H   Continuous Infusions:  PRN Meds:.acetaminophen (TYLENOL) oral liquid 160 mg/5 mL, albuterol, bisacodyl,  nitroGLYCERIN, ondansetron (ZOFRAN) IV, polyethylene glycol, sennosides   Imaging: Dg Abd 1 View  03/06/2015  CLINICAL DATA:  81 year old male status post orogastric tube placement. EXAM: ABDOMEN - 1 VIEW COMPARISON:  No priors. FINDINGS: Enteric tube extending into the distal antral pre-pyloric region of the stomach, or proximal duodenum. Gas and stool are seen scattered throughout the colon extending to the level of the distal rectum. No pathologic distension of small bowel is noted. No gross evidence of pneumoperitoneum. Partially calcified structures projecting over the right upper quadrant of the abdomen (adjacent to the tip of the tube), suspicious for gallstones. IMPRESSION: 1. Tip of enteric tube is either in the antral pre-pyloric region of the stomach, or the proximal duodenum. 2. Probable gallstones. Electronically Signed   By: Vinnie Langton M.D.   On: 03/06/2015 13:42   Dg Chest Port 1 View  03/07/2015  CLINICAL DATA:  Healthcare associated pneumonia.  Heart failure. EXAM: PORTABLE CHEST 1 VIEW COMPARISON:  Yesterday FINDINGS: Endotracheal tube tip between the clavicular heads and carina. An orogastric tube reaches the stomach at least. Stable mild cardiomegaly with aortic tortuosity.  Status post CABG. Improvement in diffuse interstitial coarsening, especially on the left. There is persistent asymmetric airspace disease at the right base correlating with history  of pneumonia. Hazy appearance of the lower chest, suspect small layering effusions. No pneumothorax. IMPRESSION: 1. Stable positioning of endotracheal and orogastric tubes. 2. Improved pulmonary edema since yesterday. 3. Right basilar pneumonia. Electronically Signed   By: Monte Fantasia M.D.   On: 03/07/2015 05:55   Dg Chest Port 1 View  03/06/2015  CLINICAL DATA:  Intubated. EXAM: PORTABLE CHEST 1 VIEW COMPARISON:  Earlier today. FINDINGS: Interval endotracheal tube with its tip 9 mm above the carina. Nasogastric tube extending  into the stomach. Mild decrease in airspace opacity in both lungs. Possible pleural fluid on the right. Obscured heart borders with no gross change in size of the heart. Stable post CABG changes. IMPRESSION: 1. Endotracheal tube tip 9 mm above the carina. This could be retracted 4 cm for better placement. 2. Mild decrease in extensive bilateral pneumonia or alveolar edema. 3. Probable right pleural effusion. Electronically Signed   By: Claudie Revering M.D.   On: 03/06/2015 13:44   Dg Chest Port 1 View  03/06/2015  CLINICAL DATA:  Shortness of breath. EXAM: PORTABLE CHEST 1 VIEW COMPARISON:  03/04/2015 FINDINGS: Previous median sternotomy and CABG procedure. There is cardiac enlargement. Interval worsening aeration to the entire right lung with new progressive airspace opacities within the left midlung and left base. IMPRESSION: 1. Worsening aeration of both lungs compared with previous exam. Electronically Signed   By: Kerby Moors M.D.   On: 03/06/2015 10:09    Cardiac Studies:  Assessment/Plan:   Principal Problem:   Acute on chronic respiratory failure (HCC) Active Problems:   CAP (community acquired pneumonia)   Essential hypertension   DIASTOLIC HEART FAILURE, CHRONIC   Hx of CABG   CKD stage III. GFR 30-40   Sepsis (HCC)   Elevated troponin   Hyperlipidemia   Parkinson's disease ([place])   PLAN: Will review with MD. Not sure Myoview indicated if we do not plan to cath him. His EF is preserved. Check BNP. OK to stop heparin.  Decrease ASA to 81 mg, Plavix added. No beta blocker with bradycardia. No ACE or ARB with renal insufficiency. Add Norvasc for HTN, CAD- add nitrate if he has chest pain.   Kerin Ransom PA-C 03/07/2015, 10:44 AM [phone]  Agree with note written by Kerin Ransom PAC  EF preserved. Trop peaked at 8 but admitted for cough and CAP. CRI. With all SVGs occluded at cath 2010 (Patent LIMA), I'm not sure the utility of a MV stress test. Rec continued med Rx.   Quay Burow 03/07/2015 11:56 AM

## 2015-03-07 NOTE — Consult Note (Signed)
PULMONARY / CRITICAL CARE MEDICINE   Name: Harold [name] MRN: [id number] DOB: April 17, 1929    ADMISSION DATE:  03/04/2015 CONSULTATION DATE:  03/06/2015  REFERRING MD :  Dr. Broadus John  CHIEF COMPLAINT:  Short of breath  INITIAL PRESENTATION:  81 yo male presented with progressive dyspnea and cough with yellow sputum 2nd to PNA.  Developed respiratory failure and intubated 11/5 for hypercarbic resp failure  STUDIES:  11/05 Echo >> EF 50 to 55%, mod LVH, grade 2 diastolic dysfx, mod MR, PAS 42 mmHg  SIGNIFICANT EVENTS: 11/04 Admit, cardiology consult, heparin gtt 11/06 transfer to ICU  SUBJECTIVE:  Afebrile Denies CP, improved dysnea Weaning well on PS 5/5  VITAL SIGNS: Temp:  [97.5 F (36.4 C)-99.2 F (37.3 C)] 99.2 F (37.3 C) (11/07 0400) Pulse Rate:  [58-109] 58 (11/07 0730) Resp:  [19-27] 20 (11/07 0730) BP: (60-191)/(30-104) 127/49 mmHg (11/07 0730) SpO2:  [90 %-100 %] 96 % (11/07 0730) FiO2 (%):  [30 %-100 %] 40 % (11/07 0804) Weight:  [77.3 kg (170 lb 6.7 oz)-79.2 kg (174 lb 9.7 oz)] 79.2 kg (174 lb 9.7 oz) (11/07 0445) HEMODYNAMICS:   VENTILATOR SETTINGS: Vent Mode:  [-] CPAP;PSV FiO2 (%):  [30 %-100 %] 40 % Set Rate:  [20 bmp] 20 bmp Vt Set:  [580 mL] 580 mL PEEP:  [5 cmH20] 5 cmH20 Pressure Support:  [5 cmH20] 5 cmH20 Plateau Pressure:  [19 cmH20-26 cmH20] 19 cmH20 INTAKE / OUTPUT:  Intake/Output Summary (Last 24 hours) at 03/07/15 0823 Last data filed at 03/07/15 0600  Gross per 24 hour  Intake 3012.65 ml  Output   1250 ml  Net 1762.65 ml    PHYSICAL EXAMINATION: General: acutely ill, interactive Neuro:  Alert, follows commands, non focal HEENT:  Non verbal, gurgling Cardiovascular:  Regular, tachycardic, 2/6 SM Lungs:  B/l diffuse crackles Abdomen:  Soft, non tender Musculoskeletal:  1+ edema Skin:  No rashes  LABS:  CBC  Recent Labs Lab 03/05/15 0255 03/06/15 0320 03/07/15 0330  WBC 17.4* 13.8* 11.9*  HGB 10.8* 10.7* 9.9*  HCT  33.7* 33.0* 31.0*  PLT 160 162 166   Coag's  Recent Labs Lab 03/04/15 1805  APTT 36  INR 1.32   BMET  Recent Labs Lab 03/05/15 0255 03/06/15 0320 03/07/15 0330  NA 136 135 139  K 4.6 3.8 3.7  CL 103 104 106  CO2 26 22 24   BUN 37* 35* 43*  CREATININE 2.33* 2.09* 2.19*  GLUCOSE 78 87 85   Electrolytes  Recent Labs Lab 03/05/15 0255 03/06/15 0320 03/07/15 0330  CALCIUM 8.3* 8.4* 8.3*  MG  --   --  1.9  PHOS  --   --  3.2   Sepsis Markers  Recent Labs Lab 03/04/15 1403 03/04/15 1658  LATICACIDVEN 1.83 2.06*   ABG  Recent Labs Lab 03/06/15 0955 03/06/15 1420  PHART 7.244* 7.372  PCO2ART 53.1* 37.8  PO2ART 75.5* 247*   Liver Enzymes  Recent Labs Lab 03/04/15 1359 03/07/15 0330  AST 21 18  ALT 8* 9*  ALKPHOS 64 45  BILITOT 1.1 1.1  ALBUMIN 3.2* 2.3*   Cardiac Enzymes  Recent Labs Lab 03/05/15 1215 03/05/15 2202 03/06/15 0320  TROPONINI 6.19* 4.75* 4.29*   Glucose No results for input(s): GLUCAP in the last 168 hours.  Imaging Dg Abd 1 View  03/06/2015  CLINICAL DATA:  81 year old male status post orogastric tube placement. EXAM: ABDOMEN - 1 VIEW COMPARISON:  No priors. FINDINGS: Enteric tube extending into the  distal antral pre-pyloric region of the stomach, or proximal duodenum. Gas and stool are seen scattered throughout the colon extending to the level of the distal rectum. No pathologic distension of small bowel is noted. No gross evidence of pneumoperitoneum. Partially calcified structures projecting over the right upper quadrant of the abdomen (adjacent to the tip of the tube), suspicious for gallstones. IMPRESSION: 1. Tip of enteric tube is either in the antral pre-pyloric region of the stomach, or the proximal duodenum. 2. Probable gallstones. Electronically Signed   By: Vinnie Langton M.D.   On: 03/06/2015 13:42   Dg Chest Port 1 View  03/07/2015  CLINICAL DATA:  Healthcare associated pneumonia.  Heart failure. EXAM: PORTABLE  CHEST 1 VIEW COMPARISON:  Yesterday FINDINGS: Endotracheal tube tip between the clavicular heads and carina. An orogastric tube reaches the stomach at least. Stable mild cardiomegaly with aortic tortuosity.  Status post CABG. Improvement in diffuse interstitial coarsening, especially on the left. There is persistent asymmetric airspace disease at the right base correlating with history of pneumonia. Hazy appearance of the lower chest, suspect small layering effusions. No pneumothorax. IMPRESSION: 1. Stable positioning of endotracheal and orogastric tubes. 2. Improved pulmonary edema since yesterday. 3. Right basilar pneumonia. Electronically Signed   By: Monte Fantasia M.D.   On: 03/07/2015 05:55   Dg Chest Port 1 View  03/06/2015  CLINICAL DATA:  Intubated. EXAM: PORTABLE CHEST 1 VIEW COMPARISON:  Earlier today. FINDINGS: Interval endotracheal tube with its tip 9 mm above the carina. Nasogastric tube extending into the stomach. Mild decrease in airspace opacity in both lungs. Possible pleural fluid on the right. Obscured heart borders with no gross change in size of the heart. Stable post CABG changes. IMPRESSION: 1. Endotracheal tube tip 9 mm above the carina. This could be retracted 4 cm for better placement. 2. Mild decrease in extensive bilateral pneumonia or alveolar edema. 3. Probable right pleural effusion. Electronically Signed   By: Claudie Revering M.D.   On: 03/06/2015 13:44   Dg Chest Port 1 View  03/06/2015  CLINICAL DATA:  Shortness of breath. EXAM: PORTABLE CHEST 1 VIEW COMPARISON:  03/04/2015 FINDINGS: Previous median sternotomy and CABG procedure. There is cardiac enlargement. Interval worsening aeration to the entire right lung with new progressive airspace opacities within the left midlung and left base. IMPRESSION: 1. Worsening aeration of both lungs compared with previous exam. Electronically Signed   By: Kerby Moors M.D.   On: 03/06/2015 10:09     ASSESSMENT / PLAN:  PULMONARY ETT  11/06 >> A: Acute hypoxic/hypercapnic respiratory failure 2nd to pneumonia. Hx of OSA. P:   SBTs with goal extuation  CARDIOVASCULAR A:  Sepsis 2nd to PNA. NSTEMI. Hx of CAD s/p CABG. Acute on chronic diastolic CHF. Hx of HTN, HLD. P:  Hold lasix, imdur, coreg Continue ASA, zocor, plavix Not a geat candidate for cath given age, active multi lobar penumonia and elevated Cr Heparin gtt per cardiology - can stop today ?  RENAL A:   CKD stage IV. Lactic acidosis. P:   F/u BMET, repelte lytes as needed  GASTROINTESTINAL A:   Dysphagia. Nutrition. P:   Npo until swallow eval Protonix for SUP  HEMATOLOGIC A:   Anemia of critical illness. P:  F/u CBC  INFECTIOUS A:   Sepsis from pneumonia. P:   Day 3 of Abx, change to vancomycin, zosyn 11/06 F/u procalcitonin & simplify once cx back  Urine 11/04 >> Blood 11/04 >> Sputum 11/06 >>  ENDOCRINE A:  No acute issues. P:   Monitor blood sugar on BMET  NEUROLOGIC A:   Hx of Depression. P:   RASS goal: 0 PAD protocol while on vent Hold bupropion   Summary - Intubated in setting of HCAP & NSTEMI & ? Edema on CXR ,now improving, Ok to extubate  The patient is critically ill with multiple organ systems failure and requires high complexity decision making for assessment and support, frequent evaluation and titration of therapies, application of advanced monitoring technologies and extensive interpretation of multiple databases. Critical Care Time devoted to patient care services described in this note independent of APP time is 35 minutes.    Kara Mead MD. [place] Flood. [facility] Pager [phone] If no response call [phone]    03/07/2015, 8:23 AM

## 2015-03-07 NOTE — Progress Notes (Signed)
Order received to extubate. Patient placed on 100% O2 and suctioned for a moderate amount of thick brown/bloody secretions. Cuff deflated, air leak felt. Patient extubated to a 4lpm [place]. Rhaspy voice but  discernable words. HR 71,O2 sat 94%, RR 18 Bp 135/53. Will continue to monitor

## 2015-03-07 NOTE — Progress Notes (Signed)
Date: March 07, 2015 Chart reviewed for concurrent status and case management needs. Will continue to follow patient for changes and needs: Extubated and tolerating this am Velva Harman, RN, BSN, [place]   [phone]

## 2015-03-07 NOTE — Progress Notes (Signed)
ANTICOAGULATION CONSULT NOTE  Pharmacy Consult for Lovenox Indication: VTE prophylaxis  No Known Allergies  Patient Measurements: Height: 5\' 11"  (180.3 cm) Weight: 174 lb 9.7 oz (79.2 kg) IBW/kg (Calculated) : 75.3  Vital Signs: Temp: 97.9 F (36.6 C) (11/07 0800) Temp Source: Oral (11/07 0800) BP: 133/48 mmHg (11/07 0900) Pulse Rate: 67 (11/07 0900)  Labs:  Recent Labs  03/04/15 1805  03/05/15 0255 03/05/15 1215 03/05/15 2202 03/06/15 0320 03/07/15 0330  HGB  --   --  10.8*  --   --  10.7* 9.9*  HCT  --   --  33.7*  --   --  33.0* 31.0*  PLT  --   --  160  --   --  162 166  APTT 36  --   --   --   --   --   --   LABPROT 16.5*  --   --   --   --   --   --   INR 1.32  --   --   --   --   --   --   HEPARINUNFRC  --   < > 0.28* 0.17* 0.35 0.30 0.25*  CREATININE  --   --  2.33*  --   --  2.09* 2.19*  TROPONINI  --   < > 8.43* 6.19* 4.75* 4.29*  --   < > = values in this interval not displayed.  Estimated Creatinine Clearance: 27.2 mL/min (by C-G formula based on Cr of 2.19).  Medical History: Past Medical History  Diagnosis Date  . CAD (coronary artery disease)     a. no known MImultiple PCIs;  b. S/P CABG;  c. all vein grafts occluded, LIMA patent by cath 08/2008.  Marland Kitchen Gout   . Macular degeneration     [facility]  . PVD (peripheral vascular disease) ([place])   . OSA (obstructive sleep apnea)     using CPAP  . Diastolic heart failure     a. 08/2013 Echo: EF 60-65%, mild LVH, Gr 2 DD, mod MR.  Marland Kitchen Hypertension   . Hyperlipidemia   . Depression     symptoms  . CKD (chronic kidney disease), stage IV ([place])   . Renal adenoma     hx  . Nephrolithiasis     hx   . Hx of skin cancer, basal cell   . AAA (abdominal aortic aneurysm) ([place]) 2002    3.2 cm, with repair  . History of tobacco use     Remote (same for alcohol use)  . Claudication [facility])     a. 08/2013 ABI's R: 0.56, L 0.60 - f/u with Dr. Gwenlyn Found scheduled.  . Bradycardia     a. 08/2013  . Hypotension    a. 08/2013  . CKD (chronic kidney disease) stage 4, GFR 15-29 ml/min (HCC) 10/04/2013  . OSA on CPAP 10/04/2013  . Lower extremity ulceration (HCC)     right medial malleolus, healed  . TESTOSTERONE DEFICIENCY 11/25/2006    Qualifier: Diagnosis of  By: Teryl Lucy RN, Darnell Level   . Solitary pulmonary nodule 04/26/2009    04/28/09 Ct- No worrisome pulmonary nodules.     Assessment: 61 yoM with CAD s/p bypass surgery in 1998 with PCI, normal EF, HTN, HLD, prior AAA repair, PVD, CKD-IV admitted with pneumonia.  Troponin drawn in ED mildly elevated.  Pt denies chest pain.  Repeat troponins increased significantly, started Heparin infusion 11/4 for ACS.    Discontinue Heparin infusion  per Cards, begin Lovenox for VTE prophylaxis, Pharmacy to assist with dosing  SCr remains elevated, poor clearance  Goal of Therapy:  Monitor platelets by anticoagulation protocol: Yes   Plan:   Lovenox 30mg  SQ daily, begin 1 hr after Heparin infusion stopped  Monitor CBC, SCr; adjust Lovenox as needed  Minda Ditto PharmD Pager [phone] 03/07/2015, 11:05 AM

## 2015-03-07 NOTE — Progress Notes (Signed)
ANTICOAGULATION CONSULT NOTE - follow up  Pharmacy Consult for IV Heparin Indication: chest pain/ACS  No Known Allergies  Patient Measurements: Height: 5\' 11"  (180.3 cm) Weight: 174 lb 9.7 oz (79.2 kg) IBW/kg (Calculated) : 75.3 Heparin Dosing Weight: using total body weight  Vital Signs: Temp: 98.6 F (37 C) (11/07 0000) Temp Source: Axillary (11/07 0000) BP: 111/55 mmHg (11/07 0300) Pulse Rate: 67 (11/07 0300)  Labs:  Recent Labs  03/04/15 1805  03/05/15 0255 03/05/15 1215 03/05/15 2202 03/06/15 0320 03/07/15 0330  HGB  --   --  10.8*  --   --  10.7* 9.9*  HCT  --   --  33.7*  --   --  33.0* 31.0*  PLT  --   --  160  --   --  162 166  APTT 36  --   --   --   --   --   --   LABPROT 16.5*  --   --   --   --   --   --   INR 1.32  --   --   --   --   --   --   HEPARINUNFRC  --   < > 0.28* 0.17* 0.35 0.30 0.25*  CREATININE  --   --  2.33*  --   --  2.09* 2.19*  TROPONINI  --   < > 8.43* 6.19* 4.75* 4.29*  --   < > = values in this interval not displayed.  Estimated Creatinine Clearance: 27.2 mL/min (by C-G formula based on Cr of 2.19).   Medical History: Past Medical History  Diagnosis Date  . CAD (coronary artery disease)     a. no known MImultiple PCIs;  b. S/P CABG;  c. all vein grafts occluded, LIMA patent by cath 08/2008.  Marland Kitchen Gout   . Macular degeneration     followed at [facility]  . PVD (peripheral vascular disease) ([place])   . OSA (obstructive sleep apnea)     using CPAP  . Diastolic heart failure     a. 08/2013 Echo: EF 60-65%, mild LVH, Gr 2 DD, mod MR.  Marland Kitchen Hypertension   . Hyperlipidemia   . Depression     symptoms  . CKD (chronic kidney disease), stage IV ([place])   . Renal adenoma     hx  . Nephrolithiasis     hx   . Hx of skin cancer, basal cell   . AAA (abdominal aortic aneurysm) ([place]) 2002    3.2 cm, with repair  . History of tobacco use     Remote (same for alcohol use)  . Claudication [facility])     a. 08/2013 ABI's R: 0.56, L 0.60 - f/u with Dr.  Gwenlyn Found scheduled.  . Bradycardia     a. 08/2013  . Hypotension     a. 08/2013  . CKD (chronic kidney disease) stage 4, GFR 15-29 ml/min (HCC) 10/04/2013  . OSA on CPAP 10/04/2013  . Lower extremity ulceration (HCC)     right medial malleolus, healed  . TESTOSTERONE DEFICIENCY 11/25/2006    Qualifier: Diagnosis of  By: Teryl Lucy RN, Darnell Level   . Solitary pulmonary nodule 04/26/2009    04/28/09 Ct- No worrisome pulmonary nodules.       Assessment: 36 yoM with CAD s/p bypass surgery in 1998 with PCI, normal EF, HTN, HLD, prior AAA repair, PVD, CKD-IV admitted with pneumonia.  Troponin drawn in ED mildly elevated.  Pt denies chest pain.  Repeat troponin this evening increased significantly.  Starting heparin infusion for ACS.  Pt has not received any anticoagulants this admission.  - Hgb 9.9, plts 166K - CrCl~27 ml/min - no complications of therapy noted -HL = 0.25 on heparin @ 1400 units/hr (continued to trend down despite rate increase yesterday)  Goal of Therapy:  Heparin level 0.3-0.7 units/ml Monitor platelets by anticoagulation protocol: Yes   Plan:  - increase heparin infusion to 1550 units/hr. - check heparin level 8 hr after rate increase - Daily heparin level and CBC while on heparin infusion.  Leone Haven, PharmD  03/07/2015, 5:17 AM

## 2015-03-07 NOTE — Progress Notes (Deleted)
70cc of Fentanyl wasted down the sink with Benjamin Stain, RN. Luther Parody, RN

## 2015-03-08 ENCOUNTER — Inpatient Hospital Stay (HOSPITAL_COMMUNITY): Payer: Medicare Other

## 2015-03-08 DIAGNOSIS — J9621 Acute and chronic respiratory failure with hypoxia: Secondary | ICD-10-CM

## 2015-03-08 DIAGNOSIS — N183 Chronic kidney disease, stage 3 (moderate): Secondary | ICD-10-CM

## 2015-03-08 DIAGNOSIS — R7989 Other specified abnormal findings of blood chemistry: Secondary | ICD-10-CM

## 2015-03-08 DIAGNOSIS — I5032 Chronic diastolic (congestive) heart failure: Secondary | ICD-10-CM

## 2015-03-08 LAB — CBC
HEMATOCRIT: 31.5 % — AB (ref 39.0–52.0)
Hemoglobin: 10.2 g/dL — ABNORMAL LOW (ref 13.0–17.0)
MCH: 27.4 pg (ref 26.0–34.0)
MCHC: 32.4 g/dL (ref 30.0–36.0)
MCV: 84.7 fL (ref 78.0–100.0)
Platelets: 166 10*3/uL (ref 150–400)
RBC: 3.72 MIL/uL — ABNORMAL LOW (ref 4.22–5.81)
RDW: 16.8 % — AB (ref 11.5–15.5)
WBC: 8.5 10*3/uL (ref 4.0–10.5)

## 2015-03-08 LAB — CULTURE, RESPIRATORY W GRAM STAIN

## 2015-03-08 LAB — BASIC METABOLIC PANEL
Anion gap: 10 (ref 5–15)
BUN: 44 mg/dL — AB (ref 6–20)
CHLORIDE: 107 mmol/L (ref 101–111)
CO2: 26 mmol/L (ref 22–32)
Calcium: 8.4 mg/dL — ABNORMAL LOW (ref 8.9–10.3)
Creatinine, Ser: 2.43 mg/dL — ABNORMAL HIGH (ref 0.61–1.24)
GFR calc Af Amer: 27 mL/min — ABNORMAL LOW (ref >60)
GFR calc non Af Amer: 23 mL/min — ABNORMAL LOW (ref >60)
GLUCOSE: 81 mg/dL (ref 65–99)
POTASSIUM: 3.4 mmol/L — AB (ref 3.5–5.1)
Sodium: 143 mmol/L (ref 135–145)

## 2015-03-08 LAB — CULTURE, RESPIRATORY

## 2015-03-08 LAB — PROCALCITONIN: Procalcitonin: 12.15 ng/mL

## 2015-03-08 LAB — BRAIN NATRIURETIC PEPTIDE: B Natriuretic Peptide: 1138.6 pg/mL — ABNORMAL HIGH (ref 0.0–100.0)

## 2015-03-08 MED ORDER — POTASSIUM CHLORIDE 10 MEQ/100ML IV SOLN
10.0000 meq | INTRAVENOUS | Status: AC
  Administered 2015-03-08 (×3): 10 meq via INTRAVENOUS
  Filled 2015-03-08 (×3): qty 100

## 2015-03-08 MED ORDER — HYDRALAZINE HCL 20 MG/ML IJ SOLN
10.0000 mg | INTRAMUSCULAR | Status: DC | PRN
  Administered 2015-03-08 – 2015-03-09 (×3): 20 mg via INTRAVENOUS
  Filled 2015-03-08 (×3): qty 1

## 2015-03-08 MED ORDER — ACETAMINOPHEN 325 MG PO TABS
650.0000 mg | ORAL_TABLET | Freq: Four times a day (QID) | ORAL | Status: DC | PRN
  Administered 2015-03-08: 650 mg via ORAL

## 2015-03-08 MED ORDER — CHLORHEXIDINE GLUCONATE 0.12 % MT SOLN
OROMUCOSAL | Status: AC
  Filled 2015-03-08: qty 15

## 2015-03-08 MED ORDER — TRAMADOL HCL 50 MG PO TABS
50.0000 mg | ORAL_TABLET | Freq: Once | ORAL | Status: AC
  Administered 2015-03-09: 50 mg via ORAL
  Filled 2015-03-08: qty 1

## 2015-03-08 NOTE — Progress Notes (Signed)
  Date: March 08, 2015 Chart reviewed for concurrent status and case management needs. Will continue to follow patient for changes and needs: Velva Harman, RN, BSN, [place]   [phone]

## 2015-03-08 NOTE — Progress Notes (Signed)
[place] Progress Note Patient Name: Harold [name] DOB: December 26, 1928 MRN: [id number]   Date of Service  03/08/2015  HPI/Events of Note  Hypertension HR 70s  eICU Interventions  Hydralazine prn     Intervention Category Intermediate Interventions: Hypertension - evaluation and management  Simonne Maffucci 03/08/2015, 5:13 PM

## 2015-03-08 NOTE — Progress Notes (Signed)
Patient Name: Harold [name] Date of Encounter: 03/08/2015  Principal Problem:   Acute on chronic respiratory failure ([place]) Active Problems:   Hyperlipidemia   Essential hypertension   DIASTOLIC HEART FAILURE, CHRONIC   Hx of CABG   CKD stage III. GFR 30-40   Parkinson's disease ([place])   Sepsis ([place])   CAP (community acquired pneumonia)   Elevated troponin    Primary Cardiologist: Dr. Aundra Dubin Patient Profile: 81 yo M w/ PMH of CAD (s/p CABG in 1998 with subsequent PCI), HTN, HLD, and Parkinson's disease,  and stage IV CKD admitted on 03/04/2015 for PNA. Cards consulted for elevated troponin.   SUBJECTIVE: Denies any chest pain or palpitations. Reports pain in his left arm at his IV site.   OBJECTIVE Filed Vitals:   03/08/15 0545 03/08/15 0600 03/08/15 0700 03/08/15 0800  BP:  164/60 163/62   Pulse:  75 79   Temp:    98.8 F (37.1 C)  TempSrc:    Axillary  Resp:  19 21   Height:      Weight: 168 lb 6.9 oz (76.4 kg)     SpO2:  95% 93%     Intake/Output Summary (Last 24 hours) at 03/08/15 1123 Last data filed at 03/08/15 0800  Gross per 24 hour  Intake    230 ml  Output   2700 ml  Net  -2470 ml   Filed Weights   03/06/15 1245 03/07/15 0445 03/08/15 0545  Weight: 170 lb 6.7 oz (77.3 kg) 174 lb 9.7 oz (79.2 kg) 168 lb 6.9 oz (76.4 kg)    PHYSICAL EXAM General: Well developed, well nourished, male in no acute distress. Head: Normocephalic, atraumatic.  Neck: Supple without bruits, JVD not elevated. Lungs:  Resp regular and unlabored, CTA without wheezing or rales. Heart: RRR, S1, S2, no S3, S4, or murmur; no rub. Abdomen: Soft, non-tender, non-distended with normoactive bowel sounds. No hepatomegaly. No rebound/guarding. No obvious abdominal masses. Extremities: No clubbing or cyanosis, trace edema. Distal pedal pulses are 2+ bilaterally. Neuro: Alert and oriented X 3. Moves all extremities spontaneously. Psych: Normal affect.  LABS: CBC: Recent Labs  03/07/15 0330 03/08/15 0345  WBC 11.9* 8.5  HGB 9.9* 10.2*  HCT 31.0* 31.5*  MCV 85.4 84.7  PLT 166 811   Basic Metabolic Panel: Recent Labs  03/07/15 0330 03/08/15 0345  NA 139 143  K 3.7 3.4*  CL 106 107  CO2 24 26  GLUCOSE 85 81  BUN 43* 44*  CREATININE 2.19* 2.43*  CALCIUM 8.3* 8.4*  MG 1.9  --   PHOS 3.2  --    Liver Function Tests: Recent Labs  03/07/15 0330  AST 18  ALT 9*  ALKPHOS 45  BILITOT 1.1  PROT 5.2*  ALBUMIN 2.3*   Cardiac Enzymes: Recent Labs  03/05/15 1215 03/05/15 2202 03/06/15 0320  TROPONINI 6.19* 4.75* 4.29*   BNP:  B NATRIURETIC PEPTIDE  Date/Time Value Ref Range Status  03/08/2015 03:45 AM 1138.6* 0.0 - 100.0 pg/mL Final    TELE:  NSR with rate in 50's - 70's.       ECHO: 03/05/2015 Study Conclusions - Left ventricle: The cavity size was normal. There was moderate concentric hypertrophy. Systolic function was normal. The estimated ejection fraction was in the range of 50% to 55%. Wall motion was normal; there were no regional wall motion abnormalities. Features are consistent with a pseudonormal left ventricular filling pattern, with concomitant abnormal relaxation and increased filling pressure (grade  2 diastolic dysfunction). - Mitral valve: There was mild to moderate regurgitation. - Left atrium: The atrium was mildly dilated. - Pulmonary arteries: Systolic pressure was mildly to moderately increased. PA peak pressure: 42 mm Hg (S).  Radiology/Studies:   Dg Chest Port 1 View: 04/05/2015  CLINICAL DATA:  Acute respiratory failure, hypoxia, sepsis, history of CABG and coronary stent placement, CHF, chronic renal insufficiency, Parkinson's disease EXAM: PORTABLE CHEST 1 VIEW COMPARISON:  Portable chest x-ray of March 07, 2015 FINDINGS: There has been interval extubation of the trachea and of the esophagus. The lungs are adequately inflated. Confluent and interstitial and alveolar opacities have become more  conspicuous bilaterally greatest on the right. There is no large pleural effusion. There is no pneumothorax. The cardiac silhouette is mildly enlarged but stable. There are 7 intact sternal wires from previous CABG. The bony thorax exhibits no acute abnormality. IMPRESSION: Interval increase in conspicuity of bilateral interstitial and alveolar opacities consistent with pneumonia and/or pulmonary edema. Interval extubation of the trachea. Electronically Signed   By: David  Martinique M.D.   On: 05-Apr-2015 07:03   Current Medications:  . antiseptic oral rinse  7 mL Mouth Rinse BID  . aspirin EC  81 mg Oral Daily  . chlorhexidine gluconate  15 mL Mouth Rinse BID  . clopidogrel  75 mg Per Tube Daily  . enoxaparin (LOVENOX) injection  30 mg Subcutaneous Q24H  . furosemide  40 mg Intravenous Daily  . pantoprazole sodium  40 mg Per Tube Daily  . piperacillin-tazobactam (ZOSYN)  IV  3.375 g Intravenous 3 times per day  . potassium chloride  10 mEq Intravenous Q1 Hr x 3  . simvastatin  20 mg Per Tube Daily  . sodium chloride  1,000 mL Intravenous Once      ASSESSMENT AND PLAN:  1. NSTEMI - troponin peaked at 8.43 on 03/05/2015. - cath in 2010 showed SVG's occluded with patent LIMA.  - continue ASA, statin and Plavix. No BB due to bradycardia. No ACE-I/ARB due to CKD.   2. Hyperlipidemia -continue statin  3. CAP (community acquired pneumonia) - per admitting team  4. Chronic Diastolic CHF - Echo on 70/05/7791 showing EF of 50% to 90%, grade 2 diastolic dysfunction, and increased PA peak pressure of 42 mm Hg (S). - BNP 1138 on Apr 05, 2015 - continue Lasix 40mg  IV daily. Switch to PO once tolerating PO medications.     Arna Medici , PA-C 11:23 AM 2015/04/05 Pager: [phone]  Agree with note by Guinevere Scarlet  Clinically improving. On ATBX for CAP. Doubt ACS. Nl LV fxn by 2D. Cont diuretics. No plan for functional study. Will S/O. Call if we can be of further  assistance.    Lorretta Harp, M.D., [place], [facility], [place] [address] [place], [place]  [address]  [phone] 05-Apr-2015 3:01 PM

## 2015-03-08 NOTE — Progress Notes (Signed)
  Assessment / Plan / Recommendation CHL IP CLINICAL IMPRESSIONS 03/08/2015  Therapy Diagnosis Moderate oral phase dysphagia;Moderate pharyngeal phase dysphagia  Clinical Impression Moderate oropharyngeal dysphagia characterized by sensorimotor deficits consistent with Parkinsons disease - and likely exacerbated due to recent med event.  Swallow ability is much more impaired than during evaluation as OP in April 2016.    Delayed oral transiting with lingual pumping/rocking noted due to weakness.   Pharyngeal swallow was intermittently delayed with poor laryngeal closure allowing mild SILENT aspiration of thin via straw.  Cough did not clear aspirates fully.  Pt did not any other consistencies nor thin via cup/tsp.    Residuals were much worse with increased viscosity and partially masticated cracker precariously lodged in pyriform sinus close to open airway.   However due to his weakness and poor sensate, he will be an aspiration risk regardless.  Per review of prior MBS, SLP had suspected chronic aspiration at that time and family confirmed pt with episodic aspiration during 03/04/15 SLP visit.    Options include to start conservative diet with strict precautions to migitate risk or keep npo pending improved pt strength.  Pt expressed that he desires to consume intake regardless.    If plan diet with mitigation strategies, recommend to start with liquids only due to decreased amount of residuals.  Using teach back and live monitor, education completed.      CHL IP TREATMENT RECOMMENDATION 03/08/2015  Treatment Recommendations Therapy as outlined in treatment plan below;F/u HH SLP     CHL IP DIET RECOMMENDATION 03/08/2015  SLP Diet Recommendations Thin;Nectar - with risks   Liquid Administration via No straw, cup only  Medication Administration Crushed with puree  Compensations Slow rate;Small sips/bites, intermittent dry swallow/cough and expectoration prn  Postural Changes and/or Swallow  Maneuvers Upright 90*, stay up after meals     CHL IP OTHER RECOMMENDATIONS 03/08/2015  Recommended Consults (None)  Oral Care Recommendations Oral care BID  Other Recommendations (None)     CHL IP FOLLOW UP RECOMMENDATIONS 08/23/2014  Follow up Recommendations Outpatient SLP     CHL IP FREQUENCY AND DURATION 03/08/2015  Speech Therapy Frequency (ACUTE ONLY) min 2x/week  Treatment Duration 2 weeks      Luanna Salk, [facility] SLP [phone]

## 2015-03-08 NOTE — Progress Notes (Signed)
[place] Progress Note Patient Name: Harold [name] DOB: 1928-07-15 MRN: [id number]   Date of Service  03/08/2015  HPI/Events of Note  Patient c/o feet ache, h eel pain and knee pain - tylenol did not help  pn camera - awake and goriented x 3. Giving hx. Deconditioned   Recent Labs Lab 03/04/15 1359 03/05/15 0255 03/06/15 0320 03/07/15 0330 03/08/15 0345  CREATININE 2.00* 2.33* 2.09* 2.19* 2.43*      eICU Interventions  Ultram 50mg  x 1      Intervention Category Intermediate Interventions: Pain - evaluation and management  Kosta Schnitzler 03/08/2015, 11:51 PM

## 2015-03-08 NOTE — Evaluation (Signed)
Physical Therapy Evaluation Patient Details Name: Harold [name] MRN: [id number] DOB: 1928/05/29 Today's Date: 03/08/2015   History of Present Illness  Pt admitted with dx of respiratory failure and hx of CAD, PVD, macular degenerations, CKD, CABG and Parkinsons  Clinical Impression  Pt admitted as above and presenting with functional mobility limitations 2* generalized weakness, ltd endurance and ambulatory instability.  Pt would benefit from follow up rehab at SNF level to maximize IND and safety.    Follow Up Recommendations SNF    Equipment Recommendations  None recommended by PT    Recommendations for Other Services OT consult     Precautions / Restrictions Precautions Precautions: Fall Precaution Comments: Visually impaired Restrictions Weight Bearing Restrictions: No      Mobility  Bed Mobility               General bed mobility comments: OOB with nursing  Transfers Overall transfer level: Needs assistance Equipment used: Rolling walker (2 wheeled) Transfers: Sit to/from Stand Sit to Stand: Mod assist;+2 physical assistance         General transfer comment: cues for LE management and use of UEs.  Physical assist to bring wt up and fwd and to initially balance in standing.,  Pt with increased difficulty from low recliner 2* ltd flex bil knees  Ambulation/Gait Ambulation/Gait assistance: Min assist;Mod assist;+2 safety/equipment Ambulation Distance (Feet): 48 Feet Assistive device: Rolling walker (2 wheeled) Gait Pattern/deviations: Step-through pattern;Decreased step length - right;Decreased step length - left;Shuffle;Trunk flexed Gait velocity: decr   General Gait Details: cues for posture, pacing and position from [employer]            Wheelchair Mobility    Modified Rankin (Stroke Patients Only)       Balance Overall balance assessment: Needs assistance Sitting-balance support: Feet supported Sitting balance-Leahy Scale: Fair      Standing balance support: Bilateral upper extremity supported Standing balance-Leahy Scale: Poor                               Pertinent Vitals/Pain Pain Assessment: No/denies pain    Home Living Family/patient expects to be discharged to:: Private residence Living Arrangements: Alone Available Help at Discharge: Available PRN/intermittently Type of Home: House       Home Layout: One level Home Equipment: Environmental consultant - 2 wheels Additional Comments: Pt had assist during the day    Prior Function Level of Independence: Needs assistance   Gait / Transfers Assistance Needed: RW  ADL's / Homemaking Assistance Needed: Assist with meals, laundry and cleaning        Hand Dominance        Extremity/Trunk Assessment   Upper Extremity Assessment: Generalized weakness           Lower Extremity Assessment: Generalized weakness;RLE deficits/detail;LLE deficits/detail RLE Deficits / Details: ROM at knees ltd to ~90 degrees and limiting pt ability to stand from lower recliner LLE Deficits / Details: as R LE  Cervical / Trunk Assessment: Kyphotic  Communication   Communication: No difficulties  Cognition Arousal/Alertness: Awake/alert Behavior During Therapy: WFL for tasks assessed/performed Overall Cognitive Status: Within Functional Limits for tasks assessed                      General Comments      Exercises        Assessment/Plan    PT Assessment Patient needs continued PT services  PT Diagnosis  Difficulty walking   PT Problem List Decreased strength;Decreased range of motion;Decreased activity tolerance;Decreased mobility;Decreased balance;Decreased knowledge of use of DME  PT Treatment Interventions DME instruction;Gait training;Functional mobility training;Therapeutic activities;Therapeutic exercise;Patient/family education   PT Goals (Current goals can be found in the Care Plan section) Acute Rehab PT Goals Patient Stated Goal: Regain  independence PT Goal Formulation: With patient Time For Goal Achievement: 03/22/15 Potential to Achieve Goals: Fair    Frequency Min 3X/week   Barriers to discharge        Co-evaluation               End of Session Equipment Utilized During Treatment: Gait belt;Oxygen Activity Tolerance: Patient tolerated treatment well;Patient limited by fatigue Patient left: in chair;with call bell/phone within reach Nurse Communication: Mobility status         Time: 9450-3888 PT Time Calculation (min) (ACUTE ONLY): 23 min   Charges:   PT Evaluation $Initial PT Evaluation Tier I: 1 Procedure PT Treatments $Gait Training: 8-22 mins   PT G Codes:        Aleia Larocca 2015/03/25, 12:50 PM

## 2015-03-08 NOTE — Progress Notes (Signed)
PULMONARY / CRITICAL CARE MEDICINE   Name: Harold [name] MRN: [id number] DOB: 08-31-1928    ADMISSION DATE:  03/04/2015 CONSULTATION DATE:  03/06/2015  REFERRING MD :  Dr. Broadus John  CHIEF COMPLAINT:  Short of breath  INITIAL PRESENTATION:  81 yo male presented with progressive dyspnea and cough with yellow sputum 2nd to PNA.  Developed respiratory failure and intubated 11/5 for hypercarbic resp failure  STUDIES:  11/05 Echo >> EF 50 to 55%, mod LVH, grade 2 diastolic dysfx, mod MR, PAS 42 mmHg  SIGNIFICANT EVENTS: 11/04 Admit, cardiology consult, heparin gtt 11/06 transfer to ICU  SUBJECTIVE:  Afebrile Denies CP, improved dysnea Breathing ok Dry mouth  VITAL SIGNS: Temp:  [97.6 F (36.4 C)-98.8 F (37.1 C)] 98.8 F (37.1 C) (11/08 0800) Pulse Rate:  [65-80] 79 (11/08 0700) Resp:  [15-26] 21 (11/08 0700) BP: (125-169)/(49-67) 163/62 mmHg (11/08 0700) SpO2:  [91 %-97 %] 93 % (11/08 0700) Weight:  [76.4 kg (168 lb 6.9 oz)] 76.4 kg (168 lb 6.9 oz) (11/08 0545) HEMODYNAMICS:   VENTILATOR SETTINGS:   INTAKE / OUTPUT:  Intake/Output Summary (Last 24 hours) at 03/08/15 0941 Last data filed at 03/08/15 0800  Gross per 24 hour  Intake 273.92 ml  Output   2700 ml  Net -2426.08 ml    PHYSICAL EXAMINATION: General: acutely ill, interactive Neuro:  Alert, follows commands, non focal HEENT:  No jvd Cardiovascular:  Regular, tachycardic, 2/6 SM Lungs:  B/l clear Abdomen:  Soft, non tender Musculoskeletal:  1+ edema Skin:  No rashes  LABS:  CBC  Recent Labs Lab 03/06/15 0320 03/07/15 0330 03/08/15 0345  WBC 13.8* 11.9* 8.5  HGB 10.7* 9.9* 10.2*  HCT 33.0* 31.0* 31.5*  PLT 162 166 166   Coag's  Recent Labs Lab 03/04/15 1805  APTT 36  INR 1.32   BMET  Recent Labs Lab 03/06/15 0320 03/07/15 0330 03/08/15 0345  NA 135 139 143  K 3.8 3.7 3.4*  CL 104 106 107  CO2 22 24 26   BUN 35* 43* 44*  CREATININE 2.09* 2.19* 2.43*  GLUCOSE 87 85 81    Electrolytes  Recent Labs Lab 03/06/15 0320 03/07/15 0330 03/08/15 0345  CALCIUM 8.4* 8.3* 8.4*  MG  --  1.9  --   PHOS  --  3.2  --    Sepsis Markers  Recent Labs Lab 03/04/15 1403 03/04/15 1658  LATICACIDVEN 1.83 2.06*   ABG  Recent Labs Lab 03/06/15 0955 03/06/15 1420  PHART 7.244* 7.372  PCO2ART 53.1* 37.8  PO2ART 75.5* 247*   Liver Enzymes  Recent Labs Lab 03/04/15 1359 03/07/15 0330  AST 21 18  ALT 8* 9*  ALKPHOS 64 45  BILITOT 1.1 1.1  ALBUMIN 3.2* 2.3*   Cardiac Enzymes  Recent Labs Lab 03/05/15 1215 03/05/15 2202 03/06/15 0320  TROPONINI 6.19* 4.75* 4.29*   Glucose No results for input(s): GLUCAP in the last 168 hours.  Imaging Dg Chest Port 1 View  03/08/2015  CLINICAL DATA:  Acute respiratory failure, hypoxia, sepsis, history of CABG and coronary stent placement, CHF, chronic renal insufficiency, Parkinson's disease EXAM: PORTABLE CHEST 1 VIEW COMPARISON:  Portable chest x-ray of March 07, 2015 FINDINGS: There has been interval extubation of the trachea and of the esophagus. The lungs are adequately inflated. Confluent and interstitial and alveolar opacities have become more conspicuous bilaterally greatest on the right. There is no large pleural effusion. There is no pneumothorax. The cardiac silhouette is mildly enlarged but stable. There are  7 intact sternal wires from previous CABG. The bony thorax exhibits no acute abnormality. IMPRESSION: Interval increase in conspicuity of bilateral interstitial and alveolar opacities consistent with pneumonia and/or pulmonary edema. Interval extubation of the trachea. Electronically Signed   By: David  Martinique M.D.   On: 03/08/2015 07:03     ASSESSMENT / PLAN:  PULMONARY ETT 11/06 >>11/7 A: Acute hypoxic/hypercapnic respiratory failure 2nd to pneumonia. Hx of OSA. P:   CXR again looks worse today ? pna vs fluid  CARDIOVASCULAR A:  Sepsis 2nd to PNA. NSTEMI. Hx of CAD s/p CABG-all SVGs  occluded at cath 2010 (Patent LIMA) Acute on chronic diastolic CHF. Hx of HTN, HLD. P:  Hold lasix, imdur, coreg Continue ASA, zocor, plavix Not a geat candidate for cath given age, active multi lobar penumonia and elevated Cr -med therapy per cards Heparin gtt stopped  RENAL A:   CKD stage IV. Lactic acidosis -resolved. P:    repelte lytes as needed  GASTROINTESTINAL A:   Dysphagia. Nutrition. P:   Npo until swallow eval/ MBS today Protonix for SUP  HEMATOLOGIC A:   Anemia of critical illness. P:  F/u CBC  INFECTIOUS A:   Sepsis from pneumonia. P:   Day 3 of Abx, change to vancomycin, zosyn 11/06 F/u procalcitonin & simplify once cx back  Urine 11/04 >> 80 K enterococcus Blood 11/04 >> ng Sputum 11/06 >> oral flora   NEUROLOGIC A:   Hx of Depression. P:   Hold bupropion   Summary - Intubated in setting of HCAP & NSTEMI & ? Edema on CXR looks worse, ct diuresis  The patient is critically ill with multiple organ systems failure and requires high complexity decision making for assessment and support, frequent evaluation and titration of therapies, application of advanced monitoring technologies and extensive interpretation of multiple databases. Critical Care Time devoted to patient care services described in this note independent of APP time is 31 minutes.    Kara Mead MD. [place] Flood. [place] [facility] Pager [phone] If no response call [phone]    03/08/2015, 9:41 AM

## 2015-03-09 DIAGNOSIS — I5033 Acute on chronic diastolic (congestive) heart failure: Secondary | ICD-10-CM

## 2015-03-09 LAB — BASIC METABOLIC PANEL
ANION GAP: 9 (ref 5–15)
BUN: 39 mg/dL — ABNORMAL HIGH (ref 6–20)
CALCIUM: 8.8 mg/dL — AB (ref 8.9–10.3)
CO2: 27 mmol/L (ref 22–32)
Chloride: 105 mmol/L (ref 101–111)
Creatinine, Ser: 2.19 mg/dL — ABNORMAL HIGH (ref 0.61–1.24)
GFR, EST AFRICAN AMERICAN: 30 mL/min — AB (ref >60)
GFR, EST NON AFRICAN AMERICAN: 26 mL/min — AB (ref >60)
Glucose, Bld: 103 mg/dL — ABNORMAL HIGH (ref 65–99)
Potassium: 3 mmol/L — ABNORMAL LOW (ref 3.5–5.1)
SODIUM: 141 mmol/L (ref 135–145)

## 2015-03-09 LAB — CBC
HCT: 35.9 % — ABNORMAL LOW (ref 39.0–52.0)
HEMOGLOBIN: 11.3 g/dL — AB (ref 13.0–17.0)
MCH: 26.7 pg (ref 26.0–34.0)
MCHC: 31.5 g/dL (ref 30.0–36.0)
MCV: 84.9 fL (ref 78.0–100.0)
Platelets: 179 10*3/uL (ref 150–400)
RBC: 4.23 MIL/uL (ref 4.22–5.81)
RDW: 16.8 % — ABNORMAL HIGH (ref 11.5–15.5)
WBC: 8.1 10*3/uL (ref 4.0–10.5)

## 2015-03-09 LAB — CULTURE, BLOOD (ROUTINE X 2)
Culture: NO GROWTH
Culture: NO GROWTH

## 2015-03-09 LAB — PHOSPHORUS: PHOSPHORUS: 2.9 mg/dL (ref 2.5–4.6)

## 2015-03-09 LAB — PROCALCITONIN: PROCALCITONIN: 5.76 ng/mL

## 2015-03-09 LAB — MAGNESIUM: MAGNESIUM: 2.5 mg/dL — AB (ref 1.7–2.4)

## 2015-03-09 MED ORDER — LEVOFLOXACIN IN D5W 500 MG/100ML IV SOLN: 500.0000 mg | INTRAVENOUS | Status: DC

## 2015-03-09 MED ORDER — LEVOFLOXACIN IN D5W 750 MG/150ML IV SOLN
750.0000 mg | Freq: Once | INTRAVENOUS | Status: AC
  Administered 2015-03-09: 750 mg via INTRAVENOUS
  Filled 2015-03-09: qty 150

## 2015-03-09 MED ORDER — MAGNESIUM SULFATE IN D5W 10-5 MG/ML-% IV SOLN
1.0000 g | Freq: Once | INTRAVENOUS | Status: AC
  Administered 2015-03-09: 1 g via INTRAVENOUS
  Filled 2015-03-09: qty 100

## 2015-03-09 MED ORDER — POTASSIUM CHLORIDE 20 MEQ/15ML (10%) PO SOLN
40.0000 meq | Freq: Two times a day (BID) | ORAL | Status: AC
  Administered 2015-03-09 (×2): 40 meq
  Filled 2015-03-09 (×2): qty 30

## 2015-03-09 NOTE — Progress Notes (Signed)
PULMONARY / CRITICAL CARE MEDICINE   Name: Harold [name] MRN: [id number] DOB: [birth year]    ADMISSION DATE:  03/04/2015 CONSULTATION DATE:  03/06/2015  REFERRING MD :  Dr. Broadus [name]  CHIEF COMPLAINT:  Short of breath  INITIAL PRESENTATION:  81 yo male presented with progressive dyspnea and cough with yellow sputum 2nd to PNA.  Developed respiratory failure and intubated 11/5 for hypercarbic resp failure  STUDIES:  11/05 Echo >> EF 50 to 55%, mod LVH, grade 2 diastolic dysfx, mod MR, PAS 42 mmHg  SIGNIFICANT EVENTS: 11/04 Admit, cardiology consult, heparin gtt 11/06 transfer to ICU  SUBJECTIVE:  C/o "worn out".  Walked with PT yesterday and OOB for much of day.  Denies SOB, chest pain.  Had pause on tele this am, asymptomatic.   VITAL SIGNS: Temp:  [98.2 F (36.8 C)-100.6 F (38.1 C)] 98.5 F (36.9 C) (11/09 0800) Pulse Rate:  [71-92] 81 (11/09 0800) Resp:  [16-26] 16 (11/09 0800) BP: (121-188)/(44-77) 147/59 mmHg (11/09 0800) SpO2:  [91 %-100 %] 91 % (11/09 0800) Weight:  [167 lb 5.3 oz (75.9 kg)] 167 lb 5.3 oz (75.9 kg) (11/09 0450) HEMODYNAMICS:   VENTILATOR SETTINGS:   INTAKE / OUTPUT:  Intake/Output Summary (Last 24 hours) at 03/09/15 0927 Last data filed at 03/09/15 0800  Gross per 24 hour  Intake    540 ml  Output    675 ml  Net   -135 ml    PHYSICAL EXAMINATION: General: chronically ill appearing, NAD in bed  Neuro:  Awake, alert, follows commands, non focal HEENT:  No jvd Cardiovascular:  Regular, tachycardic, 2/6 SM Lungs:  resps even non labored on [place], B/l clear Abdomen:  Soft, non tender Musculoskeletal:  Scant BLE edema Skin:  No rashes  LABS:  CBC  Recent Labs Lab 03/07/15 0330 03/08/15 0345 03/09/15 0350  WBC 11.9* 8.5 8.1  HGB 9.9* 10.2* 11.3*  HCT 31.0* 31.5* 35.9*  PLT 166 166 179   Coag's  Recent Labs Lab 03/04/15 1805  APTT 36  INR 1.32   BMET  Recent Labs Lab 03/07/15 0330 03/08/15 0345 03/09/15 0350  NA 139  143 141  K 3.7 3.4* 3.0*  CL 106 107 105  CO2 24 26 27   BUN 43* 44* 39*  CREATININE 2.19* 2.43* 2.19*  GLUCOSE 85 81 103*   Electrolytes  Recent Labs Lab 03/07/15 0330 03/08/15 0345 03/09/15 0350  CALCIUM 8.3* 8.4* 8.8*  MG 1.9  --  2.5*  PHOS 3.2  --  2.9   Sepsis Markers  Recent Labs Lab 03/04/15 1403 03/04/15 1658 03/08/15 0345 03/09/15 0350  LATICACIDVEN 1.83 2.06*  --   --   PROCALCITON  --   --  12.15 5.76   ABG  Recent Labs Lab 03/06/15 0955 03/06/15 1420  PHART 7.244* 7.372  PCO2ART 53.1* 37.8  PO2ART 75.5* 247*   Liver Enzymes  Recent Labs Lab 03/04/15 1359 03/07/15 0330  AST 21 18  ALT 8* 9*  ALKPHOS 64 45  BILITOT 1.1 1.1  ALBUMIN 3.2* 2.3*   Cardiac Enzymes  Recent Labs Lab 03/05/15 1215 03/05/15 2202 03/06/15 0320  TROPONINI 6.19* 4.75* 4.29*   Glucose No results for input(s): GLUCAP in the last 168 hours.  Imaging Dg Swallowing Func-speech Pathology  03/08/2015  Objective Swallowing Evaluation:   Patient Details Name: Harold [name] MRN: [id number] Date of Birth: 12-28-1928 Today's Date: 03/08/2015 Time: SLP Start Time (ACUTE ONLY): 1341-SLP Stop Time (ACUTE ONLY): 1419 SLP Time Calculation (min) (  ACUTE ONLY): 38 min Past Medical History: Past Medical History Diagnosis Date . CAD (coronary artery disease)    a. no known MImultiple PCIs;  b. S/P CABG;  c. all vein grafts occluded, LIMA patent by cath 08/2008. Marland Kitchen Gout  . Macular degeneration    followed at [name] . PVD (peripheral vascular disease) (Harold [name])  . OSA (obstructive sleep apnea)    using CPAP . Diastolic heart failure    a. 08/2013 Echo: EF 60-65%, mild LVH, Gr 2 DD, mod MR. Marland Kitchen Hypertension  . Hyperlipidemia  . Depression    symptoms . CKD (chronic kidney disease), stage IV (Harold [name])  . Renal adenoma    hx . Nephrolithiasis    hx  . Hx of skin cancer, basal cell  . AAA (abdominal aortic aneurysm) ([place]) [name]   3.2 cm, with repair . History of tobacco use    Remote (same for alcohol use) .  Claudication Harold [name])    a. 08/2013 ABI's R: 0.56, L 0.60 - f/u with Dr. Gwenlyn [name] scheduled. . Bradycardia    a. 08/2013 . Hypotension    a. 08/2013 . CKD (chronic kidney disease) stage 4, GFR 15-29 ml/min (Harold [name]) 10/04/2013 . OSA on CPAP 10/04/2013 . Lower extremity ulceration (Harold [name])    right medial malleolus, healed . TESTOSTERONE DEFICIENCY 11/25/2006   Qualifier: Diagnosis of  By: Harold Lucy RN, Harold [name]  . Solitary pulmonary nodule 04/26/2009   04/28/09 Ct- No worrisome pulmonary nodules.   Past Surgical History: Past Surgical History Procedure Laterality Date . Abdominal aortic aneurysm repair   . Transurethral resection of prostate   . Coronary artery bypass graft  1990   s/p multiple PCIs with occlusion of al SVGs and only patnet LIMA at cath 5/10  . Cholecystectomy   . Lumbar disc surgery     x5 . Coronary stent placement     multiple; 12/02 . Back surgery   . Prostate surgery   HPI: Other Pertinent Information: Harold [name] is a 81 y.o. male the past medical history of CAD, CABG, chronic diastolic CHF, heavy former smoker, CKD 3, Parkinson's disease, memory loss, macular degeneration presents with shortness of breath. Patient reports productive cough with thick yellow phlegm and progressive shortness of breath for the last 2 weeks. He is a poor historian, lives in alone and has a caregiver during the daytime. In the emergency room he was [name] to be hypoxic, tachycardic with a temperature of 102.4, chest x-ray with extensive right middle and lower lobe pneumonia concerning of aspiration etiology. ST to evaluate current swallow function - pt underwent BSE 11/4 and was started on regular/thin diet.  On 11/6 pt with respiratory failure - requiring ventilator support.  Pt was extubated yesterday 11/7.  Repeat swallow eval ordered.  Given h/o dysphagia and complex medical course, rec MBS.   Pt underwent MBS in 07/2014 as an OP.   No Data Recorded Assessment / Plan / Recommendation CHL IP CLINICAL IMPRESSIONS 03/08/2015  Therapy Diagnosis Moderate oral phase dysphagia;Moderate pharyngeal phase dysphagia Clinical Impression Moderate oropharyngeal dysphagia characterized by sensorimotor deficits consistent with Parkinsons disease - and likely exacerbated due to recent med event.  Swallow ability is much more impaired than during evaluation as OP in April 2016.  Delayed oral transiting with lingual pumping/rocking noted due to weakness.   Pharyngeal swallow was intermittently delayed with poor laryngeal closure allowing mild SILENT aspiration of thin via straw.  Cough did not clear aspirates fully.  Pt did not any other consistencies nor thin  via cup/tsp.    Residuals were much worse with increased viscosity and partially masticated cracker precariously lodged in pyriform sinus close to open airway. However due to his weakness and poor sensate, he will be an aspiration risk regardless.  Per review of prior MBS, SLP had suspected chronic aspiration at that time and family confirmed pt with episodic aspiration during 03/04/15 SLP visit.  Options include to start conservative diet with strict precautions to migitate risk or keep npo pending improved pt strength.  Pt expressed that he desires to consume intake regardless.  If plan diet with mitigation strategies, recommend to start with liquids only due to decreased amount of residuals.  Using teach back and live monitor, education completed.   CHL IP TREATMENT RECOMMENDATION 03/08/2015 Treatment Recommendations Therapy as outlined in treatment plan below;F/u HH SLP   CHL IP DIET RECOMMENDATION 03/08/2015 SLP Diet Recommendations Thin;Nectar - with risks  Liquid Administration via No straw, cup only Medication Administration Crushed with puree Compensations Slow rate;Small sips/bites, intermittent dry swallow/cough and expectoration prn Postural Changes and/or Swallow Maneuvers Upright 90*, stay up after meals   CHL IP OTHER RECOMMENDATIONS 03/08/2015 Recommended Consults (None) Oral Care  Recommendations Oral care BID Other Recommendations (None)   CHL IP FOLLOW UP RECOMMENDATIONS 08/23/2014 Follow up Recommendations Outpatient SLP   CHL IP FREQUENCY AND DURATION 03/08/2015 Speech Therapy Frequency (ACUTE ONLY) min 2x/week Treatment Duration 2 weeks     CHL IP REASON FOR REFERRAL 03/08/2015 Reason for Referral Objectively evaluate swallowing function   CHL IP ORAL PHASE 03/08/2015 Lips (None) Tongue (None) Mucous membranes (None) Nutritional status (None) Other (None) Oxygen therapy (None) Oral Phase Impaired Oral - Pudding Teaspoon (None) Oral - Pudding Cup (None) Oral - Honey Teaspoon (None) Oral - Honey Cup (None) Oral - Honey Syringe (None) Oral - Nectar Teaspoon (None) Oral - Nectar Cup (None) Oral - Nectar Straw (None) Oral - Nectar Syringe (None) Oral - Ice Chips (None) Oral - Thin Teaspoon (None) Oral - Thin Cup (None) Oral - Thin Straw (None) Oral - Thin Syringe (None) Oral - Puree (None) Oral - Mechanical Soft (None) Oral - Regular (None) Oral - Multi-consistency (None) Oral - Pill (None) Oral Phase - Comment (None)   CHL IP PHARYNGEAL PHASE 03/08/2015 Pharyngeal Phase Impaired Pharyngeal - Pudding Teaspoon (None) Penetration/Aspiration details (pudding teaspoon) (None) Pharyngeal - Pudding Cup (None) Penetration/Aspiration details (pudding cup) (None) Pharyngeal - Honey Teaspoon (None) Penetration/Aspiration details (honey teaspoon) (None) Pharyngeal - Honey Cup (None) Penetration/Aspiration details (honey cup) (None) Pharyngeal - Honey Syringe (None) Penetration/Aspiration details (honey syringe) (None) Pharyngeal - Nectar Teaspoon (None) Penetration/Aspiration details (nectar teaspoon) (None) Pharyngeal - Nectar Cup (None) Penetration/Aspiration details (nectar cup) (None) Pharyngeal - Nectar Straw (None) Penetration/Aspiration details (nectar straw) (None) Pharyngeal - Nectar Syringe (None) Penetration/Aspiration details (nectar syringe) (None) Pharyngeal - Ice Chips (None)  Penetration/Aspiration details (ice chips) (None) Pharyngeal - Thin Teaspoon (None) Penetration/Aspiration details (thin teaspoon) (None) Pharyngeal - Thin Cup (None) Penetration/Aspiration details (thin cup) (None) Pharyngeal - Thin Straw (None) Penetration/Aspiration details (thin straw) (None) Pharyngeal - Thin Syringe (None) Penetration/Aspiration details (thin syringe') (None) Pharyngeal - Puree (None) Penetration/Aspiration details (puree) (None) Pharyngeal - Mechanical Soft (None) Penetration/Aspiration details (mechanical soft) (None) Pharyngeal - Regular (None) Penetration/Aspiration details (regular) (None) Pharyngeal - Multi-consistency (None) Penetration/Aspiration details (multi-consistency) (None) Pharyngeal - Pill (None) Penetration/Aspiration details (pill) (None) Pharyngeal Comment pt has NO sensation to pharyngeal residuals - liquids noted across consistencies, cued dry swallows marginally helpful -  cracker residuals precariously noted at pyriform sinus -  close to airway - pudding helped to clear cracker residuals   CHL IP CERVICAL ESOPHAGEAL PHASE 03/08/2015 Cervical Esophageal Phase Impaired Pudding Teaspoon (None) Pudding Cup (None) Honey Teaspoon (None) Honey Cup (None) Honey Straw (None) Nectar Teaspoon Prominent cricopharyngeal segment;Reduced cricopharyngeal relaxation Nectar Cup Prominent cricopharyngeal segment;Reduced cricopharyngeal relaxation Nectar Straw (None) Nectar Sippy Cup (None) Thin Teaspoon Reduced cricopharyngeal relaxation;Prominent cricopharyngeal segment Thin Cup Reduced cricopharyngeal relaxation;Prominent cricopharyngeal segment Thin Straw Reduced cricopharyngeal relaxation;Prominent cricopharyngeal segment Thin Sippy Cup (None) Cervical Esophageal Comment esophageal sweep at end of study revealed adequate clearance - no radiologist present to confirm   Luanna Salk, [facility] SLP [phone]     ASSESSMENT / PLAN:  PULMONARY ETT 11/06 >>11/7 A: Acute  hypoxic/hypercapnic respiratory failure 2nd to pneumonia. Hx of OSA. P:   pulm hygiene  F/u CXR in am 11/10 PRN BD  Mobilize  abx as below   CARDIOVASCULAR A:  Sepsis 2nd to PNA. NSTEMI. Hx of CAD s/p CABG-all SVGs occluded at cath 2010 (Patent LIMA) Acute on chronic diastolic CHF. Hx of HTN, HLD. P:  Continue to hold imdur, coreg for now  Continue ASA, zocor, plavix PO daily lasix  PRN hydralazine for HTN Not a geat candidate for cath given age, active multi lobar penumonia and elevated Cr -med therapy per cards   RENAL A:   CKD stage IV. Lactic acidosis -resolved. Hypokalemia  P:    repelte lytes as needed  GASTROINTESTINAL A:   Dysphagia - confirmed on MBS 11/9 Nutrition. P:   Liquids only per ST  Ongoing speech therapy  ?long term plan for nutrition  Protonix for SUP  HEMATOLOGIC A:   Anemia of critical illness. P:  F/u CBC  INFECTIOUS A:   Sepsis from pneumonia. P:   Zosyn 11/6>>>11/10 Levaquin 11/10>>>  Urine 11/04 >> 80 K enterococcus Blood 11/04 >> ng Sputum 11/06 >> oral flora  Narrow to levaquin 11/10 for enterococcus UTI (sens) and PNA  Cont trend pct   NEUROLOGIC A:   Hx of Depression. P:   Hold bupropion   Summary - Intubated in setting of HCAP & NSTEMI c/b probable aspiration.  Improving slowly with diuresis.  Mobilize, continue abx as above.   Tx tele, to [facility]     Nickolas Madrid, NP 03/09/2015  9:28 AM Pager: [phone] or [phone]

## 2015-03-09 NOTE — Progress Notes (Signed)
Patient had a 0.59 second pause on telemetry. Pt.was asymptomatic. EKG was done. Called and notified E-link MD. New orders written.

## 2015-03-09 NOTE — Clinical Social Work Note (Signed)
Clinical Social Work Assessment  Patient Details  Name: Harold [name] MRN: [id number] Date of Birth: 09/09/1928  Date of referral:  03/09/15               Reason for consult:  Discharge Planning                Permission sought to share information with:    Permission granted to share information::     Name::        Agency::     Relationship::     Contact Information:     Housing/Transportation Living arrangements for the past 2 months:  Single Family Home Source of Information:  Patient Patient Interpreter Needed:  None Criminal Activity/Legal Involvement Pertinent to Current Situation/Hospitalization:  No - Comment as needed Significant Relationships:  Adult Children, Friend Lives with:  Self Do you feel safe going back to the place where you live?  Yes Need for family participation in patient care:  No (Coment)  Care giving concerns:  No concerns reported at this time.   Social Worker assessment / plan:  Pt hospitalized on 03/04/15 from home with CAP. PN reviewed. Pt is legally blind. PT has recommended SNF placement at d/c. CSW has met with pt to assist with d/c planning. PT recommendations reviewed with pt. Pt reports that he has assistance with care at home. Pt has declined SNF placement at this time. CSW is available to assist with SNF placement if pt changes his mind and will accept placement. RNCM will assist with d/c planning to home.  Employment status:  Retired Insurance information:  Medicare PT Recommendations:  Skilled Nursing Facility Information / Referral to community resources:   (Pt has declined info.)  Patient/Family's Response to care:  Pt reports that he was managing well at home, with assistance, prior to hospitalization. Pt reports he will let CSW know if has  changed his mind and is willing to consider ST Rehab. Pt declined to allow CSW to initiate SNF search just in case he did change his mind.  Patient/Family's Understanding of and Emotional  Response to Diagnosis, Current Treatment, and Prognosis:   Pt reports that he's starting to feel a little better. " I don't know when I'll be discharged. I know I don't want to go to a nursing home. " Emotional Assessment Appearance:  Appears stated age Attitude/Demeanor/Rapport:  Other (cooperative) Affect (typically observed):  Calm, Pleasant Orientation:  Oriented to Self, Oriented to Place, Oriented to  Time, Oriented to Situation Alcohol / Substance use:  Not Applicable Psych involvement (Current and /or in the community):  No (Comment)  Discharge Needs  Concerns to be addressed:  Discharge Planning Concerns Readmission within the last 30 days:  No Current discharge risk:  None Barriers to Discharge:  No Barriers Identified   Haidinger, Jamie Lee, LCSW  [phone] 03/09/2015, 2:16 PM  

## 2015-03-09 NOTE — Progress Notes (Signed)
Speech Language Pathology Treatment: Dysphagia  Patient Details Name: Harold [name] MRN: [id number] DOB: [birth year] Today's Date: 03/09/2015 Time: 4128-7867 SLP Time Calculation (min) (ACUTE ONLY): 13 min  Assessment / Plan / Recommendation Clinical Impression  Orders for full liquids started.  RN reports that pt tolerated bfast and lunch with no overt coughing.  Consumed full liquids with min cues from SLP for rate and intermittent throat clearing.  Lungs diminished. Pt with chronic dysphagia and inherent aspiration risk, although subjectively appears to be tolerating today.  Will continue to follow for precautions, education.  Pt pleasant and talkative, denies difficulty.    HPI Other Pertinent Information: Harold [name] is a 81 y.o. male the past medical history of CAD, CABG, chronic diastolic CHF, heavy former smoker, CKD 3, Parkinson's disease, memory loss, macular degeneration presents with shortness of breath. Patient reports productive cough with thick yellow phlegm and progressive shortness of breath for the last 2 weeks. He is a poor historian, lives in alone and has a caregiver during the daytime. In the emergency room he was found to be hypoxic, tachycardic with a temperature of 102.4, chest x-ray with extensive right middle and lower lobe pneumonia concerning of aspiration etiology. ST to evaluate current swallow function - pt underwent BSE 11/4 and was started on regular/thin diet.  On 11/6 pt with respiratory failure - requiring ventilator support.  Pt was extubated yesterday 11/7.  Repeat swallow eval ordered.  Given h/o dysphagia and complex medical course, rec MBS.   Pt underwent MBS in 07/2014 as an OP.     Pertinent Vitals Pain Assessment: No/denies pain  SLP Plan  Continue with current plan of care    Recommendations Diet recommendations:  (continue full liquids) Liquids provided via: No straw Medication Administration: Crushed with puree Compensations: Slow  rate;Small sips/bites              Oral Care Recommendations: Oral care BID Plan: Continue with current plan of care    Neetu Carrozza L. Tivis Ringer, [place] CCC/SLP Pager [phone]      Juan Quam Laurice 03/09/2015, 2:14 PM

## 2015-03-09 NOTE — Progress Notes (Signed)
Called E-link to notify of patient's potassium and magnesium levels this am. Spoke with E-link secretary was told message with be left with MD. Reported to oncoming RN to follow up with attending MD this am.

## 2015-03-09 NOTE — Progress Notes (Signed)
ANTIBIOTIC CONSULT NOTE - INITIAL   Pharmacy Consult for Levaquin Lovenox for VTE prophylaxis Indication: PNA & UTI  No Known Allergies  Patient Measurements: Height: 5\' 11"  (180.3 cm) Weight: 167 lb 5.3 oz (75.9 kg) IBW/kg (Calculated) : 75.3  Vital Signs: Temp: 98.5 F (36.9 C) (11/09 0800) Temp Source: Oral (11/09 0800) BP: 147/59 mmHg (11/09 0800) Pulse Rate: 81 (11/09 0800) Intake/Output from previous day: 11/08 0701 - 11/09 0700 In: 540 [IV Piggyback:450] Out: 875 [Urine:875] Intake/Output from this shift: Total I/O In: 20 [Other:20] Out: -   Labs:  Recent Labs  03/07/15 0330 03/08/15 0345 03/09/15 0350  WBC 11.9* 8.5 8.1  HGB 9.9* 10.2* 11.3*  PLT 166 166 179  CREATININE 2.19* 2.43* 2.19*   Estimated Creatinine Clearance: 27.2 mL/min (by C-G formula based on Cr of 2.19). No results for input(s): VANCOTROUGH, VANCOPEAK, VANCORANDOM, GENTTROUGH, GENTPEAK, GENTRANDOM, TOBRATROUGH, TOBRAPEAK, TOBRARND, AMIKACINPEAK, AMIKACINTROU, AMIKACIN in the last 72 hours.   Microbiology: Recent Results (from the past 720 hour(s))  Culture, blood (routine x 2)     Status: None (Preliminary result)   Collection Time: 03/04/15  1:55 PM  Result Value Ref Range Status   Specimen Description BLOOD LEFT ANTECUBITAL  Final   Special Requests IN PEDIATRIC BOTTLE 5CC  Final   Culture   Final    NO GROWTH 4 DAYS Performed at [facility]    Report Status PENDING  Incomplete  Culture, blood (routine x 2)     Status: None (Preliminary result)   Collection Time: 03/04/15  2:51 PM  Result Value Ref Range Status   Specimen Description BLOOD RIGHT ANTECUBITAL  Final   Special Requests BOTTLES DRAWN AEROBIC AND ANAEROBIC 5CC EACH  Final   Culture   Final    NO GROWTH 4 DAYS Performed at [facility]    Report Status PENDING  Incomplete  Urine culture     Status: None   Collection Time: 03/04/15  6:15 PM  Result Value Ref Range Status   Specimen Description  URINE, RANDOM  Final   Special Requests NONE  Final   Culture   Final    80,000 COLONIES/ml ENTEROCOCCUS SPECIES Performed at [facility]    Report Status 03/07/2015 FINAL  Final   Organism ID, Bacteria ENTEROCOCCUS SPECIES  Final      Susceptibility   Enterococcus species - MIC*    AMPICILLIN <=2 SENSITIVE Sensitive     LEVOFLOXACIN 0.5 SENSITIVE Sensitive     NITROFURANTOIN <=16 SENSITIVE Sensitive     VANCOMYCIN 2 SENSITIVE Sensitive     * 80,000 COLONIES/ml ENTEROCOCCUS SPECIES  Culture, respiratory (NON-Expectorated)     Status: None   Collection Time: 03/06/15 12:56 PM  Result Value Ref Range Status   Specimen Description TRACHEAL ASPIRATE  Final   Special Requests NONE  Final   Gram Stain   Final    MODERATE WBC PRESENT,BOTH PMN AND MONONUCLEAR NO SQUAMOUS EPITHELIAL CELLS SEEN NO ORGANISMS SEEN Performed at [facility]    Culture   Final    Non-Pathogenic Oropharyngeal-type Flora Isolated. Performed at [facility]    Report Status 03/08/2015 FINAL  Final  MRSA PCR Screening     Status: None   Collection Time: 03/06/15  1:00 PM  Result Value Ref Range Status   MRSA by PCR NEGATIVE NEGATIVE Final    Comment:        The GeneXpert MRSA Assay (FDA approved for NASAL specimens only), is one component  of a comprehensive MRSA colonization surveillance program. It is not intended to diagnose MRSA infection nor to guide or monitor treatment for MRSA infections.    Medical History: Past Medical History  Diagnosis Date  . CAD (coronary artery disease)     a. no known MImultiple PCIs;  b. S/P CABG;  c. all vein grafts occluded, LIMA patent by cath 08/2008.  Marland Kitchen Gout   . Macular degeneration     followed at [facility]  . PVD (peripheral vascular disease) ([place])   . OSA (obstructive sleep apnea)     using CPAP  . Diastolic heart failure     a. 08/2013 Echo: EF 60-65%, mild LVH, Gr 2 DD, mod MR.  Marland Kitchen Hypertension   . Hyperlipidemia   . Depression      symptoms  . CKD (chronic kidney disease), stage IV ([place])   . Renal adenoma     hx  . Nephrolithiasis     hx   . Hx of skin cancer, basal cell   . AAA (abdominal aortic aneurysm) ([place]) 2002    3.2 cm, with repair  . History of tobacco use     Remote (same for alcohol use)  . Claudication [facility])     a. 08/2013 ABI's R: 0.56, L 0.60 - f/u with Dr. Gwenlyn Found scheduled.  . Bradycardia     a. 08/2013  . Hypotension     a. 08/2013  . CKD (chronic kidney disease) stage 4, GFR 15-29 ml/min (HCC) 10/04/2013  . OSA on CPAP 10/04/2013  . Lower extremity ulceration (HCC)     right medial malleolus, healed  . TESTOSTERONE DEFICIENCY 11/25/2006    Qualifier: Diagnosis of  By: Teryl Lucy RN, Darnell Level   . Solitary pulmonary nodule 04/26/2009    04/28/09 Ct- No worrisome pulmonary nodules.     Assessment: 28 YOM presenting 11/4 to ED with shortness of breath.  Known to pharmacy for dosing heparin gtt for NSTEMI. Decompensated 11/6 requiring intubation and transfer to ICU.  Pharmacy asked to dose vancomycin and zosyn 11/5, previously on ceftriaxone and azithromycin. 11/6 CXR worsening aeration of both lungs.   11/5 >> ceftriaxone  >> 11/6 11/5 >> azithromcyin  >> 11/6 11/6 >> vancomycin  >> 11/8 11/6 >> pip/tazo  >>  11/9 11/9 >> Levaquin >>  11/4 blood: NGTD 11/4 urine: Enterococcus, pan-sens (Amp/Levaquin/Vanc)  11/4 strep Ag: neg 11/4 legionella Ag: neg  Renal: AKI on CKD, CrCl < 62ml/min WBC wnl, Tmax 100.6  Heparin to Lovenox for VTE prophylaxis/Cards  Goal of Therapy:  Narrow abx, dose and schedule appropriate for renal function  Plan:    Discontinue Zosyn, begin Levaquin for PNA/UTI  Levaquin 750mg  x1, then 500mg  q48h  Continue Lovenox 30mg  daily, dose reduced for CKD  Will d/c protocol for VTE prophy, will adjust Lovenox as needed  Minda Ditto PharmD Pager [phone] 03/09/2015, 10:24 AM

## 2015-03-09 NOTE — Progress Notes (Signed)
[place] Progress Note Patient Name: Harold [name] DOB: Nov 17, 1928 MRN: [id number]   Date of Service  03/09/2015  HPI/Events of Note  RN calling saying very brief sinus pause obserd. On subsequent 12 lead EKG - QTc interval ju> 570msec   Recent Labs Lab 03/04/15 1359 03/05/15 0255 03/06/15 0320 03/07/15 0330 03/08/15 0345  NA 140 136 135 139 143  K 3.9 4.6 3.8 3.7 3.4*  CL 106 103 104 106 107  CO2 25 26 22 24 26   GLUCOSE 94 78 87 85 81  BUN 31* 37* 35* 43* 44*  CREATININE 2.00* 2.33* 2.09* 2.19* 2.43*  CALCIUM 8.7* 8.3* 8.4* 8.3* 8.4*  MG  --   --   --  1.9  --   PHOS  --   --   --  3.2  --      eICU Interventions  Mag sulfate 1gm     Intervention Category Intermediate Interventions: Arrhythmia - evaluation and management  Tracen Mahler 03/09/2015, 12:22 AM

## 2015-03-10 ENCOUNTER — Inpatient Hospital Stay (HOSPITAL_COMMUNITY): Payer: Medicare Other

## 2015-03-10 DIAGNOSIS — J9601 Acute respiratory failure with hypoxia: Secondary | ICD-10-CM | POA: Insufficient documentation

## 2015-03-10 LAB — BASIC METABOLIC PANEL
ANION GAP: 9 (ref 5–15)
BUN: 35 mg/dL — ABNORMAL HIGH (ref 6–20)
CO2: 26 mmol/L (ref 22–32)
Calcium: 8.7 mg/dL — ABNORMAL LOW (ref 8.9–10.3)
Chloride: 105 mmol/L (ref 101–111)
Creatinine, Ser: 2.02 mg/dL — ABNORMAL HIGH (ref 0.61–1.24)
GFR calc Af Amer: 33 mL/min — ABNORMAL LOW (ref >60)
GFR, EST NON AFRICAN AMERICAN: 29 mL/min — AB (ref >60)
GLUCOSE: 108 mg/dL — AB (ref 65–99)
POTASSIUM: 3.9 mmol/L (ref 3.5–5.1)
Sodium: 140 mmol/L (ref 135–145)

## 2015-03-10 LAB — PHOSPHORUS: Phosphorus: 2.6 mg/dL (ref 2.5–4.6)

## 2015-03-10 LAB — CBC
HEMATOCRIT: 34.4 % — AB (ref 39.0–52.0)
HEMOGLOBIN: 11.1 g/dL — AB (ref 13.0–17.0)
MCH: 26.9 pg (ref 26.0–34.0)
MCHC: 32.3 g/dL (ref 30.0–36.0)
MCV: 83.5 fL (ref 78.0–100.0)
Platelets: 180 10*3/uL (ref 150–400)
RBC: 4.12 MIL/uL — AB (ref 4.22–5.81)
RDW: 16.5 % — ABNORMAL HIGH (ref 11.5–15.5)
WBC: 9.9 10*3/uL (ref 4.0–10.5)

## 2015-03-10 LAB — PROCALCITONIN: PROCALCITONIN: 3.25 ng/mL

## 2015-03-10 MED ORDER — LEVOFLOXACIN IN D5W 750 MG/150ML IV SOLN
750.0000 mg | INTRAVENOUS | Status: DC
  Administered 2015-03-11 – 2015-03-13 (×2): 750 mg via INTRAVENOUS
  Filled 2015-03-10 (×2): qty 150

## 2015-03-10 NOTE — Care Management Important Message (Signed)
Important Message  Patient Details  Name: MANN SOLOW MRN: [id number] Date of Birth: 1928/09/08   Medicare Important Message Given:  Yes    Camillo Flaming 03/10/2015, 12:10 PMImportant Message  Patient Details  Name: MYKAIL STOKELY MRN: [id number] Date of Birth: [birth year]   Medicare Important Message Given:  Yes    Camillo Flaming 03/10/2015, 12:09 [place] Message  Patient Details  Name: JAYVONTE KINT MRN: [id number] Date of Birth: [birth year]   Medicare Important Message Given:  Yes    Camillo Flaming 03/10/2015, 12:09 PM

## 2015-03-10 NOTE — Progress Notes (Signed)
ANTIBIOTIC CONSULT NOTE - Follow up   Pharmacy Consult for Levaquin Indication: PNA & Enterococcus UTI  No Known Allergies  Patient Measurements: Height: 5\' 11"  (180.3 cm) Weight: 168 lb 14 oz (76.6 kg) IBW/kg (Calculated) : 75.3  Vital Signs: Temp: 98.1 F (36.7 C) (11/10 0506) Temp Source: Oral (11/10 0506) BP: 167/79 mmHg (11/10 0506) Pulse Rate: 78 (11/10 0506) Intake/Output from previous day: 11/09 0701 - 11/10 0700 In: 560 [P.O.:120; IV Piggyback:170] Out: 1050 [Urine:1050]  Labs:  Recent Labs  03/08/15 0345 03/09/15 0350 03/10/15 0436  WBC 8.5 8.1 9.9  HGB 10.2* 11.3* 11.1*  PLT 166 179 180  CREATININE 2.43* 2.19* 2.02*   Estimated Creatinine Clearance: 29.5 mL/min (by C-G formula based on Cr of 2.02). No results for input(s): VANCOTROUGH, VANCOPEAK, VANCORANDOM, GENTTROUGH, GENTPEAK, GENTRANDOM, TOBRATROUGH, TOBRAPEAK, TOBRARND, AMIKACINPEAK, AMIKACINTROU, AMIKACIN in the last 72 hours.   Microbiology: Recent Results (from the past 720 hour(s))  Culture, blood (routine x 2)     Status: None   Collection Time: 03/04/15  1:55 PM  Result Value Ref Range Status   Specimen Description BLOOD LEFT ANTECUBITAL  Final   Special Requests IN PEDIATRIC BOTTLE 5CC  Final   Culture   Final    NO GROWTH 5 DAYS Performed at [facility]    Report Status 03/09/2015 FINAL  Final  Culture, blood (routine x 2)     Status: None   Collection Time: 03/04/15  2:51 PM  Result Value Ref Range Status   Specimen Description BLOOD RIGHT ANTECUBITAL  Final   Special Requests BOTTLES DRAWN AEROBIC AND ANAEROBIC 5CC EACH  Final   Culture   Final    NO GROWTH 5 DAYS Performed at [facility]    Report Status 03/09/2015 FINAL  Final  Urine culture     Status: None   Collection Time: 03/04/15  6:15 PM  Result Value Ref Range Status   Specimen Description URINE, RANDOM  Final   Special Requests NONE  Final   Culture   Final    80,000 COLONIES/ml ENTEROCOCCUS  SPECIES Performed at [facility]    Report Status 03/07/2015 FINAL  Final   Organism ID, Bacteria ENTEROCOCCUS SPECIES  Final      Susceptibility   Enterococcus species - MIC*    AMPICILLIN <=2 SENSITIVE Sensitive     LEVOFLOXACIN 0.5 SENSITIVE Sensitive     NITROFURANTOIN <=16 SENSITIVE Sensitive     VANCOMYCIN 2 SENSITIVE Sensitive     * 80,000 COLONIES/ml ENTEROCOCCUS SPECIES  Culture, respiratory (NON-Expectorated)     Status: None   Collection Time: 03/06/15 12:56 PM  Result Value Ref Range Status   Specimen Description TRACHEAL ASPIRATE  Final   Special Requests NONE  Final   Gram Stain   Final    MODERATE WBC PRESENT,BOTH PMN AND MONONUCLEAR NO SQUAMOUS EPITHELIAL CELLS SEEN NO ORGANISMS SEEN Performed at [facility]    Culture   Final    Non-Pathogenic Oropharyngeal-type Flora Isolated. Performed at [facility]    Report Status 03/08/2015 FINAL  Final  MRSA PCR Screening     Status: None   Collection Time: 03/06/15  1:00 PM  Result Value Ref Range Status   MRSA by PCR NEGATIVE NEGATIVE Final    Comment:        The GeneXpert MRSA Assay (FDA approved for NASAL specimens only), is one component of a comprehensive MRSA colonization surveillance program. It is not intended to diagnose MRSA infection  nor to guide or monitor treatment for MRSA infections.    Assessment: Harold [name] presenting 11/4 to ED with shortness of breath.  Known to pharmacy for dosing heparin gtt for NSTEMI. Decompensated 11/6 requiring intubation and transfer to ICU.  Initially started on ceftriaxone and azithromycin, Pharmacy was consulted to dose vancomycin and zosyn 11/5 .  Antibiotics are now narrowed to Levaquin, pharmacy to dose.  11/5 >> ceftriaxone  >> 11/6 11/5 >> azithromcyin  >> 11/6 11/6 >> vancomycin  >> 11/8 11/6 >> pip/tazo  >>  11/9 11/9 >> Levaquin >>  Today, 03/10/2015: Tmax 99 WBC decr to wnl SCr improved to 2.02, CrCl ~ 29 ml/min   Goal of  Therapy:  Appropriate abx dosing, eradication of infection.  Plan:    Levaquin 750mg  IV q48h Follow up renal fxn, culture results, and clinical course.   Gretta Arab PharmD, BCPS Pager [phone] 03/10/2015 10:02 AM

## 2015-03-10 NOTE — Progress Notes (Addendum)
Speech Language Pathology Treatment: Dysphagia  Patient Details Name: JEREMMY BLAESING MRN: [id number] DOB: 04-09-1929 Today's Date: 03/10/2015 Time: 1430-1510 SLP Time Calculation (min) (ACUTE ONLY): 40 min  Assessment / Plan / Recommendation Clinical Impression  Pt sitting upright in chair with meal tray at bedside upon SLP arrival.   He denies having Parkinson's disease, although this is clearly written in his history.    SLP reviewed findings of MBS both in April 2016 and during this hospital admit and reviewed worsening swallow abilty currently.  Pt now admits that he has significant coughing episodes with eating 1-2 times a week.  Upon further discussion, SLP suspects both decreased appetite and dysphagia contributing factors to pt's weight loss.    Pt observed consuming jello - first few bites tolerated well but upon 3rd bite, pt overtly coughed with expectoration of viscous secretions mixed with jello for approximately 5 minutes immediately post swallow-highly concerning for aspiration.  Vocal quality is wet at times and pt reports this to be baseline - presumed aspiration of secretions.    Using visual, verbal and auditory cues, reviewed aspiration mitigation strategies - pt benefited from max cues to carry over.  As pt reports decreased mobility b/c he has "gotten lazy", suspect aspiration and deconditioning as contributing factors to respiratory event.  As pt remains a FULL CODE but has ongoing and worsening dysphagia x a few years with his Parkinson's, would recommend to begin to address goals of care.   SLP reviewed chronic dysphagia/aspiration ramifications including possible malnutrition, dehydration and recurrent pulmonary issues.   Pt did express that he desires to continue to eat with known aspiration.  At this time, do not recommend to advance from full liquids due to amount of weakness.  SLP to follow up and recommend follow up at next venue of care to maximize swallow  rehabilitation post current respiratory event.    HPI HPI: NAHIR DAURIO is a 81 y.o. male the past medical history of CAD, CABG, chronic diastolic CHF, heavy former smoker, CKD 3, Parkinson's disease, memory loss, macular degeneration presents with shortness of breath. Patient reports productive cough with thick yellow phlegm and progressive shortness of breath for the last 2 weeks. He is a poor historian, lives in alone and has a caregiver during the daytime. In the emergency room he was found to be hypoxic, tachycardic with a temperature of 102.4, chest x-ray with extensive right middle and lower lobe pneumonia concerning of aspiration etiology. ST to evaluate current swallow function - pt underwent BSE 11/4 and was started on regular/thin diet.  On 11/6 pt with respiratory failure - requiring ventilator support.  Pt was extubated yesterday 11/7.  Repeat swallow eval ordered.  Given h/o dysphagia and complex medical course, rec MBS.   Pt underwent MBS in 07/2014 as an OP.        SLP Plan  Continue with current plan of care     Recommendations  Diet recommendations: Thin liquid (thin liquids, full liquids) Liquids provided via: Cup;No straw Medication Administration: Crushed with puree Supervision: Patient able to self feed;Full supervision/cueing for compensatory strategies Compensations: Multiple dry swallows after each bite/sip;Hard cough after swallow Postural Changes and/or Swallow Maneuvers: Seated upright 90 degrees;Upright 30-60 min after meal              Oral Care Recommendations: Oral care BID Follow up Recommendations: Skilled Nursing facility Plan: Continue with current plan of care   Luanna Salk, [facility] SLP [phone]

## 2015-03-10 NOTE — Progress Notes (Signed)
Patient ID: Harold [name], male   DOB: 10-Jun-1928, 81 y.o.   MRN: [id number]  [facility] PROGRESS NOTE  Harold [name] [id number] DOB: 07-19-1928 DOA: 03/04/2015 PCP: Garret Reddish, MD   Brief narrative:    81 yo male presented with progressive dyspnea and cough with yellow sputum 2nd to PNA. Developed respiratory failure and intubated 11/5 for hypercarbic resp failure  STUDIES:  11/05 Echo >> EF 50 to 55%, mod LVH, grade 2 diastolic dysfx, mod MR, PAS 42 mmHg  SIGNIFICANT EVENTS: 11/04 Admit, cardiology consult, heparin gtt 11/06 transfer to ICU 11/10 [place] assumed care from PCCM  Assessment/Plan:    Principal Problem:   Acute on chronic respiratory failure ([place]) with hypoxia and hypercarbia  - secondary to aspiration and PNA, RML and RLL - required intubation from 11/06 - 11/07 - continue PRN BD, ambulation encouraged  - provide pulmonary hygiene   Active Problems:   Sepsis secondary to PNA, RML and RLL, also enterococcus UTI - Zosyn 11/6>>>11/10 - Levaquin 11/10>>>  - Urine 11/04 >> 80 K enterococcus - Blood 11/04 >> neg - Sputum 11/06 >> oral flora    NSTEMI  - hx of CAD s/p CABG - all SVGs occluded on cath 2010 - no chest pain this AM - no need for cycling troponins    Acute on chronic diastolic CHF - continue with Lasix 40 mg IV QD - weight is 76 kg this AM - monitor daily weight, strict I/O    Essential hypertension - reasonable inpatient control     CKD stage III. GFR 30-40 - Cr continues trending down  - BMP In AM    Hypokalemia - supplemented and WNL this AM - BMP In AM    Parkinson's disease (HCC) - PT eval done, will need SNF In AM    Dysphagia - confirmed on MBS 11/9 - Liquids only per ST  - Ongoing speech therapy     Hx of Depression. - Hold bupropion  DVT prophylaxis - Lovenox SQ  Code Status: Full.  Family Communication:  plan of care discussed with the patient Disposition Plan: Home when stable.   IV  access:  Peripheral IV  Procedures and diagnostic studies:     Dg Chest Port 1 View 03/10/2015  Improved pulmonary edema since 2 days ago. 2. Right lung opacity may reflect the reported pneumonia or asymmetric edema.   Dg Chest Port 1 View 03/08/2015  Interval increase in conspicuity of bilateral interstitial and alveolar opacities consistent with pneumonia and/or pulmonary edema. Interval extubation of the trachea.   Dg Chest Port 1 View 03/07/2015  Stable positioning of endotracheal and orogastric tubes. 2. Improved pulmonary edema since yesterday. 3. Right basilar pneumonia.  Dg Chest Port 1 View 03/06/2015   Worsening aeration of both lungs compared with previous exam.  Medical Consultants:  None  Other Consultants:  PT SLP  IAnti-Infectives:   Bobbye Charleston, MD  [place] Pager [phone]  If 7PM-7AM, please contact night-coverage [email or web address] Password [email or web address] 03/10/2015, 7:04 AM   LOS: 6 days   HPI/Subjective: No events overnight.   Objective: Filed Vitals:   03/09/15 1600 03/09/15 1732 03/09/15 2132 03/10/15 0506  BP:  186/66 172/67 167/79  Pulse:  91 93 78  Temp: 98.7 F (37.1 C) 99 F (37.2 C) 98.6 F (37 C) 98.1 F (36.7 C)  TempSrc: Oral Oral Oral Oral  Resp:  18 18 18   Height:      Weight:    76.6 kg (  168 lb 14 oz)  SpO2:  92% 93% 96%    Intake/Output Summary (Last 24 hours) at 03/10/15 0704 Last data filed at 03/10/15 [id number]  Gross per 24 hour  Intake    560 ml  Output   1050 ml  Net   -490 ml    Exam:   General:  Pt is alert, follows commands appropriately, not in acute distress  Cardiovascular: Regular rate and rhythm, no rubs, no gallops  Respiratory: Clear to auscultation bilaterally, no wheezing, no crackles, no rhonchi  Abdomen: Soft, non tender, non distended, bowel sounds present, no guarding  Data Reviewed: Basic Metabolic Panel:  Recent Labs Lab 03/06/15 0320 03/07/15 0330 03/08/15 0345 03/09/15 0350  03/10/15 0436  NA 135 139 143 141 140  K 3.8 3.7 3.4* 3.0* 3.9  CL 104 106 107 105 105  CO2 22 24 26 27 26   GLUCOSE 87 85 81 103* 108*  BUN 35* 43* 44* 39* 35*  CREATININE 2.09* 2.19* 2.43* 2.19* 2.02*  CALCIUM 8.4* 8.3* 8.4* 8.8* 8.7*  MG  --  1.9  --  2.5*  --   PHOS  --  3.2  --  2.9 2.6   Liver Function Tests:  Recent Labs Lab 03/04/15 1359 03/07/15 0330  AST 21 18  ALT 8* 9*  ALKPHOS 64 45  BILITOT 1.1 1.1  PROT 5.9* 5.2*  ALBUMIN 3.2* 2.3*   CBC:  Recent Labs Lab 03/04/15 1359  03/06/15 0320 03/07/15 0330 03/08/15 0345 03/09/15 0350 03/10/15 0436  WBC 10.5  < > 13.8* 11.9* 8.5 8.1 9.9  NEUTROABS 9.3*  --   --   --   --   --   --   HGB 12.5*  < > 10.7* 9.9* 10.2* 11.3* 11.1*  HCT 38.8*  < > 33.0* 31.0* 31.5* 35.9* 34.4*  MCV 84.7  < > 84.0 85.4 84.7 84.9 83.5  PLT 176  < > 162 166 166 179 180  < > = values in this interval not displayed. Cardiac Enzymes:  Recent Labs Lab 03/04/15 2220 03/05/15 0255 03/05/15 1215 03/05/15 2202 03/06/15 0320  TROPONINI 6.75* 8.43* 6.19* 4.75* 4.29*   Recent Results (from the past 240 hour(s))  Culture, blood (routine x 2)     Status: None   Collection Time: 03/04/15  1:55 PM  Result Value Ref Range Status   Specimen Description BLOOD LEFT ANTECUBITAL  Final   Special Requests IN PEDIATRIC BOTTLE 5CC  Final   Culture   Final    NO GROWTH 5 DAYS Performed at [facility]    Report Status 03/09/2015 FINAL  Final  Culture, blood (routine x 2)     Status: None   Collection Time: 03/04/15  2:51 PM  Result Value Ref Range Status   Specimen Description BLOOD RIGHT ANTECUBITAL  Final   Special Requests BOTTLES DRAWN AEROBIC AND ANAEROBIC 5CC EACH  Final   Culture   Final    NO GROWTH 5 DAYS Performed at [facility]    Report Status 03/09/2015 FINAL  Final  Urine culture     Status: None   Collection Time: 03/04/15  6:15 PM  Result Value Ref Range Status   Specimen Description URINE, RANDOM  Final    Special Requests NONE  Final   Culture   Final    80,000 COLONIES/ml ENTEROCOCCUS SPECIES Performed at [facility]    Report Status 03/07/2015 FINAL  Final   Organism ID, Bacteria ENTEROCOCCUS SPECIES  Final      Susceptibility   Enterococcus species - MIC*    AMPICILLIN <=2 SENSITIVE Sensitive     LEVOFLOXACIN 0.5 SENSITIVE Sensitive     NITROFURANTOIN <=16 SENSITIVE Sensitive     VANCOMYCIN 2 SENSITIVE Sensitive     * 80,000 COLONIES/ml ENTEROCOCCUS SPECIES  Culture, respiratory (NON-Expectorated)     Status: None   Collection Time: 03/06/15 12:56 PM  Result Value Ref Range Status   Specimen Description TRACHEAL ASPIRATE  Final   Special Requests NONE  Final   Gram Stain   Final    MODERATE WBC PRESENT,BOTH PMN AND MONONUCLEAR NO SQUAMOUS EPITHELIAL CELLS SEEN NO ORGANISMS SEEN Performed at [facility]    Culture   Final    Non-Pathogenic Oropharyngeal-type Flora Isolated. Performed at [facility]    Report Status 03/08/2015 FINAL  Final  MRSA PCR Screening     Status: None   Collection Time: 03/06/15  1:00 PM  Result Value Ref Range Status   MRSA by PCR NEGATIVE NEGATIVE Final    Comment:        The GeneXpert MRSA Assay (FDA approved for NASAL specimens only), is one component of a comprehensive MRSA colonization surveillance program. It is not intended to diagnose MRSA infection nor to guide or monitor treatment for MRSA infections.      Scheduled Meds: . antiseptic oral rinse  7 mL Mouth Rinse BID  . aspirin EC  81 mg Oral Daily  . chlorhexidine gluconate  15 mL Mouth Rinse BID  . clopidogrel  75 mg Per Tube Daily  . enoxaparin (LOVENOX) injection  30 mg Subcutaneous Q24H  . furosemide  40 mg Intravenous Daily  . [START ON 03/11/2015] levofloxacin (LEVAQUIN) IV  500 mg Intravenous Q48H  . pantoprazole sodium  40 mg Per Tube Daily  . simvastatin  20 mg Per Tube Daily  . sodium chloride  1,000 mL Intravenous Once   Continuous  Infusions:

## 2015-03-10 NOTE — Progress Notes (Addendum)
Physical Therapy Treatment Patient Details Name: Harold [name] MRN: [id number] DOB: 08-14-1928 Today's Date: 03/10/2015    History of Present Illness Pt admitted with dx of respiratory failure and hx of CAD, PVD, macular degenerations, CKD, CABG and Parkinsons    PT Comments    Pt continues to require +2 assist for mobility. High risk for falls! Do not feel pt can safely manage at home at this time. Highly recommend SNF.   Follow Up Recommendations  SNF; 24 hour supervision/assist     Equipment Recommendations  None recommended by PT    Recommendations for Other Services       Precautions / Restrictions Precautions Precautions: Fall Precaution Comments: Visually impaired Restrictions Weight Bearing Restrictions: No    Mobility  Bed Mobility Overal bed mobility: Needs Assistance Bed Mobility: Supine to Sit     Supine to sit: Mod assist     General bed mobility comments: Assist for trunk and bil LEs. Utilized bedpad for scooting, positioning.   Transfers Overall transfer level: Needs assistance Equipment used: Rolling walker (2 wheeled) Transfers: Sit to/from Stand Sit to Stand: Mod assist;+2 physical assistance;+2 safety/equipment;From elevated surface         General transfer comment: x2. Assist to rise, stabilize, control descent. Pt with difficulty flexing knees to allow for good BOS for sit to stand.   Ambulation/Gait Ambulation/Gait assistance: Min assist;+2 physical assistance;+2 safety/equipment Ambulation Distance (Feet): 50 Feet Assistive device: Rolling walker (2 wheeled) Gait Pattern/deviations: Step-through pattern;Trunk flexed;Shuffle     General Gait Details: cues for posture, pacing, step length, position from RW. Assist to stabilize pt and maneuver with walker. Followed closely with recliner.   Stairs            Wheelchair Mobility    Modified Rankin (Stroke Patients Only)       Balance           Standing balance  support: Bilateral upper extremity supported;During functional activity Standing balance-Leahy Scale: Poor                      Cognition Arousal/Alertness: Awake/alert Behavior During Therapy: WFL for tasks assessed/performed Overall Cognitive Status: Within Functional Limits for tasks assessed                      Exercises      General Comments        Pertinent Vitals/Pain Pain Assessment: Faces Faces Pain Scale: Hurts even more Pain Location: bil knees, ankles Pain Descriptors / Indicators: Sore Pain Intervention(s): Monitored during session;Repositioned    Home Living                      Prior Function            PT Goals (current goals can now be found in the care plan section) Progress towards PT goals: Progressing toward goals (very slowly)    Frequency  Min 3X/week    PT Plan Current plan remains appropriate    Co-evaluation             End of Session Equipment Utilized During Treatment: Gait belt Activity Tolerance: Patient limited by fatigue Patient left: in chair;with call bell/phone within reach;with chair alarm set. Loose BM during session-made RN aware     Time: [id number] PT Time Calculation (min) (ACUTE ONLY): 31 min  Charges:  $Gait Training: 8-22 mins $Therapeutic Activity: 8-22 mins  G Codes:      Weston Anna, MPT Pager: [phone]

## 2015-03-11 LAB — CBC
HCT: 33.3 % — ABNORMAL LOW (ref 39.0–52.0)
HEMOGLOBIN: 10.8 g/dL — AB (ref 13.0–17.0)
MCH: 27.1 pg (ref 26.0–34.0)
MCHC: 32.4 g/dL (ref 30.0–36.0)
MCV: 83.7 fL (ref 78.0–100.0)
Platelets: 171 10*3/uL (ref 150–400)
RBC: 3.98 MIL/uL — AB (ref 4.22–5.81)
RDW: 16.7 % — ABNORMAL HIGH (ref 11.5–15.5)
WBC: 6.3 10*3/uL (ref 4.0–10.5)

## 2015-03-11 LAB — BASIC METABOLIC PANEL
Anion gap: 8 (ref 5–15)
BUN: 35 mg/dL — ABNORMAL HIGH (ref 6–20)
CHLORIDE: 106 mmol/L (ref 101–111)
CO2: 26 mmol/L (ref 22–32)
CREATININE: 1.76 mg/dL — AB (ref 0.61–1.24)
Calcium: 8.8 mg/dL — ABNORMAL LOW (ref 8.9–10.3)
GFR calc non Af Amer: 34 mL/min — ABNORMAL LOW (ref >60)
GFR, EST AFRICAN AMERICAN: 39 mL/min — AB (ref >60)
Glucose, Bld: 93 mg/dL (ref 65–99)
POTASSIUM: 3.2 mmol/L — AB (ref 3.5–5.1)
SODIUM: 140 mmol/L (ref 135–145)

## 2015-03-11 MED ORDER — FUROSEMIDE 40 MG PO TABS
40.0000 mg | ORAL_TABLET | Freq: Every day | ORAL | Status: DC
  Administered 2015-03-12 – 2015-03-13 (×2): 40 mg via ORAL
  Filled 2015-03-11 (×2): qty 1

## 2015-03-11 MED ORDER — ENOXAPARIN SODIUM 40 MG/0.4ML ~~LOC~~ SOLN
40.0000 mg | SUBCUTANEOUS | Status: DC
  Administered 2015-03-12 – 2015-03-13 (×2): 40 mg via SUBCUTANEOUS
  Filled 2015-03-11 (×2): qty 0.4

## 2015-03-11 MED ORDER — POTASSIUM CHLORIDE CRYS ER 20 MEQ PO TBCR
40.0000 meq | EXTENDED_RELEASE_TABLET | Freq: Once | ORAL | Status: AC
  Administered 2015-03-11: 40 meq via ORAL
  Filled 2015-03-11: qty 2

## 2015-03-11 MED ORDER — ENOXAPARIN SODIUM 30 MG/0.3ML ~~LOC~~ SOLN
30.0000 mg | SUBCUTANEOUS | Status: DC
  Administered 2015-03-11: 30 mg via SUBCUTANEOUS
  Filled 2015-03-11: qty 0.3

## 2015-03-11 NOTE — Progress Notes (Signed)
Patient ID: Harold [name], male   DOB: 02-07-1929, 81 y.o.   MRN: [id number]  [facility] PROGRESS NOTE  Harold [name] [id number] DOB: [birth year] DOA: 03/04/2015 PCP: Garret Reddish, MD   Brief narrative:    81 yo male presented with progressive dyspnea and cough with yellow sputum 2nd to PNA. Developed respiratory failure and intubated 11/5 for hypercarbic resp failure  STUDIES:  11/05 Echo >> EF 50 to 55%, mod LVH, grade 2 diastolic dysfx, mod MR, PAS 42 mmHg  SIGNIFICANT EVENTS: 11/04 Admit, cardiology consult, heparin gtt 11/06 transfer to ICU 11/10 [place] assumed care from PCCM  Assessment/Plan:    Principal Problem:   Acute on chronic respiratory failure ([place]) with hypoxia and hypercarbia  - secondary to aspiration and PNA, RML and RLL - required intubation from 11/06 - 11/07 - continue PRN BD, ambulation encouraged  - provide pulmonary hygiene  - respiratory status stable this AM  Active Problems:   Sepsis secondary to PNA, RML and RLL, also enterococcus UTI - sepsis etiology resolved  - Zosyn 11/6>>>11/10 - Levaquin 11/10>>>  - Urine 11/04 >> 80 K enterococcus - Blood 11/04 >> neg - Sputum 11/06 >> oral flora    NSTEMI  - hx of CAD s/p CABG - all SVGs occluded on cath 2010 - no chest pain this AM - no need for cycling troponins    Acute on chronic diastolic CHF - continue with Lasix 40 mg but change to PO  - weight trending: 76 kg --> 75 kg this AM - monitor daily weight, strict I/O    Essential hypertension - SBP > 155 this AM - add hydralazine as needed for now     CKD stage III. GFR 30-40 - Cr continues trending down  - BMP In AM    Hypokalemia - continue to supplement  - BMP In AM    Parkinson's disease (HCC) - PT eval done, will need SNF, placement pending     Dysphagia - confirmed on MBS 11/9 - Liquids only per ST  - Ongoing speech therapy     Hx of Depression. - Hold bupropion  DVT prophylaxis - Lovenox  SQ  Code Status: Full.  Family Communication:  plan of care discussed with the patient Disposition Plan: SNF in 24 - 48 hours   IV access:  Peripheral IV  Procedures and diagnostic studies:     Dg Chest Port 1 View 03/10/2015  Improved pulmonary edema since 2 days ago. 2. Right lung opacity may reflect the reported pneumonia or asymmetric edema.   Dg Chest Port 1 View 03/08/2015  Interval increase in conspicuity of bilateral interstitial and alveolar opacities consistent with pneumonia and/or pulmonary edema. Interval extubation of the trachea.   Dg Chest Port 1 View 03/07/2015  Stable positioning of endotracheal and orogastric tubes. 2. Improved pulmonary edema since yesterday. 3. Right basilar pneumonia.  Dg Chest Port 1 View 03/06/2015   Worsening aeration of both lungs compared with previous exam.  Medical Consultants:  None  Other Consultants:  PT SLP  IAnti-Infectives:   Bobbye Charleston, MD  [place] Pager [phone]  If 7PM-7AM, please contact night-coverage [email or web address] Password [facility] 03/11/2015, 11:55 AM   LOS: 7 days   HPI/Subjective: No events overnight.   Objective: Filed Vitals:   03/10/15 0506 03/10/15 1300 03/10/15 2136 03/11/15 0454  BP: 167/79 168/69 165/79 166/59  Pulse: 78 84 78 74  Temp: 98.1 F (36.7 C) 98 F (36.7 C) 99.1 F (37.3 C)  99.1 F (37.3 C)  TempSrc: Oral Oral Oral Oral  Resp: 18 18 20 24   Height:      Weight: 76.6 kg (168 lb 14 oz)   75.7 kg (166 lb 14.2 oz)  SpO2: 96% 96% 94% 96%    Intake/Output Summary (Last 24 hours) at 03/11/15 1155 Last data filed at 03/11/15 1045  Gross per 24 hour  Intake    480 ml  Output   1550 ml  Net  -1070 ml    Exam:   General:  Pt is alert, follows commands appropriately, not in acute distress  Cardiovascular: Regular rate and rhythm, no rubs, no gallops  Respiratory: Clear to auscultation bilaterally, no wheezing, no crackles, no rhonchi  Abdomen: Soft, non tender,  non distended, bowel sounds present, no guarding  Data Reviewed: Basic Metabolic Panel:  Recent Labs Lab 03/07/15 0330 03/08/15 0345 03/09/15 0350 03/10/15 0436 03/11/15 0540  NA 139 143 141 140 140  K 3.7 3.4* 3.0* 3.9 3.2*  CL 106 107 105 105 106  CO2 24 26 27 26 26   GLUCOSE 85 81 103* 108* 93  BUN 43* 44* 39* 35* 35*  CREATININE 2.19* 2.43* 2.19* 2.02* 1.76*  CALCIUM 8.3* 8.4* 8.8* 8.7* 8.8*  MG 1.9  --  2.5*  --   --   PHOS 3.2  --  2.9 2.6  --    Liver Function Tests:  Recent Labs Lab 03/04/15 1359 03/07/15 0330  AST 21 18  ALT 8* 9*  ALKPHOS 64 45  BILITOT 1.1 1.1  PROT 5.9* 5.2*  ALBUMIN 3.2* 2.3*   CBC:  Recent Labs Lab 03/04/15 1359  03/07/15 0330 03/08/15 0345 03/09/15 0350 03/10/15 0436 03/11/15 0540  WBC 10.5  < > 11.9* 8.5 8.1 9.9 6.3  NEUTROABS 9.3*  --   --   --   --   --   --   HGB 12.5*  < > 9.9* 10.2* 11.3* 11.1* 10.8*  HCT 38.8*  < > 31.0* 31.5* 35.9* 34.4* 33.3*  MCV 84.7  < > 85.4 84.7 84.9 83.5 83.7  PLT 176  < > 166 166 179 180 171  < > = values in this interval not displayed. Cardiac Enzymes:  Recent Labs Lab 03/04/15 2220 03/05/15 0255 03/05/15 1215 03/05/15 2202 03/06/15 0320  TROPONINI 6.75* 8.43* 6.19* 4.75* 4.29*   Recent Results (from the past 240 hour(s))  Culture, blood (routine x 2)     Status: None   Collection Time: 03/04/15  1:55 PM  Result Value Ref Range Status   Specimen Description BLOOD LEFT ANTECUBITAL  Final   Special Requests IN PEDIATRIC BOTTLE 5CC  Final   Culture   Final    NO GROWTH 5 DAYS Performed at [facility]    Report Status 03/09/2015 FINAL  Final  Culture, blood (routine x 2)     Status: None   Collection Time: 03/04/15  2:51 PM  Result Value Ref Range Status   Specimen Description BLOOD RIGHT ANTECUBITAL  Final   Special Requests BOTTLES DRAWN AEROBIC AND ANAEROBIC 5CC EACH  Final   Culture   Final    NO GROWTH 5 DAYS Performed at [facility]    Report Status  03/09/2015 FINAL  Final  Urine culture     Status: None   Collection Time: 03/04/15  6:15 PM  Result Value Ref Range Status   Specimen Description URINE, RANDOM  Final   Special Requests NONE  Final  Culture   Final    80,000 COLONIES/ml ENTEROCOCCUS SPECIES Performed at [facility]    Report Status 03/07/2015 FINAL  Final   Organism ID, Bacteria ENTEROCOCCUS SPECIES  Final      Susceptibility   Enterococcus species - MIC*    AMPICILLIN <=2 SENSITIVE Sensitive     LEVOFLOXACIN 0.5 SENSITIVE Sensitive     NITROFURANTOIN <=16 SENSITIVE Sensitive     VANCOMYCIN 2 SENSITIVE Sensitive     * 80,000 COLONIES/ml ENTEROCOCCUS SPECIES  Culture, respiratory (NON-Expectorated)     Status: None   Collection Time: 03/06/15 12:56 PM  Result Value Ref Range Status   Specimen Description TRACHEAL ASPIRATE  Final   Special Requests NONE  Final   Gram Stain   Final    MODERATE WBC PRESENT,BOTH PMN AND MONONUCLEAR NO SQUAMOUS EPITHELIAL CELLS SEEN NO ORGANISMS SEEN Performed at [facility]    Culture   Final    Non-Pathogenic Oropharyngeal-type Flora Isolated. Performed at [facility]    Report Status 03/08/2015 FINAL  Final  MRSA PCR Screening     Status: None   Collection Time: 03/06/15  1:00 PM  Result Value Ref Range Status   MRSA by PCR NEGATIVE NEGATIVE Final    Comment:        The GeneXpert MRSA Assay (FDA approved for NASAL specimens only), is one component of a comprehensive MRSA colonization surveillance program. It is not intended to diagnose MRSA infection nor to guide or monitor treatment for MRSA infections.      Scheduled Meds: . antiseptic oral rinse  7 mL Mouth Rinse BID  . aspirin EC  81 mg Oral Daily  . chlorhexidine gluconate  15 mL Mouth Rinse BID  . clopidogrel  75 mg Per Tube Daily  . enoxaparin (LOVENOX) injection  30 mg Subcutaneous Q24H  . furosemide  40 mg Intravenous Daily  . levofloxacin (LEVAQUIN) IV  750 mg Intravenous  Q48H  . pantoprazole sodium  40 mg Per Tube Daily  . simvastatin  20 mg Per Tube Daily  . sodium chloride  1,000 mL Intravenous Once   Continuous Infusions:

## 2015-03-11 NOTE — Progress Notes (Signed)
CSW was asked to speak with patient's friend/HCPOA at bedside who is attempting to talk him into SNF- she wants him to go to SNF and he now is agreeable to considering SNF.  CSW will pursue SNF options for him as soon as possible and advise-    Eduard Clos, MSW, [place]

## 2015-03-11 NOTE — Care Management Note (Signed)
Case Management Note  Patient Details  Name: Harold [name] MRN: [id number] Date of Birth: 06/28/1928  Subjective/Objective: Spoke to patient w/family friend,POA Harold [name] in rm(patient agree to Harold [name]-c#[phone] [id number] advocating for him)I asked him if it is OK since he has a son, & patient said it's ok for Harold Lull to help w/decisons.  Discussed in detail PT recc-SNF, which is highly recommended.  Informed patient that [place] recommend HHRN/PT/OT/Nurse's aide/social worker/provided w/HHC list-instruct on skilled services,they are not there around the clock, they come,teach a skill, then they leave, intermittent services.  Patient/Harold [name] voiced understanding.  Provided w/private duty care list(independent decision, 24hr asst,private pay(out of pocket expense)-They voiced understanding. Patient has concerns about his home since he lives there [place] would manage his home if he went to a nursing home-Harold [name] explained to patient that she would continue to manage his bills, & the up keep of his home.She also helped to explain to him that his expenses would now go to the private duty care services if he decided to stay home.  Patient voiced understanding.  I did reminnded patient about medicare IM notice & rights-once medically stable for d/c they have the right to appeal for a medical reason-not about staying in the hospital to decide if they want to go home or to SNF.(they voiced understanding). I also reminded them that the Social worker has come twice to discuss SNF options-He declined twice.They  would like to talk to social worker about SNF/ALF-costs, & services.  CSW notified. [facility] is also following for community needs.                  Action/Plan:   Expected Discharge Date:                  Expected Discharge Plan:  Skilled Nursing Facility  In-House Referral:  Clinical Social Work  Discharge planning Services  CM Consult  Post Acute Care Choice:     Choice offered to:  Patient  DME Arranged:    DME Agency:     HH Arranged:    [place] Agency:     Status of Service:  In process, will continue to follow  Medicare Important Message Given:  Yes Date Medicare IM Given:    Medicare IM give by:    Date Additional Medicare IM Given:    Additional Medicare Important Message give by:     If discussed at [place] of Stay Meetings, dates discussed:    Additional Comments:  Dessa Phi, RN 03/11/2015, 1:23 PM

## 2015-03-11 NOTE — Plan of Care (Signed)
Problem: Pain Managment: Goal: General experience of comfort will improve Outcome: Progressing Denies c/o pain or discomfort.   

## 2015-03-11 NOTE — Consult Note (Signed)
   [facility] CM Inpatient Consult   03/11/2015  Harold [name] 1928-11-16 [id number]   Patient evaluated for [place] [facility] services. Came to bedside to speak with him about [facility] services. However, inpatient RNCM at bedside speaking with patient about discharge plans. Will engage for [facility] services if patient is going home. Will await to hear from inpatient RNCM about disposition plans.   Marthenia Rolling, MSN-Ed, RN,BSN [facility] Liaison [phone]

## 2015-03-12 LAB — CBC
HEMATOCRIT: 34 % — AB (ref 39.0–52.0)
HEMOGLOBIN: 10.7 g/dL — AB (ref 13.0–17.0)
MCH: 26 pg (ref 26.0–34.0)
MCHC: 31.5 g/dL (ref 30.0–36.0)
MCV: 82.5 fL (ref 78.0–100.0)
Platelets: 178 10*3/uL (ref 150–400)
RBC: 4.12 MIL/uL — AB (ref 4.22–5.81)
RDW: 16.3 % — ABNORMAL HIGH (ref 11.5–15.5)
WBC: 6.3 10*3/uL (ref 4.0–10.5)

## 2015-03-12 LAB — BASIC METABOLIC PANEL
ANION GAP: 7 (ref 5–15)
BUN: 38 mg/dL — ABNORMAL HIGH (ref 6–20)
CALCIUM: 8.7 mg/dL — AB (ref 8.9–10.3)
CHLORIDE: 103 mmol/L (ref 101–111)
CO2: 28 mmol/L (ref 22–32)
Creatinine, Ser: 1.92 mg/dL — ABNORMAL HIGH (ref 0.61–1.24)
GFR, EST AFRICAN AMERICAN: 36 mL/min — AB (ref >60)
GFR, EST NON AFRICAN AMERICAN: 31 mL/min — AB (ref >60)
GLUCOSE: 103 mg/dL — AB (ref 65–99)
POTASSIUM: 3.5 mmol/L (ref 3.5–5.1)
Sodium: 138 mmol/L (ref 135–145)

## 2015-03-12 MED ORDER — ALBUTEROL SULFATE (2.5 MG/3ML) 0.083% IN NEBU: 2.5000 mg | INHALATION_SOLUTION | RESPIRATORY_TRACT | Status: AC | PRN

## 2015-03-12 MED ORDER — CLOPIDOGREL BISULFATE 75 MG PO TABS: 75.0000 mg | ORAL_TABLET | Freq: Every day | ORAL | Status: AC

## 2015-03-12 MED ORDER — ISOSORBIDE MONONITRATE ER 30 MG PO TB24: 30.0000 mg | ORAL_TABLET | Freq: Every day | ORAL | Status: AC

## 2015-03-12 MED ORDER — LEVOFLOXACIN 500 MG PO TABS: 500.0000 mg | ORAL_TABLET | Freq: Every day | ORAL | Status: DC

## 2015-03-12 MED ORDER — PANTOPRAZOLE SODIUM 40 MG PO TBEC: 40.0000 mg | DELAYED_RELEASE_TABLET | Freq: Every day | ORAL | Status: AC

## 2015-03-12 NOTE — NC FL2 (Signed)
[place] LEVEL OF CARE SCREENING TOOL     IDENTIFICATION  Patient Name: Harold [name] Birthdate: 05-12-1928 Sex: male Admission Date (Current Location): 03/04/2015  [facility] and [place] Number: Herbalist and Address:  [facility],  [place] [address] [place]      Provider Number: [id number]  Attending Physician Name and Address:  Theodis Blaze, MD  Relative Name and Phone Number:       Current Level of Care: Hospital Recommended Level of Care: [place] Prior Approval Number:    Date Approved/Denied:   PASRR Number:    Discharge Plan: SNF    Current Diagnoses: Patient Active Problem List   Diagnosis Date Noted  . Acute respiratory failure with hypoxemia ([place])   . Acute on chronic respiratory failure ([place])   . Sepsis ([place]) 03/04/2015  . CAP (community acquired pneumonia) 03/04/2015  . Elevated troponin 03/04/2015  . Parkinson's disease ([place]) 09/28/2014  . Osteoarthritis 09/28/2014  . Depression 11/19/2013  . Risk for falls 11/19/2013  . CKD stage III. GFR 30-40 10/04/2013  . OSA on CPAP 10/04/2013  . Protein-calorie malnutrition, severe ([place]) 09/28/2013  . Hx of CABG   . DIASTOLIC HEART FAILURE, CHRONIC 09/29/2008  . MACULAR DEGENERATION 10/03/2007  . Hyperlipidemia 06/05/2007  . Gout 10/28/2006  . Essential hypertension 10/28/2006  . Abdominal aortic aneurysm ([place]) 10/28/2006  . Peripheral vascular disease ([place]) 10/28/2006    Orientation ACTIVITIES/SOCIAL BLADDER RESPIRATION    Self, Time, Situation, Place    Continent Normal  BEHAVIORAL SYMPTOMS/MOOD NEUROLOGICAL BOWEL NUTRITION STATUS      Continent Diet (low sodium)  PHYSICIAN VISITS COMMUNICATION OF NEEDS Height & Weight Skin    Verbally   165 lbs. Normal          AMBULATORY STATUS RESPIRATION      Normal      Personal Care Assistance Level of Assistance  Bathing, Dressing Bathing Assistance: Maximum assistance   Dressing  Assistance: Maximum assistance      Functional Limitations Info                SPECIAL CARE FACTORS FREQUENCY                      Additional Factors Info  Code Status Code Status Info: full code             Current Medications (03/12/2015): Current Facility-Administered Medications  Medication Dose Route Frequency Provider Last Rate Last Dose  . acetaminophen (TYLENOL) tablet 650 mg  650 mg Oral Q6H PRN [name], MD   650 mg at 03/08/15 2204  . albuterol (PROVENTIL) (2.5 MG/3ML) 0.083% nebulizer solution 2.5 mg  2.5 mg Nebulization Q2H PRN [name], MD      . antiseptic oral rinse (CPC / CETYLPYRIDINIUM CHLORIDE 0.05%) solution 7 mL  7 mL Mouth Rinse BID [name], MD   7 mL at 03/12/15 0903  . aspirin EC tablet 81 mg  81 mg Oral Daily Erlene Quan, PA-C   81 mg at 03/12/15 [id number]  . chlorhexidine (PERIDEX) 0.12 % solution 15 mL  15 mL Mouth Rinse BID [name], MD   15 mL at 03/12/15 0902  . clopidogrel (PLAVIX) tablet 75 mg  75 mg Per Tube Daily [name], MD   75 mg at 03/12/15 0902  . enoxaparin (LOVENOX) injection 40 mg  40 mg Subcutaneous Q24H Dara Hoyer, RPH      .  furosemide (LASIX) tablet 40 mg  40 mg Oral Daily Theodis Blaze, MD   40 mg at 03/12/15 0902  . hydrALAZINE (APRESOLINE) injection 10-40 mg  10-40 mg Intravenous Q4H PRN Juanito Doom, MD   20 mg at 03/09/15 0732  . levofloxacin (LEVAQUIN) IVPB 750 mg  750 mg Intravenous Q48H Christine E Shade, RPH   750 mg at 03/11/15 0843  . nitroGLYCERIN (NITROSTAT) SL tablet 0.4 mg  0.4 mg Sublingual Q5 min PRN Domenic Polite, MD      . ondansetron [facility]) injection 4 mg  4 mg Intravenous Q6H PRN Domenic Polite, MD      . pantoprazole sodium (PROTONIX) 40 mg/20 mL oral suspension 40 mg  40 mg Per Tube Daily [name], MD   40 mg at 03/12/15 0902  . simvastatin (ZOCOR) tablet 20 mg  20 mg Per Tube Daily [name], MD   20 mg at 03/11/15 1719  . sodium chloride 0.9 % bolus 1,000 mL  1,000 mL  Intravenous Once [name], MD   1,000 mL at 03/06/15 1315   Do not use this list as official medication orders. Please verify with discharge summary.  Discharge Medications:   Medication List    STOP taking these medications        amoxicillin 500 MG capsule  Commonly known as:  AMOXIL     buPROPion 150 MG 24 hr tablet  Commonly known as:  WELLBUTRIN XL     docusate sodium 100 MG capsule  Commonly known as:  COLACE     fluticasone 50 MCG/ACT nasal spray  Commonly known as:  FLONASE     FLUZONE HIGH-DOSE 0.5 ML [place]  Generic drug:  Influenza Vac Split High-Dose     naproxen sodium 220 MG tablet  Commonly known as:  ANAPROX     triamcinolone cream 0.1 %  Commonly known as:  KENALOG      TAKE these medications        albuterol (2.5 MG/3ML) 0.083% nebulizer solution  Commonly known as:  PROVENTIL  Take 3 mLs (2.5 mg total) by nebulization every 2 (two) hours as needed for wheezing or shortness of breath.     aspirin 81 MG tablet  Take 81 mg by mouth daily.     clopidogrel 75 MG tablet  Commonly known as:  PLAVIX  Take 1 tablet (75 mg total) by mouth daily.     furosemide 40 MG tablet  Commonly known as:  LASIX  Take 1 tablet (40 mg total) by mouth daily.     isosorbide mononitrate 30 MG 24 hr tablet  Commonly known as:  IMDUR  Take 1 tablet (30 mg total) by mouth daily.     levofloxacin 500 MG tablet  Commonly known as:  LEVAQUIN  Take 1 tablet (500 mg total) by mouth daily. Take for two more days     nitroGLYCERIN 0.4 MG SL tablet  Commonly known as:  NITROSTAT  Place 0.4 mg under the tongue every 5 (five) minutes as needed for chest pain.     pantoprazole 40 MG tablet  Commonly known as:  PROTONIX  Take 1 tablet (40 mg total) by mouth daily.     polyethylene glycol powder powder  Commonly known as:  GLYCOLAX/MIRALAX  Take 0.5 Containers by mouth daily as needed for mild constipation.     potassium chloride 10 MEQ tablet  Commonly known as:  K-DUR   Take 2 tablets (20 mEq total) by mouth daily.  simvastatin 20 MG tablet  Commonly known as:  ZOCOR  Take 1 tablet (20 mg total) by mouth daily.        Relevant Imaging Results:  Relevant Lab Results:  Recent Labs    Additional Information no known allergies  Carlean Jews, LCSW

## 2015-03-12 NOTE — Discharge Instructions (Signed)
Respiratory failure is when your lungs are not working well and your breathing (respiratory) system fails. When respiratory failure occurs, it is difficult for your lungs to get enough oxygen, get rid of carbon dioxide, or both. Respiratory failure can be life threatening.  °Respiratory failure can be acute or chronic. Acute respiratory failure is sudden, severe, and requires emergency medical treatment. Chronic respiratory failure is less severe, happens over time, and requires ongoing treatment.  °WHAT ARE THE CAUSES OF ACUTE RESPIRATORY FAILURE?  °Any problem affecting the heart or lungs can cause acute respiratory failure. Some of these causes include the following: °· Chronic bronchitis and emphysema (COPD).   °· Blood clot going to a lung (pulmonary embolism).   °· Having water in the lungs caused by heart failure, lung injury, or infection (pulmonary edema).   °· Collapsed lung (pneumothorax).   °· Pneumonia.   °· Pulmonary fibrosis.   °· Obesity.   °· Asthma.   °· Heart failure.   °· Any type of trauma to the chest that can make breathing difficult.   °· Nerve or muscle diseases making chest movements difficult. °HOW WILL MY ACUTE RESPIRATORY FAILURE BE TREATED?  °Treatment of acute respiratory failure depends on the cause of the respiratory failure. Usually, you will stay in the intensive care unit so your breathing can be watched closely. Treatment can include the following: °· Oxygen. Oxygen can be delivered through the following: °· Nasal cannula. This is small tubing that goes in your nose to give you oxygen. °· Face mask. A face mask covers your nose and mouth to give you oxygen. °· Medicine. Different medicines can be given to help with breathing. These can include: °· Nebulizers. Nebulizers deliver medicines to open the air passages (bronchodilators). These medicines help to open or relax the airways in the lungs so you can breathe better. They can also help loosen mucus from your  lungs. °· Diuretics. Diuretic medicines can help you breathe better by getting rid of extra water in your body. °· Steroids. Steroid medicines can help decrease swelling (inflammation) in your lungs. °· Antibiotics. °· Chest tube. If you have a collapsed lung (pneumothorax), a chest tube is placed to help reinflate the lung. °· Noninvasive positive pressure ventilation (NPPV). This is a tight-fitting mask that goes over your nose and mouth. The mask has tubing that is attached to a machine. The machine blows air into the tubing, which helps to keep the tiny air sacs (alveoli) in your lungs open. This machine allows you to breathe on your own. °· Ventilator. A ventilator is a breathing machine. When on a ventilator, a breathing tube is put into the lungs. A ventilator is used when you can no longer breathe well enough on your own. You may have low oxygen levels or high carbon dioxide (CO2) levels in your blood. When you are on a ventilator, sedation and pain medicines are given to make you sleep so your lungs can heal. °SEEK IMMEDIATE MEDICAL CARE IF: °· You have shortness of breath (dyspnea) with or without activity. °· You have rapid breathing (tachypnea). °· You are wheezing. °· You are unable to say more than a few words without having to catch your breath. °· You find it very difficult to function normally. °· You have a fast heart rate. °· You have a bluish color to your finger or toe nail beds. °· You have confusion or drowsiness or both. °  °This information is not intended to replace advice given to you by your health care provider. Make sure you discuss   any questions you have with your health care provider. °  °Document Released: 04/21/2013 Document Revised: 01/05/2015 Document Reviewed: 04/21/2013 °Elsevier Interactive Patient Education ©2016 [employer] °Community-Acquired Pneumonia, Adult °Pneumonia is an infection of the lungs. There are different types of pneumonia. One type can develop while a  person is in a hospital. A different type, called community-acquired pneumonia, develops in people who are not, or have not recently been, in the hospital or other health care facility.  °CAUSES °Pneumonia may be caused by bacteria, viruses, or funguses. Community-acquired pneumonia is often caused by Streptococcus pneumonia bacteria. These bacteria are often passed from one person to another by breathing in droplets from the cough or sneeze of an infected person. °RISK FACTORS °The condition is more likely to develop in: °· People who have chronic diseases, such as chronic obstructive pulmonary disease (COPD), asthma, congestive heart failure, cystic fibrosis, diabetes, or kidney disease. °· People who have early-stage or late-stage HIV. °· People who have sickle cell disease. °· People who have had their spleen removed (splenectomy). °· People who have poor dental hygiene. °· People who have medical conditions that increase the risk of breathing in (aspirating) secretions their own mouth and nose.   °· People who have a weakened immune system (immunocompromised). °· People who smoke. °· People who travel to areas where pneumonia-causing germs commonly exist. °· People who are around animal habitats or animals that have pneumonia-causing germs, including birds, bats, rabbits, cats, and farm animals. °SYMPTOMS °Symptoms of this condition include: °· A dry cough. °· A wet (productive) cough. °· Fever. °· Sweating. °· Chest pain, especially when breathing deeply or coughing. °· Rapid breathing or difficulty breathing. °· Shortness of breath. °· Shaking chills. °· Fatigue. °· Muscle aches. °DIAGNOSIS °Your health care provider will take a medical history and perform a physical exam. You may also have other tests, including: °· Imaging studies of your chest, including X-rays. °· Tests to check your blood oxygen level and other blood gases. °· Other tests on blood, mucus (sputum), fluid around your lungs (pleural fluid),  and urine. °If your pneumonia is severe, other tests may be done to identify the specific cause of your illness. °TREATMENT °The type of treatment that you receive depends on many factors, such as the cause of your pneumonia, the medicines you take, and other medical conditions that you have. For most adults, treatment and recovery from pneumonia may occur at home. In some cases, treatment must happen in a hospital. Treatment may include: °· Antibiotic medicines, if the pneumonia was caused by bacteria. °· Antiviral medicines, if the pneumonia was caused by a virus. °· Medicines that are given by mouth or through an IV tube. °· Oxygen. °· Respiratory therapy. °Although rare, treating severe pneumonia may include: °· Mechanical ventilation. This is done if you are not breathing well on your own and you cannot maintain a safe blood oxygen level. °· Thoracentesis. This procedure removes fluid around one lung or both lungs to help you breathe better. °HOME CARE INSTRUCTIONS °· Take over-the-counter and prescription medicines only as told by your health care provider. °¨ Only take cough medicine if you are losing sleep. Understand that cough medicine can prevent your body's natural ability to remove mucus from your lungs. °¨ If you were prescribed an antibiotic medicine, take it as told by your health care provider. Do not stop taking the antibiotic even if you start to feel better. °· Sleep in a semi-upright position at night. Try sleeping in a reclining chair, or place a few pillows under your head. °· Do   not use tobacco products, including cigarettes, chewing tobacco, and e-cigarettes. If you need help quitting, ask your health care provider. °· Drink enough water to keep your urine clear or pale yellow. This will help to thin out mucus secretions in your lungs. °PREVENTION °There are ways that you can decrease your risk of developing community-acquired pneumonia. Consider getting a pneumococcal vaccine if: °· You are  older than 79 years of age. °· You are older than 79 years of age and are undergoing cancer treatment, have chronic lung disease, or have other medical conditions that affect your immune system. Ask your health care provider if this applies to you. °There are different types and schedules of pneumococcal vaccines. Ask your health care provider which vaccination option is best for you. °You may also prevent community-acquired pneumonia if you take these actions: °· Get an influenza vaccine every year. Ask your health care provider which type of influenza vaccine is best for you. °· Go to the dentist on a regular basis. °· Wash your hands often. Use hand sanitizer if soap and water are not available. °SEEK MEDICAL CARE IF: °· You have a fever. °· You are losing sleep because you cannot control your cough with cough medicine. °SEEK IMMEDIATE MEDICAL CARE IF: °· You have worsening shortness of breath. °· You have increased chest pain. °· Your sickness becomes worse, especially if you are an older adult or have a weakened immune system. °· You cough up blood. °  °This information is not intended to replace advice given to you by your health care provider. Make sure you discuss any questions you have with your health care provider. °  °Document Released: 04/16/2005 Document Revised: 01/05/2015 Document Reviewed: 08/11/2014 °Elsevier Interactive Patient Education ©2016 Elsevier Inc. ° °

## 2015-03-12 NOTE — Progress Notes (Signed)
Patient ID: Harold [name], male   DOB: Dec 22, 1928, 81 y.o.   MRN: [id number]  [facility] PROGRESS NOTE  Harold [name] [id number] DOB: 12/07/1928 DOA: 03/04/2015 PCP: Garret Reddish, MD   Brief narrative:    81 yo male presented with progressive dyspnea and cough with yellow sputum 2nd to PNA. Developed respiratory failure and intubated 11/5 for hypercarbic resp failure  STUDIES:  11/05 Echo >> EF 50 to 55%, mod LVH, grade 2 diastolic dysfx, mod MR, PAS 42 mmHg  SIGNIFICANT EVENTS: 11/04 Admit, cardiology consult, heparin gtt 11/06 transfer to ICU 11/10 [place] assumed care from [facility]  Assessment/Plan:    Principal Problem:   Acute on chronic respiratory failure ([place]) with hypoxia and hypercarbia  - secondary to aspiration and PNA, RML and RLL - required intubation from 11/06 - 11/07 - continue PRN BD, ambulation encouraged  - provide pulmonary hygiene  - respiratory status stable this AM  Active Problems:   Sepsis secondary to PNA, RML and RLL, also enterococcus UTI - sepsis etiology resolved  - Zosyn 11/6>>>11/10 - Levaquin 11/10>>> 11/17  - Urine 11/04 >> 80 K enterococcus - Blood 11/04 >> neg - Sputum 11/06 >> oral flora    NSTEMI  - hx of CAD s/p CABG - all SVGs occluded on cath 2010 - no chest pain this AM - no need for cycling troponins    Acute on chronic diastolic CHF - continue with Lasix 40 mg but change to PO  - weight trending: 76 kg --> 75 kg this AM and stable over the past 24 hours  - monitor daily weight, strict I/O, renal function     Essential hypertension - stable this AM  - continue hydralazine as needed for now     CKD stage III. GFR 30-40 - Cr slightly up this AM from lasix - BMP In AM    Hypokalemia - continue to supplement if needed - BMP In AM    Parkinson's disease (HCC) - PT eval done, will need SNF, placement pending     Dysphagia - confirmed on MBS 11/9 - advance diet as pt requesting, respect wishes     Hx of Depression. - Hold bupropion  DVT prophylaxis - Lovenox SQ  Code Status: Full.  Family Communication:  plan of care discussed with the patient Disposition Plan: SNF when bed available   IV access:  Peripheral IV  Procedures and diagnostic studies:     Dg Chest Port 1 View 03/10/2015  Improved pulmonary edema since 2 days ago. 2. Right lung opacity may reflect the reported pneumonia or asymmetric edema.   Dg Chest Port 1 View 03/08/2015  Interval increase in conspicuity of bilateral interstitial and alveolar opacities consistent with pneumonia and/or pulmonary edema. Interval extubation of the trachea.   Dg Chest Port 1 View 03/07/2015  Stable positioning of endotracheal and orogastric tubes. 2. Improved pulmonary edema since yesterday. 3. Right basilar pneumonia.  Dg Chest Port 1 View 03/06/2015   Worsening aeration of both lungs compared with previous exam.  Medical Consultants:  None  Other Consultants:  PT SLP  IAnti-Infectives:   Levaquin 11/10 -->  Faye Ramsay, MD  [employer] Pager [phone]  If 7PM-7AM, please contact night-coverage [email or web address] Password [email or web address] 03/12/2015, 9:30 PM   LOS: 8 days   HPI/Subjective: No events overnight.   Objective: Filed Vitals:   03/11/15 2023 03/12/15 0639 03/12/15 1426 03/12/15 2118  BP: 156/71 124/63 144/53 135/79  Pulse: 96 69 70 93  Temp: 98 F (  36.7 C) 98.9 F (37.2 C) 98.3 F (36.8 C) 97.3 F (36.3 C)  TempSrc: Oral Oral Axillary Axillary  Resp: 24 20 18 20   Height:      Weight:  75 kg (165 lb 5.5 oz)    SpO2: 100% 97% 97% 99%    Intake/Output Summary (Last 24 hours) at 03/12/15 2130 Last data filed at 03/12/15 1300  Gross per 24 hour  Intake    720 ml  Output    600 ml  Net    120 ml    Exam:   General:  Pt is alert, follows commands appropriately, not in acute distress  Cardiovascular: Regular rate and rhythm, no rubs, no gallops  Respiratory: Clear to auscultation bilaterally, no  wheezing, no crackles, no rhonchi  Abdomen: Soft, non tender, non distended, bowel sounds present, no guarding  Data Reviewed: Basic Metabolic Panel:  Recent Labs Lab 03/07/15 0330 03/08/15 0345 03/09/15 0350 03/10/15 0436 03/11/15 0540 03/12/15 0523  NA 139 143 141 140 140 138  K 3.7 3.4* 3.0* 3.9 3.2* 3.5  CL 106 107 105 105 106 103  CO2 24 26 27 26 26 28   GLUCOSE 85 81 103* 108* 93 103*  BUN 43* 44* 39* 35* 35* 38*  CREATININE 2.19* 2.43* 2.19* 2.02* 1.76* 1.92*  CALCIUM 8.3* 8.4* 8.8* 8.7* 8.8* 8.7*  MG 1.9  --  2.5*  --   --   --   PHOS 3.2  --  2.9 2.6  --   --    Liver Function Tests:  Recent Labs Lab 03/07/15 0330  AST 18  ALT 9*  ALKPHOS 45  BILITOT 1.1  PROT 5.2*  ALBUMIN 2.3*   CBC:  Recent Labs Lab 03/08/15 0345 03/09/15 0350 03/10/15 0436 03/11/15 0540 03/12/15 0523  WBC 8.5 8.1 9.9 6.3 6.3  HGB 10.2* 11.3* 11.1* 10.8* 10.7*  HCT 31.5* 35.9* 34.4* 33.3* 34.0*  MCV 84.7 84.9 83.5 83.7 82.5  PLT 166 179 180 171 178   Cardiac Enzymes:  Recent Labs Lab 03/05/15 2202 03/06/15 0320  TROPONINI 4.75* 4.29*   Recent Results (from the past 240 hour(s))  Culture, blood (routine x 2)     Status: None   Collection Time: 03/04/15  1:55 PM  Result Value Ref Range Status   Specimen Description BLOOD LEFT ANTECUBITAL  Final   Special Requests IN PEDIATRIC BOTTLE 5CC  Final   Culture   Final    NO GROWTH 5 DAYS Performed at [facility]    Report Status 03/09/2015 FINAL  Final  Culture, blood (routine x 2)     Status: None   Collection Time: 03/04/15  2:51 PM  Result Value Ref Range Status   Specimen Description BLOOD RIGHT ANTECUBITAL  Final   Special Requests BOTTLES DRAWN AEROBIC AND ANAEROBIC 5CC EACH  Final   Culture   Final    NO GROWTH 5 DAYS Performed at [facility]    Report Status 03/09/2015 FINAL  Final  Urine culture     Status: None   Collection Time: 03/04/15  6:15 PM  Result Value Ref Range Status   Specimen  Description URINE, RANDOM  Final   Special Requests NONE  Final   Culture   Final    80,000 COLONIES/ml ENTEROCOCCUS SPECIES Performed at [facility]    Report Status 03/07/2015 FINAL  Final   Organism ID, Bacteria ENTEROCOCCUS SPECIES  Final      Susceptibility   Enterococcus species -  MIC*    AMPICILLIN <=2 SENSITIVE Sensitive     LEVOFLOXACIN 0.5 SENSITIVE Sensitive     NITROFURANTOIN <=16 SENSITIVE Sensitive     VANCOMYCIN 2 SENSITIVE Sensitive     * 80,000 COLONIES/ml ENTEROCOCCUS SPECIES  Culture, respiratory (NON-Expectorated)     Status: None   Collection Time: 03/06/15 12:56 PM  Result Value Ref Range Status   Specimen Description TRACHEAL ASPIRATE  Final   Special Requests NONE  Final   Gram Stain   Final    MODERATE WBC PRESENT,BOTH PMN AND MONONUCLEAR NO SQUAMOUS EPITHELIAL CELLS SEEN NO ORGANISMS SEEN Performed at [facility]    Culture   Final    Non-Pathogenic Oropharyngeal-type Flora Isolated. Performed at [facility]    Report Status 03/08/2015 FINAL  Final  MRSA PCR Screening     Status: None   Collection Time: 03/06/15  1:00 PM  Result Value Ref Range Status   MRSA by PCR NEGATIVE NEGATIVE Final    Comment:        The GeneXpert MRSA Assay (FDA approved for NASAL specimens only), is one component of a comprehensive MRSA colonization surveillance program. It is not intended to diagnose MRSA infection nor to guide or monitor treatment for MRSA infections.      Scheduled Meds: . antiseptic oral rinse  7 mL Mouth Rinse BID  . aspirin EC  81 mg Oral Daily  . chlorhexidine  15 mL Mouth Rinse BID  . clopidogrel  75 mg Per Tube Daily  . enoxaparin (LOVENOX) injection  40 mg Subcutaneous Q24H  . furosemide  40 mg Oral Daily  . levofloxacin (LEVAQUIN) IV  750 mg Intravenous Q48H  . pantoprazole sodium  40 mg Per Tube Daily  . simvastatin  20 mg Per Tube Daily  . sodium chloride  1,000 mL Intravenous Once   Continuous  Infusions:

## 2015-03-12 NOTE — Clinical Social Work Note (Signed)
CSW reviewed week day handoff that reflected pt has agreed to [facility] after saying no for days. CSW called and spoke with pt's HCPOA to obtain information and confirm.  Jerolyn Center [id number] Provided information and stated that she was in agreement with SNF.  CSW accessed [facility] and requested [facility] number and it went to level II.  CSW sent pt information to SNF's in [place].  CSW spoke with MD and let her know about waiting for [facility].  CSW spoke with RN.  CSW will continue to meet with pt until discharge  .Dede Query, LCSW [facility] Clinical Social Worker - Weekend Coverage cell #: [phone]

## 2015-03-13 LAB — BASIC METABOLIC PANEL
ANION GAP: 7 (ref 5–15)
BUN: 38 mg/dL — ABNORMAL HIGH (ref 6–20)
CHLORIDE: 104 mmol/L (ref 101–111)
CO2: 27 mmol/L (ref 22–32)
Calcium: 8.4 mg/dL — ABNORMAL LOW (ref 8.9–10.3)
Creatinine, Ser: 2.07 mg/dL — ABNORMAL HIGH (ref 0.61–1.24)
GFR calc Af Amer: 32 mL/min — ABNORMAL LOW (ref >60)
GFR, EST NON AFRICAN AMERICAN: 28 mL/min — AB (ref >60)
GLUCOSE: 98 mg/dL (ref 65–99)
POTASSIUM: 3.6 mmol/L (ref 3.5–5.1)
Sodium: 138 mmol/L (ref 135–145)

## 2015-03-13 LAB — CBC
HEMATOCRIT: 33.3 % — AB (ref 39.0–52.0)
HEMOGLOBIN: 10.6 g/dL — AB (ref 13.0–17.0)
MCH: 26.7 pg (ref 26.0–34.0)
MCHC: 31.8 g/dL (ref 30.0–36.0)
MCV: 83.9 fL (ref 78.0–100.0)
PLATELETS: 180 10*3/uL (ref 150–400)
RBC: 3.97 MIL/uL — AB (ref 4.22–5.81)
RDW: 16.3 % — ABNORMAL HIGH (ref 11.5–15.5)
WBC: 5.5 10*3/uL (ref 4.0–10.5)

## 2015-03-13 MED ORDER — FUROSEMIDE 20 MG PO TABS
20.0000 mg | ORAL_TABLET | Freq: Every day | ORAL | Status: DC
  Administered 2015-03-14: 20 mg via ORAL
  Filled 2015-03-13: qty 1

## 2015-03-13 MED ORDER — ENOXAPARIN SODIUM 30 MG/0.3ML ~~LOC~~ SOLN
30.0000 mg | SUBCUTANEOUS | Status: DC
  Administered 2015-03-14: 30 mg via SUBCUTANEOUS
  Filled 2015-03-13: qty 0.3

## 2015-03-13 NOTE — Progress Notes (Signed)
Patient ID: Harold [name], male   DOB: [birth year], 81 y.o.   MRN: [id number]  [facility] PROGRESS NOTE  Harold [name] [id number] DOB: [birth year] DOA: 03/04/2015 PCP: Garret Reddish, MD   Brief narrative:    81 yo male presented with progressive dyspnea and cough with yellow sputum 2nd to PNA. Developed respiratory failure and intubated 11/5 for hypercarbic resp failure  STUDIES:  11/05 Echo >> EF 50 to 55%, mod LVH, grade 2 diastolic dysfx, mod MR, PAS 42 mmHg  SIGNIFICANT EVENTS: 11/04 Admit, cardiology consult, heparin gtt 11/06 transfer to ICU 11/10 [place] assumed care from PCCM  Assessment/Plan:    Principal Problem:   Acute on chronic respiratory failure ([place]) with hypoxia and hypercarbia  - secondary to aspiration and PNA, RML and RLL - required intubation from 11/06 - 11/07 - continue PRN BD, ambulation encouraged  - continue to provide pulmonary hygiene  - respiratory status stable this AM  Active Problems:   Sepsis secondary to PNA, RML and RLL, also enterococcus UTI - sepsis etiology resolved  - Zosyn 11/6>>>11/10 - Levaquin 11/10>>> 11/17  - Urine 11/04 >> 80 K enterococcus - Blood 11/04 >> neg - Sputum 11/06 >> oral flora    NSTEMI  - hx of CAD s/p CABG - all SVGs occluded on cath 2010 - no chest pain over 48 hours, d/c telemetry  - no need for cycling troponins    Acute on chronic diastolic CHF - continue with Lasix but lower the dose as Cr is up and weight is trending down  - place on 20 mg PO QD  - weight trending: 76 kg --> 75 kg --> 73 kg this AM  - monitor daily weight, strict I/O, renal function     Essential hypertension - stable this AM  - continue hydralazine as needed for now     CKD stage III. GFR 30-40 - Cr slightly up this AM from lasix - BMP In AM    Hypokalemia - continue to supplement if needed - BMP In AM    Parkinson's disease ([place]) - PT eval done, will need SNF, placement pending     Dysphagia -  confirmed on MBS 11/9 - advanced diet as pt requesting, soft diet tolerated     Hx of Depression. - Holding bupropion - normal affect   DVT prophylaxis - Lovenox SQ  Code Status: Full.  Family Communication:  plan of care discussed with the patient Disposition Plan: SNF when bed available   IV access:  Peripheral IV  Procedures and diagnostic studies:     Dg Chest Port 1 View 03/10/2015  Improved pulmonary edema since 2 days ago. 2. Right lung opacity may reflect the reported pneumonia or asymmetric edema.   Dg Chest Port 1 View 03/08/2015  Interval increase in conspicuity of bilateral interstitial and alveolar opacities consistent with pneumonia and/or pulmonary edema. Interval extubation of the trachea.   Dg Chest Port 1 View 03/07/2015  Stable positioning of endotracheal and orogastric tubes. 2. Improved pulmonary edema since yesterday. 3. Right basilar pneumonia.  Dg Chest Port 1 View 03/06/2015   Worsening aeration of both lungs compared with previous exam.  Medical Consultants:  None  Other Consultants:  PT SLP  IAnti-Infectives:   Levaquin 11/10 -->  Harold Ramsay, MD  [place] Pager [phone]  If 7PM-7AM, please contact night-coverage [email or web address] Password [email or web address] 03/13/2015, 11:24 AM   LOS: 9 days   HPI/Subjective: No events overnight.   Objective: Filed Vitals:   03/12/15  1426 03/12/15 2118 03/12/15 2156 03/13/15 0629  BP: 144/53 135/79 130/49 151/66  Pulse: 70 93 60 64  Temp: 98.3 F (36.8 C) 97.3 F (36.3 C) 98.6 F (37 C) 98.2 F (36.8 C)  TempSrc: Axillary Axillary Oral Oral  Resp: 18 20 20 20   Height:      Weight:    73.2 kg (161 lb 6 oz)  SpO2: 97% 99% 96% 95%    Intake/Output Summary (Last 24 hours) at 03/13/15 1124 Last data filed at 03/13/15 0900  Gross per 24 hour  Intake    480 ml  Output   1350 ml  Net   -870 ml    Exam:   General:  Pt is alert, follows commands appropriately, not in acute distress  Cardiovascular:  Regular rate and rhythm, no rubs, no gallops  Respiratory: Clear to auscultation bilaterally, no wheezing, no crackles, no rhonchi  Abdomen: Soft, non tender, non distended, bowel sounds present, no guarding  Data Reviewed: Basic Metabolic Panel:  Recent Labs Lab 03/07/15 0330  03/09/15 0350 03/10/15 0436 03/11/15 0540 03/12/15 0523 03/13/15 0506  NA 139  < > 141 140 140 138 138  K 3.7  < > 3.0* 3.9 3.2* 3.5 3.6  CL 106  < > 105 105 106 103 104  CO2 24  < > 27 26 26 28 27   GLUCOSE 85  < > 103* 108* 93 103* 98  BUN 43*  < > 39* 35* 35* 38* 38*  CREATININE 2.19*  < > 2.19* 2.02* 1.76* 1.92* 2.07*  CALCIUM 8.3*  < > 8.8* 8.7* 8.8* 8.7* 8.4*  MG 1.9  --  2.5*  --   --   --   --   PHOS 3.2  --  2.9 2.6  --   --   --   < > = values in this interval not displayed. Liver Function Tests:  Recent Labs Lab 03/07/15 0330  AST 18  ALT 9*  ALKPHOS 45  BILITOT 1.1  PROT 5.2*  ALBUMIN 2.3*   CBC:  Recent Labs Lab 03/09/15 0350 03/10/15 0436 03/11/15 0540 03/12/15 0523 03/13/15 0506  WBC 8.1 9.9 6.3 6.3 5.5  HGB 11.3* 11.1* 10.8* 10.7* 10.6*  HCT 35.9* 34.4* 33.3* 34.0* 33.3*  MCV 84.9 83.5 83.7 82.5 83.9  PLT 179 180 171 178 180   Cardiac Enzymes: No results for input(s): CKTOTAL, CKMB, CKMBINDEX, TROPONINI in the last 168 hours. Recent Results (from the past 240 hour(s))  Culture, blood (routine x 2)     Status: None   Collection Time: 03/04/15  1:55 PM  Result Value Ref Range Status   Specimen Description BLOOD LEFT ANTECUBITAL  Final   Special Requests IN PEDIATRIC BOTTLE 5CC  Final   Culture   Final    NO GROWTH 5 DAYS Performed at [facility]    Report Status 03/09/2015 FINAL  Final  Culture, blood (routine x 2)     Status: None   Collection Time: 03/04/15  2:51 PM  Result Value Ref Range Status   Specimen Description BLOOD RIGHT ANTECUBITAL  Final   Special Requests BOTTLES DRAWN AEROBIC AND ANAEROBIC [facility] EACH  Final   Culture   Final    NO  GROWTH 5 DAYS Performed at [facility]    Report Status 03/09/2015 FINAL  Final  Urine culture     Status: None   Collection Time: 03/04/15  6:15 PM  Result Value Ref Range Status   Specimen  Description URINE, RANDOM  Final   Special Requests NONE  Final   Culture   Final    80,000 COLONIES/ml ENTEROCOCCUS SPECIES Performed at [facility]    Report Status 03/07/2015 FINAL  Final   Organism ID, Bacteria ENTEROCOCCUS SPECIES  Final      Susceptibility   Enterococcus species - MIC*    AMPICILLIN <=2 SENSITIVE Sensitive     LEVOFLOXACIN 0.5 SENSITIVE Sensitive     NITROFURANTOIN <=16 SENSITIVE Sensitive     VANCOMYCIN 2 SENSITIVE Sensitive     * 80,000 COLONIES/ml ENTEROCOCCUS SPECIES  Culture, respiratory (NON-Expectorated)     Status: None   Collection Time: 03/06/15 12:56 PM  Result Value Ref Range Status   Specimen Description TRACHEAL ASPIRATE  Final   Special Requests NONE  Final   Gram Stain   Final    MODERATE WBC PRESENT,BOTH PMN AND MONONUCLEAR NO SQUAMOUS EPITHELIAL CELLS SEEN NO ORGANISMS SEEN Performed at [facility]    Culture   Final    Non-Pathogenic Oropharyngeal-type Flora Isolated. Performed at [facility]    Report Status 03/08/2015 FINAL  Final  MRSA PCR Screening     Status: None   Collection Time: 03/06/15  1:00 PM  Result Value Ref Range Status   MRSA by PCR NEGATIVE NEGATIVE Final    Comment:        The GeneXpert MRSA Assay (FDA approved for NASAL specimens only), is one component of a comprehensive MRSA colonization surveillance program. It is not intended to diagnose MRSA infection nor to guide or monitor treatment for MRSA infections.      Scheduled Meds: . antiseptic oral rinse  7 mL Mouth Rinse BID  . aspirin EC  81 mg Oral Daily  . chlorhexidine  15 mL Mouth Rinse BID  . clopidogrel  75 mg Per Tube Daily  . enoxaparin (LOVENOX) injection  40 mg Subcutaneous Q24H  . furosemide  40 mg Oral Daily   . levofloxacin (LEVAQUIN) IV  750 mg Intravenous Q48H  . pantoprazole sodium  40 mg Per Tube Daily  . simvastatin  20 mg Per Tube Daily  . sodium chloride  1,000 mL Intravenous Once   Continuous Infusions:

## 2015-03-13 NOTE — Progress Notes (Signed)
ANTIBIOTIC CONSULT NOTE - Follow up   Pharmacy Consult for Levaquin Indication: PNA & Enterococcus UTI  No Known Allergies  Patient Measurements: Height: 5\' 11"  (180.3 cm) Weight: 161 lb 6 oz (73.2 kg) IBW/kg (Calculated) : 75.3  Vital Signs: Temp: 98.2 F (36.8 C) (11/13 0629) Temp Source: Oral (11/13 0629) BP: 151/66 mmHg (11/13 0629) Pulse Rate: 64 (11/13 0629) Intake/Output from previous day: 11/12 0701 - 11/13 0700 In: 720 [P.O.:720] Out: 1750 [Urine:1750]  Labs:  Recent Labs  03/11/15 0540 03/12/15 0523 03/13/15 0506  WBC 6.3 6.3 5.5  HGB 10.8* 10.7* 10.6*  PLT 171 178 180  CREATININE 1.76* 1.92* 2.07*   Estimated Creatinine Clearance: 28 mL/min (by C-G formula based on Cr of 2.07). No results for input(s): VANCOTROUGH, VANCOPEAK, VANCORANDOM, GENTTROUGH, GENTPEAK, GENTRANDOM, TOBRATROUGH, TOBRAPEAK, TOBRARND, AMIKACINPEAK, AMIKACINTROU, AMIKACIN in the last 72 hours.   Microbiology: Recent Results (from the past 720 hour(s))  Culture, blood (routine x 2)     Status: None   Collection Time: 03/04/15  1:55 PM  Result Value Ref Range Status   Specimen Description BLOOD LEFT ANTECUBITAL  Final   Special Requests IN PEDIATRIC BOTTLE 5CC  Final   Culture   Final    NO GROWTH 5 DAYS Performed at [facility]    Report Status 03/09/2015 FINAL  Final  Culture, blood (routine x 2)     Status: None   Collection Time: 03/04/15  2:51 PM  Result Value Ref Range Status   Specimen Description BLOOD RIGHT ANTECUBITAL  Final   Special Requests BOTTLES DRAWN AEROBIC AND ANAEROBIC 5CC EACH  Final   Culture   Final    NO GROWTH 5 DAYS Performed at [facility]    Report Status 03/09/2015 FINAL  Final  Urine culture     Status: None   Collection Time: 03/04/15  6:15 PM  Result Value Ref Range Status   Specimen Description URINE, RANDOM  Final   Special Requests NONE  Final   Culture   Final    80,000 COLONIES/ml ENTEROCOCCUS SPECIES Performed at [facility]    Report Status 03/07/2015 FINAL  Final   Organism ID, Bacteria ENTEROCOCCUS SPECIES  Final      Susceptibility   Enterococcus species - MIC*    AMPICILLIN <=2 SENSITIVE Sensitive     LEVOFLOXACIN 0.5 SENSITIVE Sensitive     NITROFURANTOIN <=16 SENSITIVE Sensitive     VANCOMYCIN 2 SENSITIVE Sensitive     * 80,000 COLONIES/ml ENTEROCOCCUS SPECIES  Culture, respiratory (NON-Expectorated)     Status: None   Collection Time: 03/06/15 12:56 PM  Result Value Ref Range Status   Specimen Description TRACHEAL ASPIRATE  Final   Special Requests NONE  Final   Gram Stain   Final    MODERATE WBC PRESENT,BOTH PMN AND MONONUCLEAR NO SQUAMOUS EPITHELIAL CELLS SEEN NO ORGANISMS SEEN Performed at [facility]    Culture   Final    Non-Pathogenic Oropharyngeal-type Flora Isolated. Performed at [facility]    Report Status 03/08/2015 FINAL  Final  MRSA PCR Screening     Status: None   Collection Time: 03/06/15  1:00 PM  Result Value Ref Range Status   MRSA by PCR NEGATIVE NEGATIVE Final    Comment:        The GeneXpert MRSA Assay (FDA approved for NASAL specimens only), is one component of a comprehensive MRSA colonization surveillance program. It is not intended to diagnose MRSA infection nor to  guide or monitor treatment for MRSA infections.    Assessment: Harold [name] presenting 11/4 to ED with shortness of breath.  Known to pharmacy for dosing heparin gtt for NSTEMI. Decompensated 11/6 requiring intubation and transfer to ICU.  Initially started on ceftriaxone and azithromycin, Pharmacy was consulted to dose vancomycin and zosyn 11/5 .  Antibiotics are now narrowed to Levaquin, pharmacy to dose.  11/5 >> ceftriaxone  >> 11/6 11/5 >> azithromcyin  >> 11/6 11/6 >> vancomycin  >> 11/8 11/6 >> pip/tazo  >>  11/9 11/9 >> Levaquin >>  Today, 03/13/2015: D9 total abx for aspiration PNA, D5 Levaquin Tmax: afebrile WBC decr to wnl SCr variable, 2.07, CrCl ~ 27  ml/min   Goal of Therapy:  Appropriate abx dosing, eradication of infection.  Plan:    No change Levaquin 750mg  IV q48h   Follow up renal fxn, clinical course.  Minda Ditto PharmD Pager [phone] 03/13/2015, 12:30 PM

## 2015-03-14 LAB — BASIC METABOLIC PANEL
ANION GAP: 7 (ref 5–15)
BUN: 39 mg/dL — ABNORMAL HIGH (ref 6–20)
CALCIUM: 8.5 mg/dL — AB (ref 8.9–10.3)
CO2: 27 mmol/L (ref 22–32)
CREATININE: 2 mg/dL — AB (ref 0.61–1.24)
Chloride: 102 mmol/L (ref 101–111)
GFR calc Af Amer: 34 mL/min — ABNORMAL LOW (ref >60)
GFR calc non Af Amer: 29 mL/min — ABNORMAL LOW (ref >60)
Glucose, Bld: 101 mg/dL — ABNORMAL HIGH (ref 65–99)
POTASSIUM: 3.7 mmol/L (ref 3.5–5.1)
Sodium: 136 mmol/L (ref 135–145)

## 2015-03-14 LAB — CBC
HEMATOCRIT: 33.4 % — AB (ref 39.0–52.0)
HEMOGLOBIN: 10.7 g/dL — AB (ref 13.0–17.0)
MCH: 26.5 pg (ref 26.0–34.0)
MCHC: 32 g/dL (ref 30.0–36.0)
MCV: 82.7 fL (ref 78.0–100.0)
Platelets: 195 10*3/uL (ref 150–400)
RBC: 4.04 MIL/uL — AB (ref 4.22–5.81)
RDW: 16 % — ABNORMAL HIGH (ref 11.5–15.5)
WBC: 7.7 10*3/uL (ref 4.0–10.5)

## 2015-03-14 MED ORDER — LEVOFLOXACIN 500 MG PO TABS: 500.0000 mg | ORAL_TABLET | Freq: Every day | ORAL | Status: DC

## 2015-03-14 MED ORDER — LEVOFLOXACIN 500 MG PO TABS: 500.0000 mg | ORAL_TABLET | Freq: Every day | ORAL | Status: AC

## 2015-03-14 NOTE — Care Management Important Message (Signed)
Important Message  Patient Details  Name: BRYSIN LOBRUTTO MRN: [id number] Date of Birth: 12-19-1928   Medicare Important Message Given:  Yes    Camillo Flaming 03/14/2015, 11:39 AMImportant Message  Patient Details  Name: XUE OVERTON MRN: [id number] Date of Birth: [birth year]   Medicare Important Message Given:  Yes    Camillo Flaming 03/14/2015, 11:39 AM

## 2015-03-14 NOTE — Clinical Social Work Placement (Signed)
   CLINICAL SOCIAL WORK PLACEMENT  NOTE  Date:  03/14/2015  Patient Details  Name: Harold [name] MRN: [id number] Date of Birth: [birth year]  Clinical Social Work is seeking post-discharge placement for this patient at the [place] level of care (*CSW will initial, date and re-position this form in  chart as items are completed):  Yes   Patient/family provided with [place] Work Department's list of facilities offering this level of care within the geographic area requested by the patient (or if unable, by the patient's family).  Yes   Patient/family informed of their freedom to choose among providers that offer the needed level of care, that participate in Medicare, Medicaid or managed care program needed by the patient, have an available bed and are willing to accept the patient.  Yes   Patient/family informed of [facility]'s ownership interest in [facility] and [facility], as well as of the fact that they are under no obligation to receive care at these facilities.  PASRR submitted to EDS on 03/12/15     PASRR number received on 03/14/15     Existing PASRR number confirmed on       FL2 transmitted to all facilities in geographic area requested by pt/family on 03/12/15     FL2 transmitted to all facilities within larger geographic area on       Patient informed that his/her managed care company has contracts with or will negotiate with certain facilities, including the following:        Yes   Patient/family informed of bed offers received.  Patient chooses bed at [facility]     Physician recommends and patient chooses bed at      Patient to be transferred to [facility] on 03/14/15.  Patient to be transferred to facility by [facility]     Patient family notified on 03/14/15 of transfer.  Name of family member notified:  SON     PHYSICIAN       Additional Comment: Pt / family are in agreement with d/c to [facility] today.  [facility] transport required. Pt / family are aware out of pocket costs may be associated with [facility] transport. D/C summary sent to SNF prior to d/c for review. Scripts included in d/c packet.    _______________________________________________ Luretha Rued, LCSW 03/14/2015, 2:57 PM

## 2015-03-14 NOTE — Progress Notes (Signed)
Speech Language Pathology Treatment: Dysphagia  Patient Details Name: Harold [name] MRN: [id number] DOB: 1929-01-04 Today's Date: 03/14/2015 Time: [id number] SLP Time Calculation (min) (ACUTE ONLY): 16 min  Assessment / Plan / Recommendation Clinical Impression  Per RN, pt not following compensation strategies for swallowing safety - requesting to use straws and declining to take medicine with anything but liquids.  Pt admits to being "able to see signs posted" but not following them.  Using teach back, pt and SLP reviewed reasoning for strategies.    Observed pt consuming water via straw - immediate throat clearing and delayed cough after swallowing - suspicious for laryngeal penetration and/or aspiration.  Pt again reports this to be "normal".    SLP reviewed importance for pt to strengthen voice/cough as much as possible to decrease risk of aspiration pna - especially given his lack of following strategies.  Incentive spirometer placed in pt's lap by this SLP to maximize respiratory musculature.    Suspect pt is chronically aspirating and at this time is managing but given decreased mobility and Parkinson's, concern for ongoing and recurrent asp pneumonias present.  Using teach back, pt was educated.    Highly recommend follow up SLP at [facility] for dysphagia management. Marland Kitchen     HPI HPI: Harold [name] is a 81 y.o. male the past medical history of CAD, CABG, chronic diastolic CHF, heavy former smoker, CKD 3, Parkinson's disease, memory loss, macular degeneration presents with shortness of breath. Patient reports productive cough with thick yellow phlegm and progressive shortness of breath for the last 2 weeks. He is a poor historian, lives in alone and has a caregiver during the daytime. In the emergency room he was found to be hypoxic, tachycardic with a temperature of 102.4, chest x-ray with extensive right middle and lower lobe pneumonia concerning of aspiration etiology. ST to evaluate  current swallow function - pt underwent BSE 11/4 and was started on regular/thin diet.  On 11/6 pt with respiratory failure - requiring ventilator support.  Pt was extubated yesterday 11/7.  Repeat swallow eval ordered.  Given h/o dysphagia and complex medical course, rec MBS.   Pt underwent MBS in 07/2014 as an OP.        SLP Plan  Continue with current plan of care     Recommendations  Diet recommendations: Thin liquid;Dysphagia 3 (mechanical soft) Liquids provided via: Cup;Straw Medication Administration: Whole meds with liquid Supervision: Patient able to self feed Compensations: Multiple dry swallows after each bite/sip;Follow solids with liquid;Hard cough after swallow Postural Changes and/or Swallow Maneuvers: Seated upright 90 degrees;Upright 30-60 min after meal              Oral Care Recommendations: Oral care BID Follow up Recommendations: Skilled Nursing facility Plan: Continue with current plan of care  Luanna Salk, [facility] SLP [phone]

## 2015-03-14 NOTE — Progress Notes (Signed)
Report given to [place], [place]. Pt will be transported via EMS

## 2015-03-14 NOTE — Care Management Note (Signed)
Case Management Note  Patient Details  Name: Harold [name] MRN: [id number] Date of Birth: Mar 04, 1929  Subjective/Objective:                    Action/Plan:d/c SNF.   Expected Discharge Date:                  Expected Discharge Plan:  Skilled Nursing Facility  In-House Referral:  Clinical Social Work  Discharge planning Services  CM Consult  Post Acute Care Choice:    Choice offered to:  Patient  DME Arranged:    DME Agency:     HH Arranged:    [place] Agency:     Status of Service:  Completed, signed off  Medicare Important Message Given:  Yes Date Medicare IM Given:    Medicare IM give by:    Date Additional Medicare IM Given:    Additional Medicare Important Message give by:     If discussed at [place] of Stay Meetings, dates discussed:    Additional Comments:  Dessa Phi, RN 03/14/2015, 11:28 AM

## 2015-03-14 NOTE — Discharge Summary (Addendum)
Physician Discharge Summary  Harold [name] [id number] DOB: Sep 14, 1928 DOA: 03/04/2015  PCP: Garret Reddish, MD  Admit date: 03/04/2015 Discharge date: 03/14/2015  Recommendations for Outpatient Follow-up:  1. Pt will need to follow up with PCP in 2-3 weeks post discharge 2. Please obtain BMP to evaluate electrolytes and kidney function 3. Please also check CBC to evaluate Hg and Hct levels 4. Take Levaquin for three more days post discharge 5. Weight on discharge 160 lbs, adjust the dose of Lasix as clinically indicated  6. Wellbutrin was stopped due to lethargy and pt has refused it  Discharge Diagnoses:  Principal Problem:   Acute on chronic respiratory failure (HCC) Active Problems:   Hyperlipidemia   Essential hypertension   DIASTOLIC HEART FAILURE, CHRONIC   Hx of CABG   CKD stage III. GFR 30-40   Parkinson's disease ([place])   Sepsis ([place])   CAP (community acquired pneumonia)   Elevated troponin   Acute respiratory failure with hypoxemia ([place])  Discharge Condition: Stable  Diet recommendation: Soft diet and advance as pt able to tolerate, aspiration precautions and supervision    Brief narrative:    81 yo male presented with progressive dyspnea and cough with yellow sputum 2nd to PNA. Developed respiratory failure and intubated 11/5 for hypercarbic resp failure  STUDIES:  11/05 Echo >> EF 50 to 55%, mod LVH, grade 2 diastolic dysfx, mod MR, PAS 42 mmHg  SIGNIFICANT EVENTS: 11/04 Admit, cardiology consult, heparin gtt 11/06 transfer to ICU 03-14-23 [facility] assumed care from PCCM  Assessment/Plan:    Principal Problem:  Acute on chronic respiratory failure ([place]) with hypoxia and hypercarbia  - secondary to aspiration and PNA, RML and RLL - required intubation from 11/06 - 11/07 - continue PRN BD - respiratory status stable this AM  Active Problems:  Sepsis secondary to PNA, RML and RLL, also enterococcus UTI - sepsis etiology resolved  - Zosyn  11/6>>>11/10 - Levaquin 2023/03/14>>> 11/17  - Urine 11/04 >> 80 K enterococcus - Blood 11/04 >> neg - Sputum 11/06 >> oral flora   NSTEMI  - hx of CAD s/p CABG - all SVGs occluded on cath 2010 - no chest pain over 48 hours   Acute on chronic diastolic CHF - continue with Lasix but lower the dose as Cr is up and weight is trending down  - placed on 20 mg PO QD  - weight trending: 76 kg --> 75 kg --> 72 kg this AM  - monitor daily weight   Essential hypertension - stable this AM    CKD stage III. GFR 30-40 - Cr slightly up this AM from lasix   Hypokalemia - continue to supplement if needed   Parkinson's disease ([place]) - PT eval done, will need SNF, placement in progress    Dysphagia - confirmed on MBS 11/9 - advanced diet as pt requesting, soft diet tolerated  - advance as pt able to tolerate    Hx of Depression. - Holding bupropion - normal affect   Code Status: Full.  Family Communication: plan of care discussed with the patient Disposition Plan: SNF   IV access:  Peripheral IV  Procedures and diagnostic studies:    Dg Chest Port 1 View 2015-03-14 Improved pulmonary edema since 2 days ago. 2. Right lung opacity may reflect the reported pneumonia or asymmetric edema.   Dg Chest Port 1 View 03/08/2015 Interval increase in conspicuity of bilateral interstitial and alveolar opacities consistent with pneumonia and/or pulmonary edema. Interval extubation of the  trachea.   Dg Chest Port 1 View 03/07/2015 Stable positioning of endotracheal and orogastric tubes. 2. Improved pulmonary edema since yesterday. 3. Right basilar pneumonia.  Dg Chest Port 1 View 03/06/2015 Worsening aeration of both lungs compared with previous exam.  Medical Consultants:  None  Other Consultants:  PT SLP  IAnti-Infectives:   Levaquin 11/10 --> 11/17       Discharge Exam: Filed Vitals:   03/14/15 0605  BP: 123/87  Pulse: 94  Temp: 98.1 F (36.7 C)   Resp: 20   Filed Vitals:   03/13/15 1400 03/13/15 2144 03/14/15 0605 03/14/15 0800  BP: 141/70 145/70 123/87   Pulse: 64 74 94   Temp: 98.3 F (36.8 C) 99.1 F (37.3 C) 98.1 F (36.7 C)   TempSrc: Oral Oral Axillary   Resp: 20 20 20    Height:      Weight:    72.8 kg (160 lb 7.9 oz)  SpO2: 96% 95% 98%     General: Pt is alert, follows commands appropriately, not in acute distress Cardiovascular: Regular rate and rhythm, no rubs, no gallops Respiratory: Clear to auscultation bilaterally, no wheezing, no crackles Abdominal: Soft, non tender, non distended, bowel sounds +, no guarding   Discharge Instructions  Discharge Instructions    Diet - low sodium heart healthy    Complete by:  As directed      Diet - low sodium heart healthy    Complete by:  As directed      Increase activity slowly    Complete by:  As directed      Increase activity slowly    Complete by:  As directed             Medication List    STOP taking these medications        amoxicillin 500 MG capsule  Commonly known as:  AMOXIL     buPROPion 150 MG 24 hr tablet  Commonly known as:  WELLBUTRIN XL     docusate sodium 100 MG capsule  Commonly known as:  COLACE     fluticasone 50 MCG/ACT nasal spray  Commonly known as:  FLONASE     FLUZONE HIGH-DOSE 0.5 ML Susy  Generic drug:  Influenza Vac Split High-Dose     naproxen sodium 220 MG tablet  Commonly known as:  ANAPROX     triamcinolone cream 0.1 %  Commonly known as:  KENALOG      TAKE these medications        albuterol (2.5 MG/3ML) 0.083% nebulizer solution  Commonly known as:  PROVENTIL  Take 3 mLs (2.5 mg total) by nebulization every 2 (two) hours as needed for wheezing or shortness of breath.     aspirin 81 MG tablet  Take 81 mg by mouth daily.     clopidogrel 75 MG tablet  Commonly known as:  PLAVIX  Take 1 tablet (75 mg total) by mouth daily.     furosemide 40 MG tablet  Commonly known as:  LASIX  Take 1 tablet (40 mg  total) by mouth daily.     isosorbide mononitrate 30 MG 24 hr tablet  Commonly known as:  IMDUR  Take 1 tablet (30 mg total) by mouth daily.     levofloxacin 500 MG tablet  Commonly known as:  LEVAQUIN  Take 1 tablet (500 mg total) by mouth daily. Take for three more days     nitroGLYCERIN 0.4 MG SL tablet  Commonly known as:  NITROSTAT  Place 0.4 mg under the tongue every 5 (five) minutes as needed for chest pain.     pantoprazole 40 MG tablet  Commonly known as:  PROTONIX  Take 1 tablet (40 mg total) by mouth daily.     polyethylene glycol powder powder  Commonly known as:  GLYCOLAX/MIRALAX  Take 0.5 Containers by mouth daily as needed for mild constipation.     potassium chloride 10 MEQ tablet  Commonly known as:  K-DUR  Take 2 tablets (20 mEq total) by mouth daily.     simvastatin 20 MG tablet  Commonly known as:  ZOCOR  Take 1 tablet (20 mg total) by mouth daily.             Follow-up Information    Follow up with [facility] R, NP On 04/04/2015.   Specialties:  Cardiology, Radiology   Why:  Cardiology Hospital follow-up on 04/04/2015 at 1:30PM   Contact information:   [address] [place] [address] [phone]       Follow up with Garret Reddish, MD.   Specialty:  Family Medicine   Contact information:   [address] [place] [address] [phone]       Follow up with Faye Ramsay, MD.   Specialty:  Internal Medicine   Contact information:   [address] [place]  [address] [phone]        The results of significant diagnostics from this hospitalization (including imaging, microbiology, ancillary and laboratory) are listed below for reference.     Microbiology: Recent Results (from the past 240 hour(s))  Culture, blood (routine x 2)     Status: None   Collection Time: 03/04/15  1:55 PM  Result Value Ref Range Status   Specimen Description BLOOD LEFT ANTECUBITAL  Final   Special  Requests IN PEDIATRIC BOTTLE 5CC  Final   Culture   Final    NO GROWTH 5 DAYS Performed at [facility]    Report Status 03/09/2015 FINAL  Final  Culture, blood (routine x 2)     Status: None   Collection Time: 03/04/15  2:51 PM  Result Value Ref Range Status   Specimen Description BLOOD RIGHT ANTECUBITAL  Final   Special Requests BOTTLES DRAWN AEROBIC AND ANAEROBIC 5CC EACH  Final   Culture   Final    NO GROWTH 5 DAYS Performed at [facility]    Report Status 03/09/2015 FINAL  Final  Urine culture     Status: None   Collection Time: 03/04/15  6:15 PM  Result Value Ref Range Status   Specimen Description URINE, RANDOM  Final   Special Requests NONE  Final   Culture   Final    80,000 COLONIES/ml ENTEROCOCCUS SPECIES Performed at [facility]    Report Status 03/07/2015 FINAL  Final   Organism ID, Bacteria ENTEROCOCCUS SPECIES  Final      Susceptibility   Enterococcus species - MIC*    AMPICILLIN <=2 SENSITIVE Sensitive     LEVOFLOXACIN 0.5 SENSITIVE Sensitive     NITROFURANTOIN <=16 SENSITIVE Sensitive     VANCOMYCIN 2 SENSITIVE Sensitive     * 80,000 COLONIES/ml ENTEROCOCCUS SPECIES  Culture, respiratory (NON-Expectorated)     Status: None   Collection Time: 03/06/15 12:56 PM  Result Value Ref Range Status   Specimen Description TRACHEAL ASPIRATE  Final   Special Requests NONE  Final   Gram Stain   Final    MODERATE WBC  PRESENT,BOTH PMN AND MONONUCLEAR NO SQUAMOUS EPITHELIAL CELLS SEEN NO ORGANISMS SEEN Performed at [facility]    Culture   Final    Non-Pathogenic Oropharyngeal-type Flora Isolated. Performed at [facility]    Report Status 03/08/2015 FINAL  Final  MRSA PCR Screening     Status: None   Collection Time: 03/06/15  1:00 PM  Result Value Ref Range Status   MRSA by PCR NEGATIVE NEGATIVE Final    Comment:        The GeneXpert MRSA Assay (FDA approved for NASAL specimens only), is one component of  a comprehensive MRSA colonization surveillance program. It is not intended to diagnose MRSA infection nor to guide or monitor treatment for MRSA infections.      Labs: Basic Metabolic Panel:  Recent Labs Lab 03/09/15 0350 03/10/15 0436 03/11/15 0540 03/12/15 0523 03/13/15 0506 03/14/15 0510  NA 141 140 140 138 138 136  K 3.0* 3.9 3.2* 3.5 3.6 3.7  CL 105 105 106 103 104 102  CO2 27 26 26 28 27 27   GLUCOSE 103* 108* 93 103* 98 101*  BUN 39* 35* 35* 38* 38* 39*  CREATININE 2.19* 2.02* 1.76* 1.92* 2.07* 2.00*  CALCIUM 8.8* 8.7* 8.8* 8.7* 8.4* 8.5*  MG 2.5*  --   --   --   --   --   PHOS 2.9 2.6  --   --   --   --    CBC:  Recent Labs Lab 03/10/15 0436 03/11/15 0540 03/12/15 0523 03/13/15 0506 03/14/15 0510  WBC 9.9 6.3 6.3 5.5 7.7  HGB 11.1* 10.8* 10.7* 10.6* 10.7*  HCT 34.4* 33.3* 34.0* 33.3* 33.4*  MCV 83.5 83.7 82.5 83.9 82.7  PLT 180 171 178 180 195   BNP (last 3 results)  Recent Labs  03/08/15 0345  BNP 1138.6*     SIGNED: Time coordinating discharge:  30 minutes  Faye Ramsay, MD  [facility] 03/14/2015, 9:57 AM Pager [phone]  If 7PM-7AM, please contact night-coverage [email or web address] Password [email or web address]

## 2015-03-14 NOTE — Progress Notes (Signed)
Physical Therapy Treatment Patient Details Name: Harold [name] MRN: [id number] DOB: 11-Oct-1928 Today's Date: 03/14/2015    History of Present Illness Pt admitted with dx of respiratory failure and hx of CAD, PVD, macular degenerations, CKD, CABG and Parkinsons    PT Comments    Progressing slowly with mobility. Continues to require +2 for mobility. Recommend SNF.  Follow Up Recommendations  SNF     Equipment Recommendations  None recommended by PT    Recommendations for Other Services       Precautions / Restrictions Precautions Precautions: Fall Precaution Comments: Visually impaired Restrictions Weight Bearing Restrictions: No    Mobility  Bed Mobility Overal bed mobility: Needs Assistance Bed Mobility: Supine to Sit     Supine to sit: Mod assist;HOB elevated     General bed mobility comments: Assist for trunk and bil LEs. Utilized bedpad for scooting, positioning.   Transfers Overall transfer level: Needs assistance Equipment used: Rolling walker (2 wheeled) Transfers: Sit to/from Stand Sit to Stand: Mod assist;+2 physical assistance;+2 safety/equipment;From elevated surface         General transfer comment: x2. Assist to rise, stabilize, control descent. Pt with difficulty flexing knees to allow for good BOS for sit to stand.   Ambulation/Gait Ambulation/Gait assistance: Min assist;+2 physical assistance;+2 safety/equipment Ambulation Distance (Feet): 75 Feet Assistive device: Rolling walker (2 wheeled) Gait Pattern/deviations: Decreased stride length;Step-through pattern;Trunk flexed;Shuffle     General Gait Details: cues for posture, pacing, step length, position from RW. Assist to stabilize pt and maneuver with walker. Followed closely with recliner.   Stairs            Wheelchair Mobility    Modified Rankin (Stroke Patients Only)       Balance           Standing balance support: Bilateral upper extremity supported;During  functional activity Standing balance-Leahy Scale: Poor                      Cognition Arousal/Alertness: Awake/alert Behavior During Therapy: WFL for tasks assessed/performed Overall Cognitive Status: Within Functional Limits for tasks assessed                      Exercises      General Comments        Pertinent Vitals/Pain Pain Assessment: Faces Faces Pain Scale: Hurts even more Pain Location: bil knees, ankles Pain Descriptors / Indicators: Sore Pain Intervention(s): Monitored during session;Repositioned    Home Living                      Prior Function            PT Goals (current goals can now be found in the care plan section) Progress towards PT goals: Progressing toward goals    Frequency  Min 3X/week    PT Plan Current plan remains appropriate    Co-evaluation             End of Session Equipment Utilized During Treatment: Gait belt Activity Tolerance: Patient tolerated treatment well Patient left: in chair;with call bell/phone within reach;with chair alarm set     Time: [id number] PT Time Calculation (min) (ACUTE ONLY): 21 min  Charges:  $Gait Training: 8-22 mins                    G Codes:      Weston Anna, MPT Pager: [phone]

## 2015-03-17 ENCOUNTER — Telehealth: Payer: Self-pay | Admitting: Family Medicine

## 2015-03-17 NOTE — Telephone Encounter (Signed)
He is not on medication as of my last notes on his problem list

## 2015-03-17 NOTE — Telephone Encounter (Signed)
See below

## 2015-03-17 NOTE — Telephone Encounter (Signed)
Francis from [facility] is calling pt was discharge from [place] and pt arrived with no parkinson  med. Fax name of medication to [phone] or has parkinson med been discontinued

## 2015-03-17 NOTE — Telephone Encounter (Signed)
Returned francis call and provided her with Dr.Tat phone number regarding pt Parkinsons medication.

## 2015-04-04 ENCOUNTER — Encounter: Payer: PRIVATE HEALTH INSURANCE | Admitting: Cardiology

## 2015-04-04 DIAGNOSIS — R0989 Other specified symptoms and signs involving the circulatory and respiratory systems: Secondary | ICD-10-CM

## 2015-04-04 NOTE — Progress Notes (Signed)
This encounter was created in error - please disregard.

## 2015-04-06 ENCOUNTER — Encounter: Payer: Self-pay | Admitting: Cardiology

## 2015-06-27 ENCOUNTER — Telehealth: Payer: Self-pay | Admitting: Family Medicine

## 2015-06-27 DIAGNOSIS — G20 Parkinson's disease: Secondary | ICD-10-CM

## 2015-06-27 NOTE — Telephone Encounter (Signed)
See below

## 2015-06-27 NOTE — Telephone Encounter (Signed)
Referral to Hospice placed.

## 2015-06-27 NOTE — Telephone Encounter (Signed)
Patient is in need of hospice care, he is declining rapidly.  He need an order and If patient qualify he will Dr hunter to be his attending provider, and they will do the symptom management. Please advise. [phone]

## 2015-06-27 NOTE — Telephone Encounter (Signed)
You may refer under Parkinson's. I am really not sure if he will be approved or not without seeing him but I am ok with being attending and having them help with symptom management

## 2015-09-01 ENCOUNTER — Telehealth: Payer: Self-pay

## 2015-09-01 NOTE — Telephone Encounter (Signed)
[name] from Hospice called stating that pt has malodorus, and cloudy urine with sediment and increased confusion. She is wanting a verbal order for a UA and an Urine Culture for pt.

## 2015-09-01 NOTE — Telephone Encounter (Signed)
Yes thanks 

## 2015-09-02 NOTE — Telephone Encounter (Signed)
Called and provided VO for pt to have urine culture and UA done.

## 2015-09-06 NOTE — Telephone Encounter (Signed)
Re shows >100k multiple bacterial morphotypes but UA with leukocyte esterase- high risk for UTI with symptoms.   Please ask hospice to treat him with keflex 250mg  twice a day for 10 days (low dose due to his history of CKD stage IV). If he does not improve- would ask for their physician's opinion

## 2015-09-07 NOTE — Telephone Encounter (Signed)
Lm on [employer] with below message.

## 2015-09-14 ENCOUNTER — Telehealth: Payer: Self-pay | Admitting: *Deleted

## 2015-09-14 NOTE — Telephone Encounter (Signed)
Error

## 2015-09-27 ENCOUNTER — Telehealth: Payer: Self-pay | Admitting: Family Medicine

## 2015-09-27 NOTE — Telephone Encounter (Signed)
Yes thanks, through hospice correct?

## 2015-09-27 NOTE — Telephone Encounter (Signed)
Ok to order 

## 2015-09-27 NOTE — Telephone Encounter (Signed)
He is having constipation. Over 1 week a go he's eating and drinking very little. Was wondering if they can get an order for Senna Plus to use 1 to 4 tablets once to twice daily.

## 2015-09-28 ENCOUNTER — Telehealth: Payer: Self-pay | Admitting: Family Medicine

## 2015-09-28 NOTE — Telephone Encounter (Signed)
Harold [name] was notified of instructions below.

## 2015-09-28 NOTE — Telephone Encounter (Signed)
Spoke to Amy with Hospice, verbal order given for Senna Plus to use 1 to 4 tablets once to twice daily as needed. Amy verbalized understanding.

## 2015-09-28 NOTE — Telephone Encounter (Signed)
If patient is declining them, I think it is reasonable to continue. He is on hospice - can be watched carefully and restarted if causing decreased quality of life

## 2015-09-28 NOTE — Telephone Encounter (Signed)
Harold [name] is calling pt is not having any edema. tonya would like to know if she can discontinue the lasix and potassium. Pt is declining

## 2015-09-28 NOTE — Telephone Encounter (Signed)
Please advise. Thanks.  

## 2015-10-05 ENCOUNTER — Telehealth: Payer: Self-pay | Admitting: Family Medicine

## 2015-10-05 NOTE — Telephone Encounter (Signed)
Can we change his status in epic to deceased?  I called family to offer condolences- spoke with daughter in law

## 2015-10-05 NOTE — Telephone Encounter (Signed)
Hospice would like you to know if you do not already, pt passed 10/03/15 at 8:30 am.

## 2015-10-06 NOTE — Telephone Encounter (Signed)
Status changed.

## 2015-10-29 DEATH — deceased

## 2016-04-30 IMAGING — DX DG CHEST 1V PORT
1 series · 1 of 1 positions shown · non-contrast
Comparison: Earlier today.

CLINICAL DATA: Intubated.

EXAM:
PORTABLE CHEST 1 VIEW

[chest ap]
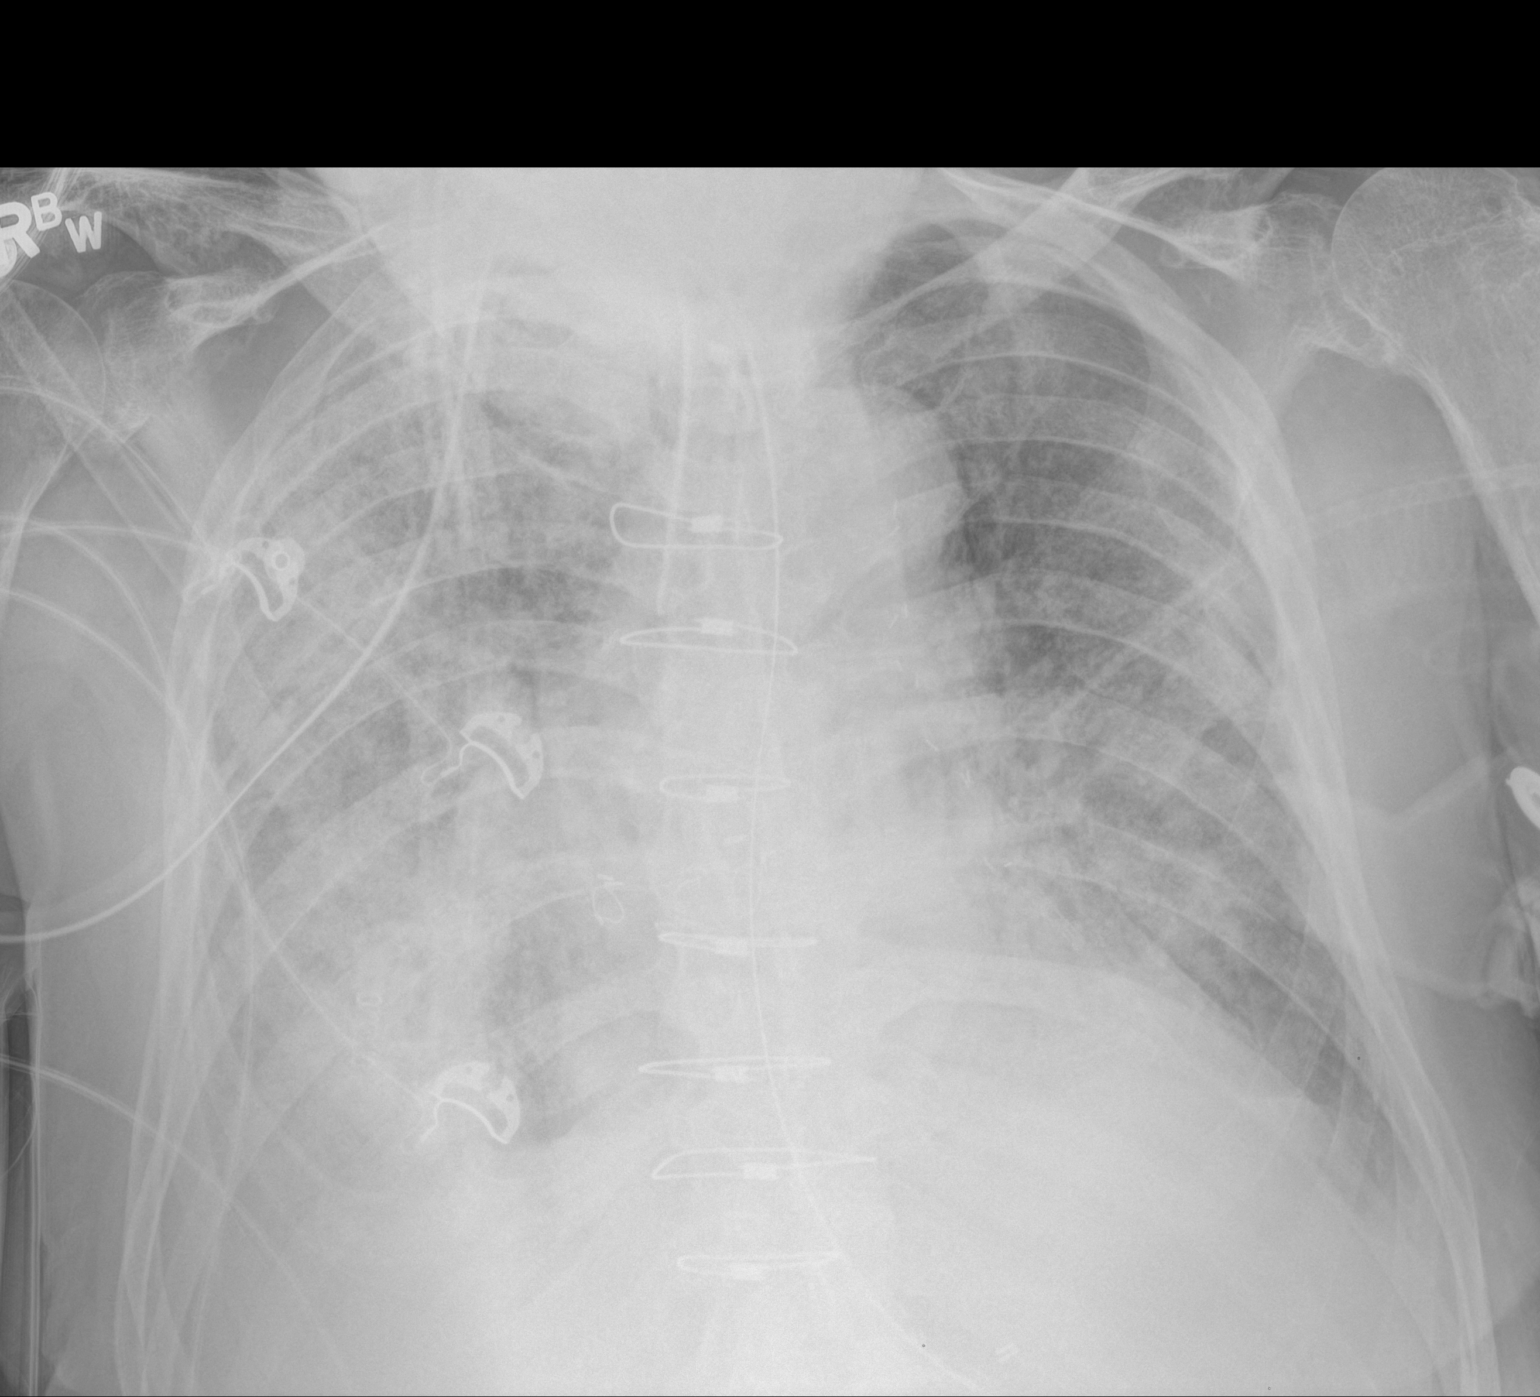

[1 of 1 positions shown; findings below may reference images not displayed]

FINDINGS: Interval endotracheal tube with its tip 9 mm above the carina.
Nasogastric tube extending into the stomach. Mild decrease in
airspace opacity in both lungs. Possible pleural fluid on the right.
Obscured heart borders with no gross change in size of the heart.
Stable post CABG changes.
IMPRESSION: 1. Endotracheal tube tip 9 mm above the carina. This could be
retracted 4 cm for better placement.
2. Mild decrease in extensive bilateral pneumonia or alveolar edema.
3. Probable right pleural effusion.
